# Patient Record
Sex: Female | Born: 1935 | Race: Black or African American | Hispanic: No | Marital: Married | Smoking: Never smoker
Health system: Southern US, Community
[De-identification: ages and names within clinical notes are randomized; demographics above are authoritative.]

## PROBLEM LIST (undated history)

## (undated) DIAGNOSIS — E785 Hyperlipidemia, unspecified: Secondary | ICD-10-CM

## (undated) DIAGNOSIS — C50912 Malignant neoplasm of unspecified site of left female breast: Principal | ICD-10-CM

## (undated) DIAGNOSIS — M1990 Unspecified osteoarthritis, unspecified site: Secondary | ICD-10-CM

## (undated) DIAGNOSIS — C50919 Malignant neoplasm of unspecified site of unspecified female breast: Secondary | ICD-10-CM

## (undated) DIAGNOSIS — Z5189 Encounter for other specified aftercare: Secondary | ICD-10-CM

## (undated) DIAGNOSIS — M545 Low back pain, unspecified: Secondary | ICD-10-CM

## (undated) DIAGNOSIS — M810 Age-related osteoporosis without current pathological fracture: Secondary | ICD-10-CM

## (undated) DIAGNOSIS — C801 Malignant (primary) neoplasm, unspecified: Secondary | ICD-10-CM

## (undated) DIAGNOSIS — I10 Essential (primary) hypertension: Secondary | ICD-10-CM

## (undated) DIAGNOSIS — IMO0001 Reserved for inherently not codable concepts without codable children: Secondary | ICD-10-CM

## (undated) DIAGNOSIS — K219 Gastro-esophageal reflux disease without esophagitis: Secondary | ICD-10-CM

## (undated) DIAGNOSIS — D0510 Intraductal carcinoma in situ of unspecified breast: Secondary | ICD-10-CM

## (undated) DIAGNOSIS — G8929 Other chronic pain: Secondary | ICD-10-CM

## (undated) HISTORY — PX: ROTATOR CUFF REPAIR: SHX139

## (undated) HISTORY — PX: MASTECTOMY: SHX3

## (undated) HISTORY — DX: Encounter for other specified aftercare: Z51.89

## (undated) HISTORY — DX: Age-related osteoporosis without current pathological fracture: M81.0

## (undated) HISTORY — DX: Gastro-esophageal reflux disease without esophagitis: K21.9

## (undated) HISTORY — DX: Hyperlipidemia, unspecified: E78.5

## (undated) HISTORY — DX: Unspecified osteoarthritis, unspecified site: M19.90

## (undated) HISTORY — DX: Malignant (primary) neoplasm, unspecified: C80.1

## (undated) HISTORY — DX: Other chronic pain: G89.29

## (undated) HISTORY — DX: Intraductal carcinoma in situ of unspecified breast: D05.10

## (undated) HISTORY — DX: Essential (primary) hypertension: I10

## (undated) HISTORY — DX: Reserved for inherently not codable concepts without codable children: IMO0001

## (undated) HISTORY — DX: Low back pain, unspecified: M54.50

## (undated) HISTORY — PX: TOTAL ABDOMINAL HYSTERECTOMY: SHX209

## (undated) HISTORY — DX: Low back pain: M54.5

## (undated) HISTORY — DX: Malignant neoplasm of unspecified site of left female breast: C50.912

---

## 1999-11-09 ENCOUNTER — Encounter: Admission: RE | Admit: 1999-11-09 | Discharge: 1999-11-09 | Payer: Self-pay | Admitting: Family Medicine

## 1999-11-09 ENCOUNTER — Encounter: Payer: Self-pay | Admitting: Family Medicine

## 2001-02-20 ENCOUNTER — Encounter: Payer: Self-pay | Admitting: Family Medicine

## 2001-02-20 ENCOUNTER — Encounter: Admission: RE | Admit: 2001-02-20 | Discharge: 2001-02-20 | Payer: Self-pay | Admitting: Family Medicine

## 2001-08-22 ENCOUNTER — Encounter: Payer: Self-pay | Admitting: Orthopedic Surgery

## 2001-08-22 ENCOUNTER — Encounter: Admission: RE | Admit: 2001-08-22 | Discharge: 2001-08-22 | Payer: Self-pay | Admitting: Orthopedic Surgery

## 2001-10-05 ENCOUNTER — Encounter: Admission: RE | Admit: 2001-10-05 | Discharge: 2001-10-05 | Payer: Self-pay | Admitting: Orthopedic Surgery

## 2001-10-05 ENCOUNTER — Encounter: Payer: Self-pay | Admitting: Orthopedic Surgery

## 2001-11-08 ENCOUNTER — Encounter: Payer: Self-pay | Admitting: Orthopedic Surgery

## 2001-11-08 ENCOUNTER — Encounter: Admission: RE | Admit: 2001-11-08 | Discharge: 2001-11-08 | Payer: Self-pay | Admitting: Orthopedic Surgery

## 2002-03-01 ENCOUNTER — Encounter: Admission: RE | Admit: 2002-03-01 | Discharge: 2002-03-01 | Payer: Self-pay | Admitting: Family Medicine

## 2002-03-01 ENCOUNTER — Encounter: Payer: Self-pay | Admitting: Family Medicine

## 2002-05-08 ENCOUNTER — Encounter: Payer: Self-pay | Admitting: Family Medicine

## 2002-05-08 ENCOUNTER — Encounter: Admission: RE | Admit: 2002-05-08 | Discharge: 2002-05-08 | Payer: Self-pay | Admitting: Family Medicine

## 2002-12-23 ENCOUNTER — Encounter: Admission: RE | Admit: 2002-12-23 | Discharge: 2002-12-23 | Payer: Self-pay | Admitting: Family Medicine

## 2003-05-18 ENCOUNTER — Ambulatory Visit (HOSPITAL_COMMUNITY): Admission: RE | Admit: 2003-05-18 | Discharge: 2003-05-18 | Payer: Self-pay | Admitting: Orthopedic Surgery

## 2003-11-03 ENCOUNTER — Encounter: Admission: RE | Admit: 2003-11-03 | Discharge: 2003-11-03 | Payer: Self-pay | Admitting: Family Medicine

## 2004-04-28 ENCOUNTER — Encounter: Admission: RE | Admit: 2004-04-28 | Discharge: 2004-04-28 | Payer: Self-pay | Admitting: Family Medicine

## 2004-05-17 ENCOUNTER — Inpatient Hospital Stay (HOSPITAL_COMMUNITY): Admission: RE | Admit: 2004-05-17 | Discharge: 2004-05-18 | Payer: Self-pay | Admitting: Neurosurgery

## 2004-11-02 ENCOUNTER — Encounter: Admission: RE | Admit: 2004-11-02 | Discharge: 2004-11-22 | Payer: Self-pay | Admitting: Neurosurgery

## 2005-09-07 ENCOUNTER — Encounter: Admission: RE | Admit: 2005-09-07 | Discharge: 2005-09-07 | Payer: Self-pay | Admitting: Family Medicine

## 2005-09-15 ENCOUNTER — Ambulatory Visit (HOSPITAL_COMMUNITY): Admission: RE | Admit: 2005-09-15 | Discharge: 2005-09-15 | Payer: Self-pay | Admitting: Neurosurgery

## 2005-11-24 ENCOUNTER — Emergency Department (HOSPITAL_COMMUNITY): Admission: EM | Admit: 2005-11-24 | Discharge: 2005-11-24 | Payer: Self-pay | Admitting: Family Medicine

## 2006-08-14 ENCOUNTER — Ambulatory Visit: Payer: Self-pay | Admitting: Gastroenterology

## 2006-08-25 ENCOUNTER — Encounter: Payer: Self-pay | Admitting: Gastroenterology

## 2006-08-25 ENCOUNTER — Ambulatory Visit: Payer: Self-pay | Admitting: Gastroenterology

## 2006-10-04 ENCOUNTER — Encounter: Admission: RE | Admit: 2006-10-04 | Discharge: 2006-10-04 | Payer: Self-pay | Admitting: Family Medicine

## 2006-11-23 ENCOUNTER — Encounter (INDEPENDENT_AMBULATORY_CARE_PROVIDER_SITE_OTHER): Payer: Self-pay | Admitting: Radiology

## 2006-11-23 ENCOUNTER — Encounter: Admission: RE | Admit: 2006-11-23 | Discharge: 2006-11-23 | Payer: Self-pay | Admitting: Family Medicine

## 2006-12-21 ENCOUNTER — Encounter: Admission: RE | Admit: 2006-12-21 | Discharge: 2006-12-21 | Payer: Self-pay | Admitting: General Surgery

## 2006-12-25 ENCOUNTER — Encounter: Admission: RE | Admit: 2006-12-25 | Discharge: 2006-12-25 | Payer: Self-pay | Admitting: General Surgery

## 2006-12-25 ENCOUNTER — Ambulatory Visit (HOSPITAL_BASED_OUTPATIENT_CLINIC_OR_DEPARTMENT_OTHER): Admission: RE | Admit: 2006-12-25 | Discharge: 2006-12-25 | Payer: Self-pay | Admitting: General Surgery

## 2006-12-25 ENCOUNTER — Encounter (INDEPENDENT_AMBULATORY_CARE_PROVIDER_SITE_OTHER): Payer: Self-pay | Admitting: General Surgery

## 2007-01-04 ENCOUNTER — Ambulatory Visit: Payer: Self-pay | Admitting: Oncology

## 2007-01-19 ENCOUNTER — Ambulatory Visit: Admission: RE | Admit: 2007-01-19 | Discharge: 2007-02-05 | Payer: Self-pay | Admitting: Radiation Oncology

## 2007-01-23 LAB — CBC WITH DIFFERENTIAL/PLATELET
BASO%: 0.9 % (ref 0.0–2.0)
Basophils Absolute: 0 10*3/uL (ref 0.0–0.1)
EOS%: 2.7 % (ref 0.0–7.0)
Eosinophils Absolute: 0.1 10*3/uL (ref 0.0–0.5)
HCT: 38.3 % (ref 34.8–46.6)
HGB: 13.4 g/dL (ref 11.6–15.9)
LYMPH%: 33.4 % (ref 14.0–48.0)
MCH: 31.1 pg (ref 26.0–34.0)
MCHC: 34.9 g/dL (ref 32.0–36.0)
MCV: 89.1 fL (ref 81.0–101.0)
MONO#: 0.5 10*3/uL (ref 0.1–0.9)
MONO%: 10.4 % (ref 0.0–13.0)
NEUT#: 2.6 10*3/uL (ref 1.5–6.5)
NEUT%: 52.6 % (ref 39.6–76.8)
Platelets: 269 10*3/uL (ref 145–400)
RBC: 4.3 10*6/uL (ref 3.70–5.32)
RDW: 12.8 % (ref 11.3–14.5)
WBC: 4.9 10*3/uL (ref 3.9–10.0)
lymph#: 1.6 10*3/uL (ref 0.9–3.3)

## 2007-01-23 LAB — COMPREHENSIVE METABOLIC PANEL
ALT: 13 U/L (ref 0–35)
AST: 17 U/L (ref 0–37)
Albumin: 4.2 g/dL (ref 3.5–5.2)
Alkaline Phosphatase: 73 U/L (ref 39–117)
BUN: 9 mg/dL (ref 6–23)
CO2: 28 mEq/L (ref 19–32)
Calcium: 9.5 mg/dL (ref 8.4–10.5)
Chloride: 103 mEq/L (ref 96–112)
Creatinine, Ser: 0.97 mg/dL (ref 0.40–1.20)
Glucose, Bld: 103 mg/dL — ABNORMAL HIGH (ref 70–99)
Potassium: 3.7 mEq/L (ref 3.5–5.3)
Sodium: 141 mEq/L (ref 135–145)
Total Bilirubin: 0.4 mg/dL (ref 0.3–1.2)
Total Protein: 7.1 g/dL (ref 6.0–8.3)

## 2007-01-23 LAB — LACTATE DEHYDROGENASE: LDH: 276 U/L — ABNORMAL HIGH (ref 94–250)

## 2007-02-15 HISTORY — PX: BACK SURGERY: SHX140

## 2007-02-15 HISTORY — PX: BREAST SURGERY: SHX581

## 2007-04-18 ENCOUNTER — Encounter: Admission: RE | Admit: 2007-04-18 | Discharge: 2007-04-18 | Payer: Self-pay | Admitting: Neurosurgery

## 2007-07-20 ENCOUNTER — Ambulatory Visit: Payer: Self-pay | Admitting: Oncology

## 2007-07-26 ENCOUNTER — Inpatient Hospital Stay (HOSPITAL_COMMUNITY): Admission: RE | Admit: 2007-07-26 | Discharge: 2007-07-30 | Payer: Self-pay | Admitting: Neurosurgery

## 2007-09-16 ENCOUNTER — Emergency Department (HOSPITAL_COMMUNITY): Admission: EM | Admit: 2007-09-16 | Discharge: 2007-09-17 | Payer: Self-pay | Admitting: Emergency Medicine

## 2007-11-26 ENCOUNTER — Encounter: Admission: RE | Admit: 2007-11-26 | Discharge: 2007-11-26 | Payer: Self-pay | Admitting: General Surgery

## 2007-12-04 ENCOUNTER — Encounter: Admission: RE | Admit: 2007-12-04 | Discharge: 2007-12-04 | Payer: Self-pay | Admitting: General Surgery

## 2007-12-07 ENCOUNTER — Encounter: Admission: RE | Admit: 2007-12-07 | Discharge: 2007-12-07 | Payer: Self-pay | Admitting: General Surgery

## 2007-12-07 ENCOUNTER — Encounter (INDEPENDENT_AMBULATORY_CARE_PROVIDER_SITE_OTHER): Payer: Self-pay | Admitting: Radiology

## 2007-12-27 ENCOUNTER — Ambulatory Visit (HOSPITAL_COMMUNITY): Admission: RE | Admit: 2007-12-27 | Discharge: 2007-12-27 | Payer: Self-pay | Admitting: General Surgery

## 2007-12-27 ENCOUNTER — Encounter (INDEPENDENT_AMBULATORY_CARE_PROVIDER_SITE_OTHER): Payer: Self-pay | Admitting: General Surgery

## 2007-12-27 ENCOUNTER — Encounter: Admission: RE | Admit: 2007-12-27 | Discharge: 2007-12-27 | Payer: Self-pay | Admitting: General Surgery

## 2008-01-07 ENCOUNTER — Ambulatory Visit (HOSPITAL_BASED_OUTPATIENT_CLINIC_OR_DEPARTMENT_OTHER): Admission: RE | Admit: 2008-01-07 | Discharge: 2008-01-07 | Payer: Self-pay | Admitting: General Surgery

## 2008-01-07 ENCOUNTER — Encounter (INDEPENDENT_AMBULATORY_CARE_PROVIDER_SITE_OTHER): Payer: Self-pay | Admitting: General Surgery

## 2008-01-16 ENCOUNTER — Ambulatory Visit (HOSPITAL_COMMUNITY): Admission: RE | Admit: 2008-01-16 | Discharge: 2008-01-16 | Payer: Self-pay | Admitting: General Surgery

## 2008-01-16 ENCOUNTER — Encounter (INDEPENDENT_AMBULATORY_CARE_PROVIDER_SITE_OTHER): Payer: Self-pay | Admitting: General Surgery

## 2008-02-13 ENCOUNTER — Ambulatory Visit: Payer: Self-pay | Admitting: Oncology

## 2008-02-14 ENCOUNTER — Ambulatory Visit: Admission: RE | Admit: 2008-02-14 | Discharge: 2008-04-17 | Payer: Self-pay | Admitting: Radiation Oncology

## 2008-02-18 LAB — COMPREHENSIVE METABOLIC PANEL
ALT: 12 U/L (ref 0–35)
AST: 15 U/L (ref 0–37)
Albumin: 4.6 g/dL (ref 3.5–5.2)
Alkaline Phosphatase: 74 U/L (ref 39–117)
BUN: 13 mg/dL (ref 6–23)
CO2: 28 mEq/L (ref 19–32)
Calcium: 10.1 mg/dL (ref 8.4–10.5)
Chloride: 102 mEq/L (ref 96–112)
Creatinine, Ser: 0.8 mg/dL (ref 0.40–1.20)
Glucose, Bld: 98 mg/dL (ref 70–99)
Potassium: 3.7 mEq/L (ref 3.5–5.3)
Sodium: 140 mEq/L (ref 135–145)
Total Bilirubin: 0.4 mg/dL (ref 0.3–1.2)
Total Protein: 7.3 g/dL (ref 6.0–8.3)

## 2008-02-18 LAB — CBC WITH DIFFERENTIAL/PLATELET
BASO%: 0.8 % (ref 0.0–2.0)
Basophils Absolute: 0 10*3/uL (ref 0.0–0.1)
EOS%: 0.7 % (ref 0.0–7.0)
Eosinophils Absolute: 0 10*3/uL (ref 0.0–0.5)
HCT: 35.8 % (ref 34.8–46.6)
HGB: 12.2 g/dL (ref 11.6–15.9)
LYMPH%: 37.6 % (ref 14.0–48.0)
MCH: 30 pg (ref 26.0–34.0)
MCHC: 34 g/dL (ref 32.0–36.0)
MCV: 88 fL (ref 81.0–101.0)
MONO#: 0.5 10*3/uL (ref 0.1–0.9)
MONO%: 11.8 % (ref 0.0–13.0)
NEUT#: 2.3 10*3/uL (ref 1.5–6.5)
NEUT%: 49.1 % (ref 39.6–76.8)
Platelets: 315 10*3/uL (ref 145–400)
RBC: 4.07 10*6/uL (ref 3.70–5.32)
RDW: 14.1 % (ref 11.3–14.5)
WBC: 4.6 10*3/uL (ref 3.9–10.0)
lymph#: 1.7 10*3/uL (ref 0.9–3.3)

## 2008-02-18 LAB — LACTATE DEHYDROGENASE: LDH: 155 U/L (ref 94–250)

## 2008-04-16 ENCOUNTER — Ambulatory Visit: Payer: Self-pay | Admitting: Oncology

## 2008-04-18 LAB — COMPREHENSIVE METABOLIC PANEL
ALT: 13 U/L (ref 0–35)
AST: 17 U/L (ref 0–37)
Albumin: 4.2 g/dL (ref 3.5–5.2)
Alkaline Phosphatase: 72 U/L (ref 39–117)
BUN: 11 mg/dL (ref 6–23)
CO2: 31 mEq/L (ref 19–32)
Calcium: 9.8 mg/dL (ref 8.4–10.5)
Chloride: 103 mEq/L (ref 96–112)
Creatinine, Ser: 0.7 mg/dL (ref 0.40–1.20)
Glucose, Bld: 89 mg/dL (ref 70–99)
Potassium: 3.6 mEq/L (ref 3.5–5.3)
Sodium: 143 mEq/L (ref 135–145)
Total Bilirubin: 0.5 mg/dL (ref 0.3–1.2)
Total Protein: 6.9 g/dL (ref 6.0–8.3)

## 2008-04-18 LAB — CBC WITH DIFFERENTIAL/PLATELET
BASO%: 0.9 % (ref 0.0–2.0)
Basophils Absolute: 0 10*3/uL (ref 0.0–0.1)
EOS%: 4 % (ref 0.0–7.0)
Eosinophils Absolute: 0.1 10*3/uL (ref 0.0–0.5)
HCT: 37.3 % (ref 34.8–46.6)
HGB: 12.7 g/dL (ref 11.6–15.9)
LYMPH%: 27.2 % (ref 14.0–49.7)
MCH: 28.9 pg (ref 25.1–34.0)
MCHC: 34 g/dL (ref 31.5–36.0)
MCV: 85 fL (ref 79.5–101.0)
MONO#: 0.6 10*3/uL (ref 0.1–0.9)
MONO%: 17 % — ABNORMAL HIGH (ref 0.0–14.0)
NEUT#: 1.6 10*3/uL (ref 1.5–6.5)
NEUT%: 50.9 % (ref 38.4–76.8)
Platelets: 206 10*3/uL (ref 145–400)
RBC: 4.39 10*6/uL (ref 3.70–5.45)
RDW: 14 % (ref 11.2–14.5)
WBC: 3.2 10*3/uL — ABNORMAL LOW (ref 3.9–10.3)
lymph#: 0.9 10*3/uL (ref 0.9–3.3)

## 2008-04-18 LAB — LACTATE DEHYDROGENASE: LDH: 151 U/L (ref 94–250)

## 2008-05-30 LAB — CBC WITH DIFFERENTIAL/PLATELET
BASO%: 0.8 % (ref 0.0–2.0)
Basophils Absolute: 0 10*3/uL (ref 0.0–0.1)
EOS%: 2.8 % (ref 0.0–7.0)
Eosinophils Absolute: 0.1 10*3/uL (ref 0.0–0.5)
HCT: 35.5 % (ref 34.8–46.6)
HGB: 12.2 g/dL (ref 11.6–15.9)
LYMPH%: 35 % (ref 14.0–49.7)
MCH: 30.2 pg (ref 25.1–34.0)
MCHC: 34.4 g/dL (ref 31.5–36.0)
MCV: 87.8 fL (ref 79.5–101.0)
MONO#: 0.5 10*3/uL (ref 0.1–0.9)
MONO%: 14.9 % — ABNORMAL HIGH (ref 0.0–14.0)
NEUT#: 1.6 10*3/uL (ref 1.5–6.5)
NEUT%: 46.5 % (ref 38.4–76.8)
Platelets: 222 10*3/uL (ref 145–400)
RBC: 4.05 10*6/uL (ref 3.70–5.45)
RDW: 15 % — ABNORMAL HIGH (ref 11.2–14.5)
WBC: 3.5 10*3/uL — ABNORMAL LOW (ref 3.9–10.3)
lymph#: 1.2 10*3/uL (ref 0.9–3.3)

## 2008-08-19 ENCOUNTER — Ambulatory Visit: Payer: Self-pay | Admitting: Oncology

## 2008-08-21 LAB — CBC WITH DIFFERENTIAL/PLATELET
BASO%: 0.9 % (ref 0.0–2.0)
Basophils Absolute: 0 10*3/uL (ref 0.0–0.1)
EOS%: 1.8 % (ref 0.0–7.0)
Eosinophils Absolute: 0.1 10*3/uL (ref 0.0–0.5)
HCT: 36.7 % (ref 34.8–46.6)
HGB: 12.8 g/dL (ref 11.6–15.9)
LYMPH%: 28.9 % (ref 14.0–49.7)
MCH: 31.5 pg (ref 25.1–34.0)
MCHC: 34.7 g/dL (ref 31.5–36.0)
MCV: 90.8 fL (ref 79.5–101.0)
MONO#: 0.5 10*3/uL (ref 0.1–0.9)
MONO%: 15.8 % — ABNORMAL HIGH (ref 0.0–14.0)
NEUT#: 1.6 10*3/uL (ref 1.5–6.5)
NEUT%: 52.6 % (ref 38.4–76.8)
Platelets: 198 10*3/uL (ref 145–400)
RBC: 4.04 10*6/uL (ref 3.70–5.45)
RDW: 14.4 % (ref 11.2–14.5)
WBC: 3 10*3/uL — ABNORMAL LOW (ref 3.9–10.3)
lymph#: 0.9 10*3/uL (ref 0.9–3.3)

## 2008-08-21 LAB — COMPREHENSIVE METABOLIC PANEL
ALT: 10 U/L (ref 0–35)
AST: 15 U/L (ref 0–37)
Albumin: 4.4 g/dL (ref 3.5–5.2)
Alkaline Phosphatase: 68 U/L (ref 39–117)
BUN: 10 mg/dL (ref 6–23)
CO2: 28 mEq/L (ref 19–32)
Calcium: 9.5 mg/dL (ref 8.4–10.5)
Chloride: 103 mEq/L (ref 96–112)
Creatinine, Ser: 0.74 mg/dL (ref 0.40–1.20)
Glucose, Bld: 98 mg/dL (ref 70–99)
Potassium: 3.6 mEq/L (ref 3.5–5.3)
Sodium: 141 mEq/L (ref 135–145)
Total Bilirubin: 0.6 mg/dL (ref 0.3–1.2)
Total Protein: 6.9 g/dL (ref 6.0–8.3)

## 2008-08-21 LAB — LACTATE DEHYDROGENASE: LDH: 155 U/L (ref 94–250)

## 2008-11-26 ENCOUNTER — Encounter: Admission: RE | Admit: 2008-11-26 | Discharge: 2008-11-26 | Payer: Self-pay | Admitting: General Surgery

## 2009-02-19 ENCOUNTER — Ambulatory Visit: Payer: Self-pay | Admitting: Oncology

## 2009-02-23 LAB — COMPREHENSIVE METABOLIC PANEL
ALT: 14 U/L (ref 0–35)
AST: 16 U/L (ref 0–37)
Albumin: 4.5 g/dL (ref 3.5–5.2)
Alkaline Phosphatase: 71 U/L (ref 39–117)
BUN: 8 mg/dL (ref 6–23)
CO2: 29 mEq/L (ref 19–32)
Calcium: 9.6 mg/dL (ref 8.4–10.5)
Chloride: 102 mEq/L (ref 96–112)
Creatinine, Ser: 0.74 mg/dL (ref 0.40–1.20)
Glucose, Bld: 97 mg/dL (ref 70–99)
Potassium: 3.4 mEq/L — ABNORMAL LOW (ref 3.5–5.3)
Sodium: 141 mEq/L (ref 135–145)
Total Bilirubin: 0.6 mg/dL (ref 0.3–1.2)
Total Protein: 6.9 g/dL (ref 6.0–8.3)

## 2009-02-23 LAB — CBC WITH DIFFERENTIAL/PLATELET
BASO%: 1 % (ref 0.0–2.0)
Basophils Absolute: 0 10*3/uL (ref 0.0–0.1)
EOS%: 2.7 % (ref 0.0–7.0)
Eosinophils Absolute: 0.1 10*3/uL (ref 0.0–0.5)
HCT: 39.5 % (ref 34.8–46.6)
HGB: 13.7 g/dL (ref 11.6–15.9)
LYMPH%: 35.7 % (ref 14.0–49.7)
MCH: 32.2 pg (ref 25.1–34.0)
MCHC: 34.8 g/dL (ref 31.5–36.0)
MCV: 92.7 fL (ref 79.5–101.0)
MONO#: 0.5 10*3/uL (ref 0.1–0.9)
MONO%: 13.4 % (ref 0.0–14.0)
NEUT#: 1.7 10*3/uL (ref 1.5–6.5)
NEUT%: 47.2 % (ref 38.4–76.8)
Platelets: 232 10*3/uL (ref 145–400)
RBC: 4.26 10*6/uL (ref 3.70–5.45)
RDW: 13.6 % (ref 11.2–14.5)
WBC: 3.6 10*3/uL — ABNORMAL LOW (ref 3.9–10.3)
lymph#: 1.3 10*3/uL (ref 0.9–3.3)

## 2009-02-23 LAB — LACTATE DEHYDROGENASE: LDH: 179 U/L (ref 94–250)

## 2009-08-20 ENCOUNTER — Ambulatory Visit: Payer: Self-pay | Admitting: Oncology

## 2009-11-27 ENCOUNTER — Encounter: Admission: RE | Admit: 2009-11-27 | Discharge: 2009-11-27 | Payer: Self-pay | Admitting: Family Medicine

## 2010-02-23 ENCOUNTER — Ambulatory Visit: Payer: Self-pay | Admitting: Oncology

## 2010-02-25 LAB — CBC WITH DIFFERENTIAL/PLATELET
BASO%: 0.9 % (ref 0.0–2.0)
Basophils Absolute: 0 10*3/uL (ref 0.0–0.1)
EOS%: 2.4 % (ref 0.0–7.0)
Eosinophils Absolute: 0.1 10*3/uL (ref 0.0–0.5)
HCT: 36.7 % (ref 34.8–46.6)
HGB: 12.7 g/dL (ref 11.6–15.9)
LYMPH%: 32.7 % (ref 14.0–49.7)
MCH: 31.6 pg (ref 25.1–34.0)
MCHC: 34.6 g/dL (ref 31.5–36.0)
MCV: 91.4 fL (ref 79.5–101.0)
MONO#: 0.5 10*3/uL (ref 0.1–0.9)
MONO%: 12.5 % (ref 0.0–14.0)
NEUT#: 2.1 10*3/uL (ref 1.5–6.5)
NEUT%: 51.5 % (ref 38.4–76.8)
Platelets: 238 10*3/uL (ref 145–400)
RBC: 4.01 10*6/uL (ref 3.70–5.45)
RDW: 14 % (ref 11.2–14.5)
WBC: 4.1 10*3/uL (ref 3.9–10.3)
lymph#: 1.3 10*3/uL (ref 0.9–3.3)

## 2010-02-25 LAB — COMPREHENSIVE METABOLIC PANEL
ALT: 12 U/L (ref 0–35)
AST: 18 U/L (ref 0–37)
Albumin: 4.6 g/dL (ref 3.5–5.2)
Alkaline Phosphatase: 63 U/L (ref 39–117)
BUN: 13 mg/dL (ref 6–23)
CO2: 31 mEq/L (ref 19–32)
Calcium: 9.6 mg/dL (ref 8.4–10.5)
Chloride: 100 mEq/L (ref 96–112)
Creatinine, Ser: 0.84 mg/dL (ref 0.40–1.20)
Glucose, Bld: 101 mg/dL — ABNORMAL HIGH (ref 70–99)
Potassium: 3.8 mEq/L (ref 3.5–5.3)
Sodium: 139 mEq/L (ref 135–145)
Total Bilirubin: 0.5 mg/dL (ref 0.3–1.2)
Total Protein: 7.2 g/dL (ref 6.0–8.3)

## 2010-02-25 LAB — LACTATE DEHYDROGENASE: LDH: 168 U/L (ref 94–250)

## 2010-03-07 ENCOUNTER — Encounter: Payer: Self-pay | Admitting: General Surgery

## 2010-06-14 ENCOUNTER — Inpatient Hospital Stay (INDEPENDENT_AMBULATORY_CARE_PROVIDER_SITE_OTHER)
Admission: RE | Admit: 2010-06-14 | Discharge: 2010-06-14 | Disposition: A | Discharge status: Home / Self Care | Payer: Medicare Other | Source: Ambulatory Visit | Attending: Family Medicine | Admitting: Family Medicine

## 2010-06-14 DIAGNOSIS — J069 Acute upper respiratory infection, unspecified: Secondary | ICD-10-CM

## 2010-06-14 LAB — POCT RAPID STREP A (OFFICE): Streptococcus, Group A Screen (Direct): NEGATIVE

## 2010-06-29 NOTE — Op Note (Signed)
Judy [name], Judy [name]                  ACCOUNT NO.:  [id number]   MEDICAL RECORD NO.:  [id number]          PATIENT TYPE:  AMB   LOCATION:  DSC                          FACILITY:  [facility]   PHYSICIAN:  Lennie Muckle, MD      DATE OF BIRTH:  1930/11/18   DATE OF PROCEDURE:  01/07/2008  DATE OF DISCHARGE:                               OPERATIVE REPORT   PREOPERATIVE DIAGNOSIS:  Ductal carcinoma in situ, left breast, status  post excisional biopsy with positive margin.   POSTOPERATIVE DIAGNOSIS:  Ductal carcinoma in situ, left breast, status  post excisional biopsy with positive margin.   PROCEDURE:  Re-excision, left breast tissue.   SURGEON:  Lennie Muckle, MD   ASSISTANT:  General endotracheal anesthesia.   FINDINGS:  Seroma in the previous excisional biopsy cavity.  Specimen  was excised, oriented with long lateral and short superior.   INDICATIONS FOR PROCEDURE:  Ms. Schussler is a 77 year old female who had a  lumpectomy by Dr. Lorelee New approximately 1 year ago.  She had a  followup mammogram, which showed new calcifications of the left breast.  Core biopsy was inconclusive, therefore I took her to the operating room  for an open biopsy using a needle as localization.  The pathology did  reveal DCIS, high-grade with necrosis.  It did extend into the medial  margin, close margin posterior and lateral, less than 0.1 cm.  I talked  to the patient about for performing re-excision of the left breast  tissue and she would need breast radiation.  I did offer mastectomy.  However, we felt we could treat this with a lumpectomy.  Informed  consent was obtained prior to the procedure.   DETAILS OF PROCEDURE:  Ms. Baker was identified in the preop holding  area.  Her left breast was marked by me.  She received 2 g of Kefzol and  was taken to the operating.  Once in the operating room, she was placed  in supine position.  After administration of general endotracheal  anesthesia, her left  breast was prepped and draped in the usual sterile  fashion.  A time-out procedure was obtained indicating patient and  procedure were performed.  I placed an incision directly through the  previous incision site.  Subcutaneous tissue was divided with  electrocautery.  I was able to encounter the previous biopsy cavity.  Serous fluid was evacuated.  I then used Allis clamp and re-excised the  breast tissue focusing medially, laterally, and posteriorly.  I was able  to incorporate the clips from the previous biopsy.  I marked this  specimen oriented long lateral and short with superior on the suture.  I  did mark this specimen deep with a short double suture.  I went onto the  chest wall just above the fascia of the pectoralis muscle.  I then  irrigated the breast tissue.  There was a small vessel and counted it  medially and superiorly.  This was tied off with a 3-0 Vicryl suture.  I  then reexcised the smaller portion around 5  o'clock using  electrocautery.  This was also oriented with the suture mark on the most  lateral border.  The breast cavity was irrigated.  A small amount oozing  was controlled with electrocautery.  Upon final inspection, there  appeared to be no bleeding. I did mark the cavity with clips to identify  the outer borders.  I then closed this in layered fashion using a 3-0  Vicryl suture.  Skin was closed with 4-0 Monocryl.  I then injected 30  mL of Marcaine into the breast excision site.  Dermabond was placed for  final dressing.  The patient was extubated and transferred to  Postanesthesia Care Unit in stable condition.   She will be discharged home with Darvocet-N 100 to take one-half to one  every 4-6 hours p.r.n. for pain.  She will follow up with me in  approximately 2-3 weeks and I will call her with the final pathology  when it is available.      Lennie Muckle, MD  Electronically Signed     ALA/MEDQ  D:  01/07/2008  T:  01/07/2008  Job:  [id number]    cc:   Donia Guiles, M.D.  Samul Dada, M.D.  Artist Pais Kathrynn Running, M.D.

## 2010-06-29 NOTE — H&P (Signed)
NAMESALOTE, Judy [name]                  ACCOUNT NO.:  [id number]   MEDICAL RECORD NO.:  [id number]          PATIENT TYPE:  INP   LOCATION:  3006                         FACILITY:  [facility]   PHYSICIAN:  Hewitt Shorts, M.D.DATE OF BIRTH:  09-Nov-1930   DATE OF ADMISSION:  07/26/2007  DATE OF DISCHARGE:                              HISTORY & PHYSICAL   HISTORY OF PRESENT ILLNESS:  The patient is a 77 year old right-handed  black female who has been patient of mine for over 3 years.  She had  presented with a lumbar radiculopathy in March 2006 and was found to  have a right L5-S1 extraforaminal disk herniation.  She underwent a  right L5-S1 extraforaminal microdiskectomy in April 2006 but  subsequently redeveloped radicular pain and was found by workup with x-  rays and MRI scan to have right L5-S1 neuroforaminal stenosis with  compression of the extending right L5 nerve roots.  She underwent number  of series of epidural steroid injection.  The injections at the  beginning gave her good relief but subsequent series of injections gave  her less and less relief, and at this point, the patient has disabling  pain extending through the right lower extremity from the right buttock  into the lateral right thigh and anterior right leg.  It limits her day-  to-day activities at home.  She rests poorly at night.  She was  restudied with x-rays and MRI scan earlier this year with severe right  L5-S1 lateral recess and neuroforaminal stenosis again identified and  because of the persistent pain discomfort, the patient was admitted now  for bilateral L5-S1 lumbar decompression, posterior lumbar interbody  fusion, and posterolateral arthrodesis.   PAST MEDICAL HISTORY:  Notable for history of hypertension,  hypercholesteremia, a benign abdominal tumor that was resected, but no  history of myocardial infarction, cancer, stroke, diabetes, peptic ulcer  disease, and lung disease.   Previous surgery  includes laparotomy for [facility] tumor, hysterectomy,  bilateral shoulder surgery, and her right L5-S1 extraforaminal micro  discectomy.   ALLERGIES:  She does not have allergies to medications.   MEDICATIONS AT THIS TIME:  Include amlodipine and Zocor as well as pain  medication.   FAMILY HISTORY:  The parents passed on.   SOCIAL HISTORY:  The patient is retired.  She is married.  She does not  smoke, drink alcoholic beverages, or have a history of substance abuse.   REVIEW OF SYSTEMS:  Notable for those described in the history of  present illness and past medical history, but a 14-point review of  system sheet is otherwise unremarkable.   PHYSICAL EXAMINATION:  GENERAL:  The patient is a well-developed and  well-nourished black female in no acute distress.  VITAL SIGNS:  Temperature 98, pulse 68, blood pressure 159/83,  respiratory rate 16, height 5 feet 2 inches, and weight 80 kg.  LUNGS:  Clear to auscultation.  She has symmetrical respiratory  excursion.  HEART:  Regular rate and rhythm.  Normal S1 and S2.  There is no murmur.  ABDOMEN:  Soft and nondistended.  Bowel sounds are present.  EXTREMITIES:  Shows no clubbing, cyanosis, or edema.  MUSCULOSKELETAL:  Shows no tensor fascia of the lumbar spinous process  or paralumbar musculature.  Straight leg raising is negative  bilaterally.  She does have flexion to 90 degrees.  She does extend  well.  NEUROLOGIC:  Shows 5/5 strength in the dorsiflexion and plantar flexion  bilaterally as well as in the left extensor hallucis longus.  The right  extensor hallucis longus has 3 to 4-/5.  Sensation is intact to pinprick  in the distal lower extremities.  Reflexes are diminished in the distal  lower extremities.   IMPRESSION:  The patient with significant degenerative disk disease and  spondylosis at the L5-S1 level with bilateral neuroforaminal stenosis,  right significantly more severe than left, corresponding to her right   lumbar radiculopathy.   PLAN:  The patient will be admitted for a bilateral L5-S1 lumbar  decompression including laminotomy, facetectomy, and foraminotomy with  decompression of the exiting L5 and S1 nerve roots bilaterally.  Bilateral L5-S1 posterior lumbar interbody fusion and posterolateral  arthrodesis with interbody implants and post instrumentation and bone  graft.  We discussed the nature of her condition and nature and extent  of her surgery using her x-rays, MRI scan, and models in the office.  We  discussed steps of the surgery; hospital stay; overall recuperation;  limitations postoperatively; need for postoperative immobilization in a  lumbar brace; risks including the risks of infection, bleeding, and  possibility of transfusion; the risk of nerve root dysfunction, pain,  weakness, numbness, paresthesias; the risk of failure of the arthrodesis  and possible need for further surgery; the risks of dural tear or CSF  leakage; possibility of further surgery; and anesthetic risks of  myocardial infarction, stroke, pneumonia, and death.  Understanding all  of these, she wishes to go ahead with surgery, and she is admitted for  such.      Hewitt Shorts, M.D.  Electronically Signed     RWN/MEDQ  D:  07/27/2007  T:  07/27/2007  Job:  [id number]

## 2010-06-29 NOTE — Op Note (Signed)
[name], Judy [name]                  ACCOUNT NO.:  [id number]   MEDICAL RECORD NO.:  [id number]          PATIENT TYPE:  AMB   LOCATION:  SDS                          FACILITY:  [facility]   PHYSICIAN:  Lennie Muckle, MD      DATE OF BIRTH:  Jun 17, 1930   DATE OF PROCEDURE:  12/27/2007  DATE OF DISCHARGE:  12/27/2007                               OPERATIVE REPORT   PREOPERATIVE DIAGNOSIS:  New left breast calcifications with history of  ductal carcinoma in situ.   POSTOPERATIVE DIAGNOSIS:  New left breast calcifications with history of  ductal carcinoma in situ.   PROCEDURE:  Needle-localized left breast biopsy.   SURGEON:  Amber L. Freida Busman, MD   ASSISTANT:  Kelle Darting. Rennis Harding, NP   FINDINGS:  Biopsy cavity with old blood.  A wire within the breast  tissue.   SPECIMEN:  Left breast tissue with wire to [facility] Center and then the  Pathology.   ESTIMATED BLOOD LOSS:  Minimal amount of blood loss.   COMPLICATIONS:  No immediate complications.   DRAINS:  No drains were placed.   INDICATIONS FOR PROCEDURE:  [name] is a pleasant 77 year old female  who had had a previous left breast lumpectomy performed by Dr. Lorelee New in November 2008.  She had a high-grade component.  She did not  receive breast radiation treatment.  On mammogram on November 26, 2007,  there was a new area of linear calcifications within the previous biopsy  sites.  She had had inflammation, but no malignant cells seen.  Due to  her history of DCIS, I felt that the biopsy might not be congruent with  her calcifications.  I discussed with the patient having a needle-  localized breast biopsy.  I was concerned she had had a recurrence of  her DCIS.  I discussed with the patient the surgery, then she was  willing to proceed.  Informed consent was obtained prior to procedure.   DETAILS OF PROCEDURE:  [name] was identified in the preoperative  holding area.  I marked her left breast and she had the wire within  the  left breast, which was placed at the Breast Center.  She received 1 g of  Kefzol and was taken to the operating room.  Once in the operating room,  placed in the supine position.  After administration of general  endotracheal anesthesia, the left breast was prepped and draped in usual  sterile fashion.  The wire was clipped.  I placed an incision just  beneath the wire with a #15 blade.  I created a skin flap superiorly and  incorporated the wire into the wound bed.  I then continued to follow  the wire and grasped the breast tissue with an Allis clamp.  I then  continued dissecting out the breast tissue with electrocautery.  I  encountered an old biopsy site medially.  Old blood was seen.  I  completed the dissection with electrocautery incorporating distal to the  wire tip.  The specimen was removed that was oriented with the wire  being superiorly.  I marked a short single as the anterior long lateral  and double short with deep markings.  Specimen was then sent for  Radiology for review.  This specimen did incorporate the previous clip  and the wire, and the calcifications were present.  I irrigated the  wound bed.  Electrocautery was used to control small amount of oozing.  Final irrigation revealed no bleeding.  I reapproximated the breast  tissue with 3-0 Vicryl suture.  The dermis was closed with 3-0 Vicryl.  Epidermis was closed with 4-0 Monocryl.  Steri-Strips was placed for  final dressing.  The patient was extubated and transferred to Post  Anesthesia Care Unit in stable condition.  I will have her follow up  with me in approximately 2-3 weeks.  I will call her as soon as I have  the biopsy results from Pathology.      Lennie Muckle, MD  Electronically Signed     ALA/MEDQ  D:  12/27/2007  T:  12/27/2007  Job:  [id number]   cc:   Samul Dada, M.D.  Artist Pais Kathrynn Running, M.D.  Donia Guiles, M.D.

## 2010-06-29 NOTE — Discharge Summary (Signed)
[name], [name]                  ACCOUNT NO.:  [id number]   MEDICAL RECORD NO.:  [id number]          PATIENT TYPE:  INP   LOCATION:  3006                         FACILITY:  [facility]   PHYSICIAN:  Hewitt Shorts, M.D.DATE OF BIRTH:  1930/03/02   DATE OF ADMISSION:  07/26/2007  DATE OF DISCHARGE:  07/30/2007                               DISCHARGE SUMMARY   HISTORY:  The patient is a 77 year old woman who presented with right  lumbar radiculopathy.  She had severe right L5-S1 neural foraminal  stenosis with advanced degeneration at the L5-S1 level.  A decision was  made to proceed with decompression arthrodesis.  General examination was  unremarkable.  Neurologic examination with good strength.  Further  details of the admission history and physical examination included in  her admission note.   HOSPITAL COURSE:  The patient was admitted, underwent a bilateral L5-S1  lumbar laminotomy, facetectomy, foraminotomy and bilateral L5-S1  posterior lumbar interbody infusion with interbody implants and bone  graft and a bilateral L5-S1 posterolateral arthrodesis with radius  posterior  instrumentation and bone graft.  Postoperatively, she did  quite well.  We were able to discontinue the PCA on the evening of  surgery.  She was maintained on Percocet.  She was able to gradually  advance her activities and ambulation.  She had excellent relief of the  radicular pain.  Her wound is healed nicely.  She did have some  postoperative fever to 102.5.  She was treated with incentive spirometry  and hand-held nebulizer and improved and at this time is afebrile.  She  has been given instruction regarding wound care for followup.  She is to  return in 2 days for staple removal at the office.  She was given a  prescription for Percocet  1 to 2  tablets q.4-6 p.r.n. pain, 50 tablets  and no refills, and she is  to resume her usual home medications.   DISCHARGE DIAGNOSES:  1. Lumbar stenosis.  2.  Lumbar spondylosis.  3. Lumbar degenerative disk disease.  4. Lumbar radiculopathy.      Hewitt Shorts, M.D.  Electronically Signed     RWN/MEDQ  D:  07/30/2007  T:  07/30/2007  Job:  [id number]

## 2010-06-29 NOTE — Op Note (Signed)
[name], [name]                  ACCOUNT NO.:  [id number]   MEDICAL RECORD NO.:  [id number]          PATIENT TYPE:  INP   LOCATION:  3006                         FACILITY:  [facility]   PHYSICIAN:  Hewitt Shorts, M.D.DATE OF BIRTH:  03-Jan-1931   DATE OF PROCEDURE:  07/26/2007  DATE OF DISCHARGE:                               OPERATIVE REPORT   PREOPERATIVE DIAGNOSIS:  1. Lumbar stenosis.  2. Lumbar spondylosis.  3. Lumbar degenerative disk disease.  4. Lumbar radiculopathy.   POSTOPERATIVE DIAGNOSIS:  1. Lumbar stenosis.  2. Lumbar spondylosis.  3. Lumbar degenerative disk disease.  4. Lumbar radiculopathy.   PROCEDURE:  Bilateral L5-S1 lumbar decompression with laminotomy,  facetectomy, and foraminotomy with decompression of the L5 and S1 nerve  roots bilaterally with microdissection, bilateral L5-S1 posterior lumbar  interbody infusion with AVS PEEK interbody implants and mosaic with bone  marrow aspirate and bilateral L5-S1 posterolateral arthrodesis with  radius posterior instrumentation and mosaic with bone marrow aspirate.   SURGEON:  Hewitt Shorts, M.D.   ASSISTANT:  1. Judy Shi. Webb Silversmith, NP.  2. Judy [name], M.D.   ANESTHESIA:  General endotracheal.   INDICATIONS:  The patient is a 77 year old woman.  She is status post  previous right L5-S1 extraforaminal microdiscectomy over 3 years ago.  She has had persistent difficulties with right lumbar radicular pain.  She has extensive degenerative changes throughout the lumbar spine with  degenerative scoliosis and particular right L5-S1 neuroforaminal  stenosis, compression of the exiting right L5 nerve root.  A decision  was made to proceed with decompression and stabilization.   PROCEDURE:  The patient was brought to the operating room and placed  under general endotracheal anesthesia.  The patient was turned to a  prone position.  Lumbar region was prepped with Betadine soap and  solution and draped in a  sterile fashion.  The midline was infiltrated  with local anesthetic with epinephrine.  A midline incision was made  after x-rays was taken to localize the L5-S1 level.  Dissection was  carried down through the subcutaneous tissue.  Bipolar electrocautery  was used to maintain hemostasis.  Dissection was carried down to the  lumbar fascia, which was incised bilaterally in the paraspinal muscles,  with dissection of the spinous process and lamina in a subperiosteal  fashion.  A self-retaining retractor was placed and an x-ray was taken  and we identified the L5-S1 intralaminar space.  Dissection was then  carried out bilaterally exposing the ala of S1 and transverse processes  of L5 and then the decompression was performed.   Using combination of loop, magnification, and the operating microscope,  we performed bilateral L5-S1 lumbar laminotomy and facetectomy.  We  carefully removed the ligamentum flavum and decompressive thecal sac.  We identified the exiting L5 and S1 nerve roots bilaterally and  foraminotomies performed for the S1 nerve roots bilaterally and then  dissection was carried out laterally into the L5-S1 neural foramen.  With particular attention paid to the right side, we identified the  exiting right L5 nerve root and spondylytic overgrowth including  both  bony and ligamentous overgrowth was carefully removed decompressing the  exiting L5 nerve root.  We similarly decompressed the left L5 nerve  roots and then proceeded with the interbody arthrodesis.   The annulus was identified bilaterally.  The overlying epidural veins  were coagulated, divided and we incised the annulus, entered into disk  space bilaterally.  Proceeding with thorough discectomy bilaterally  using a variety of microcurettes and pituitary rongeurs.  We began to  prepare the endplates of vertebral body using paddle curettes removing  the cauterized endplates surface down to a good bony surface.  We   measured the heights of intervertebral disc space and because of  significant fish mouthing of the L5-S1 disk space selected interbody  implants 11 mm in height, but with 8 degrees of lordosis.   The C-arm fluoroscope was then draped and brought in the field and we  identified the right S1 pedicle.  It was probed and then we were able to  aspirate bone aspirate from the vertebral body, injected over a 15 mL  strip of mosaic.  We then packed the interbody implants with mosaic with  bone marrow aspirate and then carefully retracted the thecal sac and  nerve root, placed the first implant on the right side.  We packed  additional mosaic with bone marrow aspirate in the midline of the  intravertebral disc space as well as lateral to right-sided implants and  then we placed left implant again carefully retracting the thecal sac  and nerve roots.  Both implants were countersunk.  We then proceeded  with the posterolateral arthrodesis.   With C-arm fluoroscopic guidance, we identified pedicle entry sites for  L5 bilaterally as well as the left S1 site.  Each of the 4 pedicles was  probed.  We examined with ball probe; good bony surfaces were noted,  each was tapped with 5.25 mm tap.  We placed 5.75 mm screws, placing a  35-mm screw on the right at S1 and 30-mm screw on the left at S1, and 45-  mm screws bilaterally at L5.  Once all 4 screws were in place, we packed  additional mosaic with bone marrow aspirate in the lateral gutter over  the transverse process and ala, intertransverse space.  We then selected  a 30-mm prelordosed rod for the right side and a 35-mm prelordosed rod  for the left side.  Each rod was secured to the screw head with locking  caps and subsequently all 4 locking caps were tightened against a  counter torque.  Once all 4 locking caps were secured and was irrigated  extensively with Bacitracin solution, good deal of pressure was  reconfirmed of the thecal sac and exiting  nerve roots.  Good hemostasis  was established with the use of Gelfoam soaked in thrombin and then we  proceed with closure.  The deep fascia was closed with interrupted  undyed #1 Vicryl sutures.  The Scarpa fascia was closed with interrupted  undyed #1 Vicryl sutures.  The subcutaneous and subcuticular were closed  with interrupted inverted 2-0 undyed Vicryl sutures.  The skin edges  were approximated with surgical staples.  The wound was dressed with  Adaptic and sterile gauze.  The procedure was tolerated well.  The  estimated blood loss was 300 mL.  We did use a cell saver and we were  able to 50 mL of cell saver blood to the patient.  Sponge and needle  count were correct.  Following surgery,  the patient was returned back to  the supine position, reversed from the anesthetic, extubated, and  transferred to the recovery room for further care.      Hewitt Shorts, M.D.  Electronically Signed     RWN/MEDQ  D:  07/26/2007  T:  07/27/2007  Job:  [id number]

## 2010-06-29 NOTE — Op Note (Signed)
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   PROCEDURE:  Re-excision of lateral margin.   SURGEON:  Lennie Muckle, MD.   ASSISTANT:  None.   ANESTHESIA:  General endotracheal anesthesia.   FINDINGS:  I re-excised the lateral margin just around the area of  pectoralis muscle.  I also excised the inferior medial margin.  The  specimen was oriented with a long lateral suture, short single superior  double short is deep and a clip was placed at the medial margin.  Minimal amount of blood loss.  No immediate complications.   INDICATIONS FOR PROCEDURE:  Judy [name] is a 77 year old female I had  excised an excisional biopsy of a specimen which was positive for DCIS.  The margins were positive therefore I had taken her back for re-  excision.  The second specimen had a close margin at less than 1 mm  therefore I discussed with the patient performing re-excision of the  lateral margin.  Informed consent was obtained prior to the procedure.   PROCEDURE IN DETAIL:  Judy [name] was identified in the preoperative  holding area.  I marked the left side of her chest.  She received 2 g of  Kefzol and was taken to the operating room.  Her left anterior chest was  prepped and draped in the usual sterile fashion.  A time-out procedure  indicating the patient and procedure were performed.  I incised the skin  in the previous incision site since this had been twice incised.  Using  a 15 blade I  incised around the previous incision site.  I found the  cavity easily and there was a large amount of seroma which was  evacuated.  I then found my lateral border of the margin.  It was  clamped, marked with clips.  I grasped this with Allis clamps and using  electrocautery was able to remove the specimen.  The specimen was  oriented with a long lateral suture short with superior and I placed a  clip on the medial margin.  I did just go laterally to the pectoralis  muscle.  I excised approximately 3 cm of tissue.  The specimen was  likely 5 cm long and 3 cm in width.  The tissues were irrigated after  removal of the specimen.  Bleeding controlled with electrocautery.  Final irrigation revealed no evidence of bleeding.  The cavity I  attempted to close with 3-0 Vicryl suture.  Dermis was closed with 3-0  Vicryl and skin was closed with  4-0 Monocryl.  I then injected  approximately 30 mL of a mixture of Marcaine and lidocaine into the  skin.  Steri-Strips were placed for final dressing.  The patient was  then extubated and taken to post anesthesia care unit in stable  condition.  I will call her with her final results and then recommend  radiation following this re-excision.      Lennie Muckle, MD  Electronically Signed     ALA/MEDQ  D:  01/16/2008  T:  01/16/2008  Job:  [id number]

## 2010-06-29 NOTE — Op Note (Signed)
NAME:  Judy [name], Judy [name]                  ACCOUNT NO.:  [id number]   MEDICAL RECORD NO.:  [id number]          PATIENT TYPE:  AMB   LOCATION:  DSC                          FACILITY:  [facility]   PHYSICIAN:  Timothy E. Earlene Plater, M.D. DATE OF BIRTH:  [birth year]   DATE OF PROCEDURE:  12/25/2006  DATE OF DISCHARGE:                               OPERATIVE REPORT   PREOPERATIVE DIAGNOSIS:  Abnormal calcifications, left breast.   POSTOPERATIVE DIAGNOSIS:  Abnormal calcifications, left breast.   OPERATION PERFORMED:  Needle-localized excision.   SURGEON:  Timothy E. Earlene Plater, M.D.   ANESTHESIA:  Local with standby.   INDICATIONS FOR THE SURGERY:  The patient was having routine mammograms  when calcifications were noted.  A core biopsy was made that was not  concordant with the mammogram, so an open complete excision was advised.  The patient agreed and understood.  [facility] where a needle localization was accomplished.  The patient  was seen, identified and the left breast marked.   DESCRIPTION OF THE OPERATION:  The patient was brought to the operating  room, placed supine and IV sedation was given.  The left breast was  examined.  The tape excess wire cut were away.  The wire entered the  breast at the 2 o'clock position and proceeded slightly posteriorly and  medially from there.  The breast was prepped and draped in usual  fashion.  One-quarter percent Marcaine with epinephrine was used for  local anesthesia.  A curvilinear incision was made around the entrance  of the needle shaft.  It was followed down through the soft fatty  subcutaneous tissue to the actual substance of the breast.  A clamp was  placed on the breast tissue around the wire and a generous core of  tissue was removed with the wire.  Because the tissue on the anterior  surface above the wire was tougher I went back and removed a separate  specimen there.  This was all submitted for routine  specimen  mammography.   Bleeding was controlled.  The wound was closed in layers with Vicryl.   COUNTS:  All counts were correct.   CONDITION:  The patient tolerated it well.   The radiologist called back stating that the calcifications, wire and  marker were located within the specimen. It will now be sent to  pathology.   DISPOSITION:  The patient was removed to the recovery room.  Written and  verbal instructions were given.  She will be seen and followed up in the  office.      Timothy E. Earlene Plater, M.D.  Electronically Signed     TED/MEDQ  D:  12/25/2006  T:  12/26/2006  Job:  [id number]   cc:   Donia Guiles, M.D.  [facility]

## 2010-07-01 IMAGING — MG MM SCREEN MAMMOGRAM BILATERAL
4 series · 4 of 4 positions shown · non-contrast
Comparison: none

DG SCREEN MAMMOGRAM BILATERAL
Bilateral CC and MLO view(s) were taken.
Technologist: Heng Wille

DIGITAL SCREENING MAMMOGRAM WITH CAD:
There are scattered fibroglandular densities.  Microcalcifications are present in the left breast. 
Characterization with magnification views is recommended.  No mass or malignant type 
calcifications are identified in the right breast.  Compared with prior studies.

[R CC]
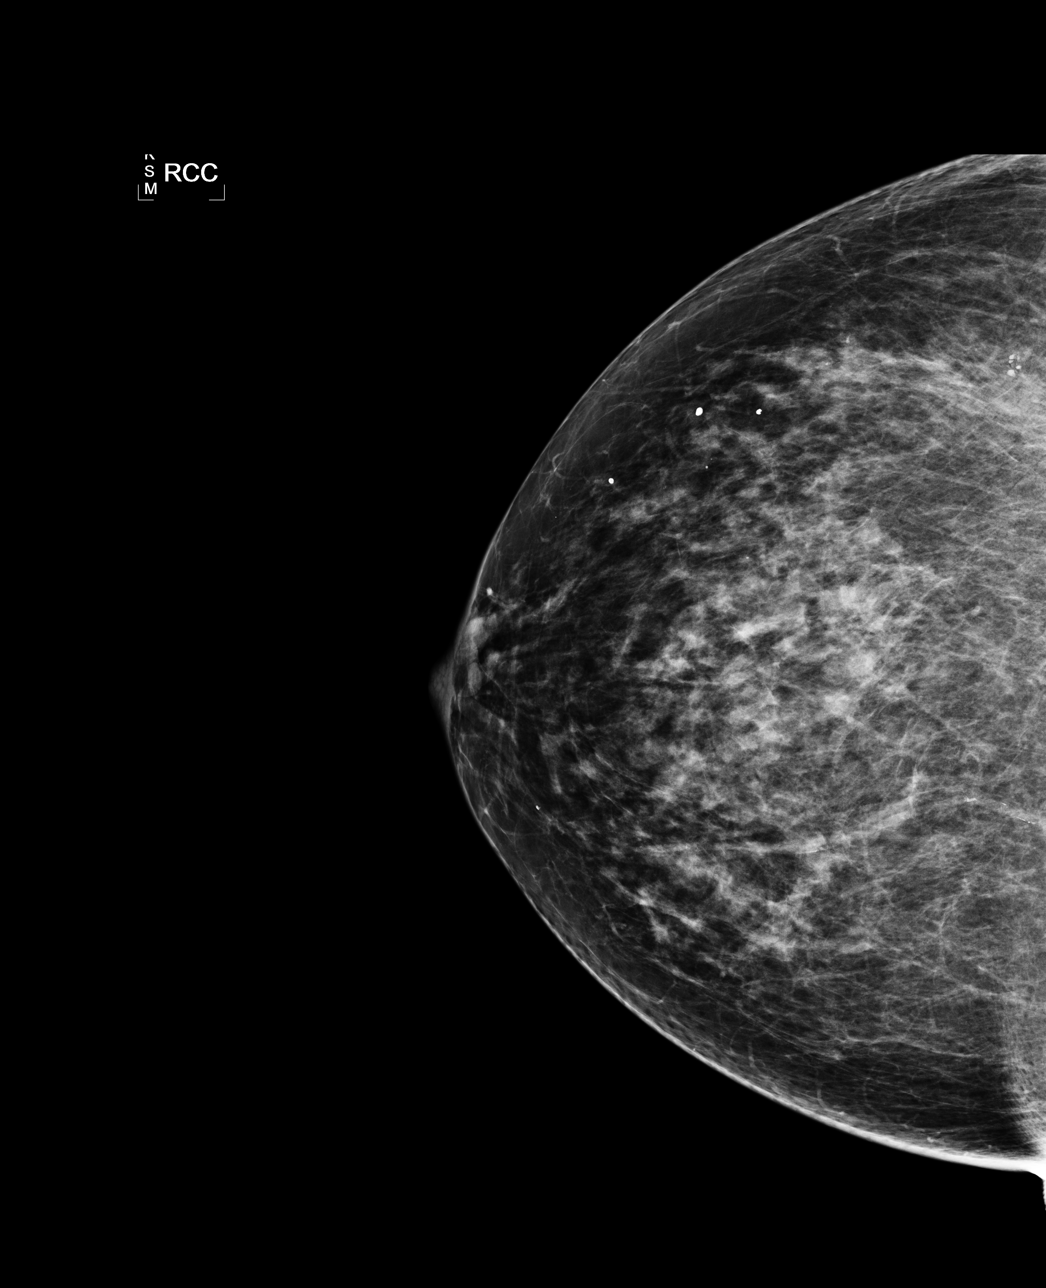

[L CC]
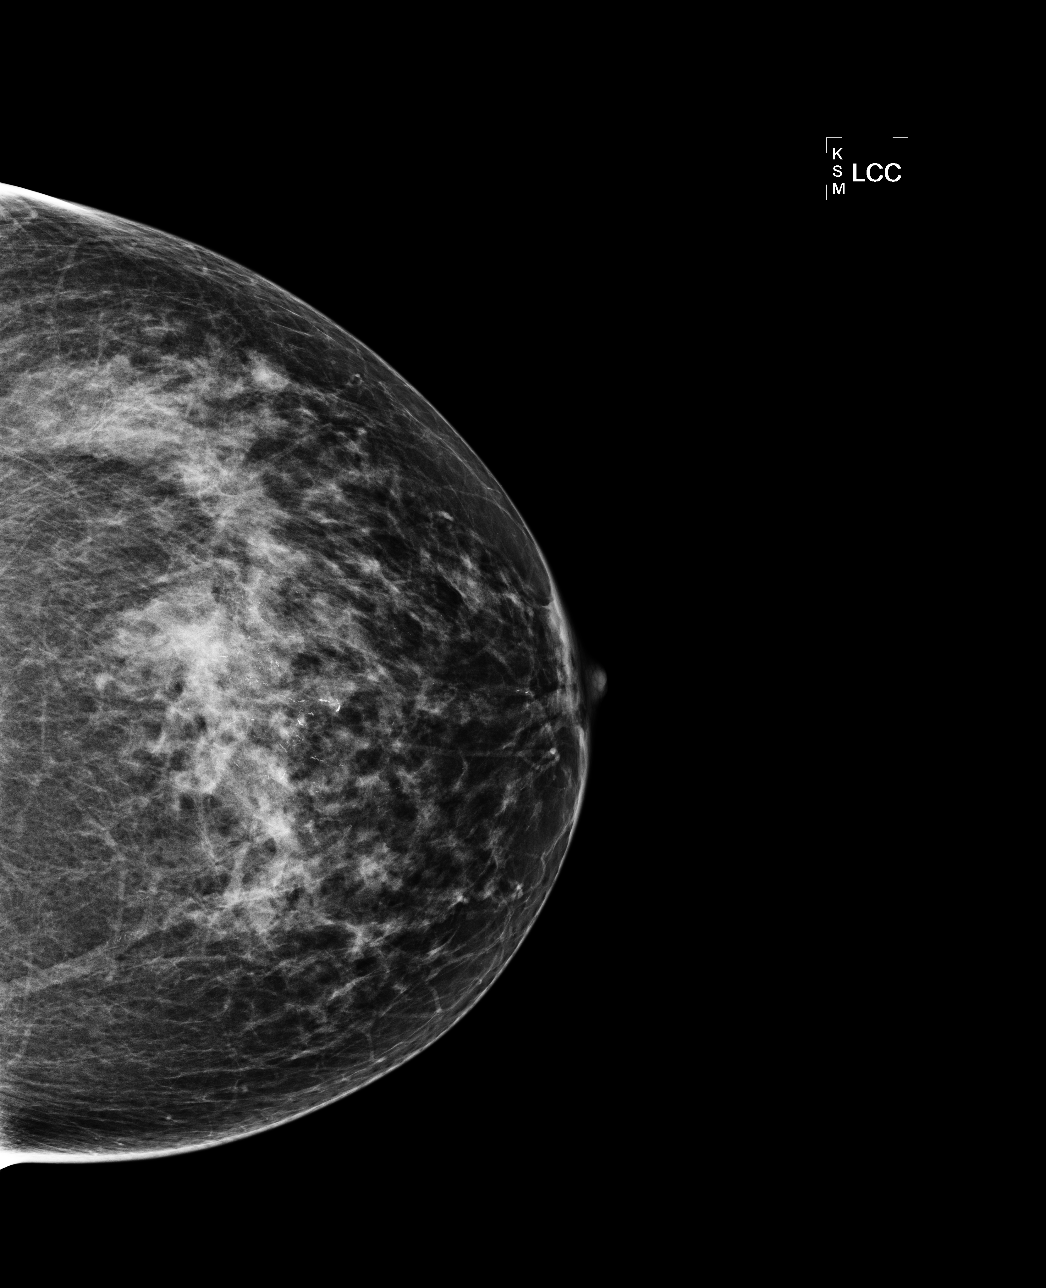

[L MLO]
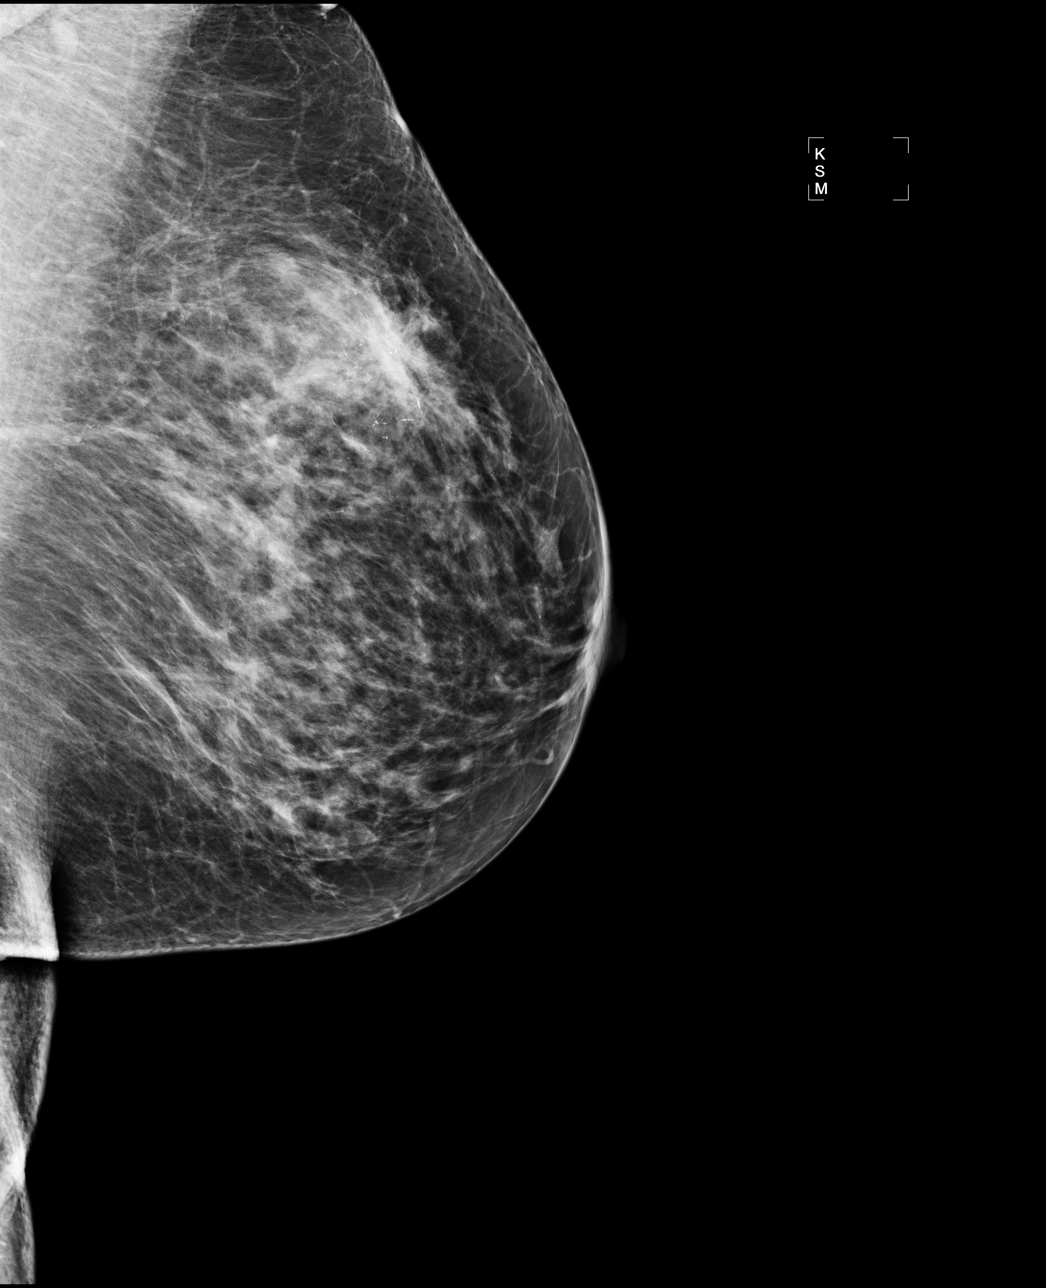

[R MLO]
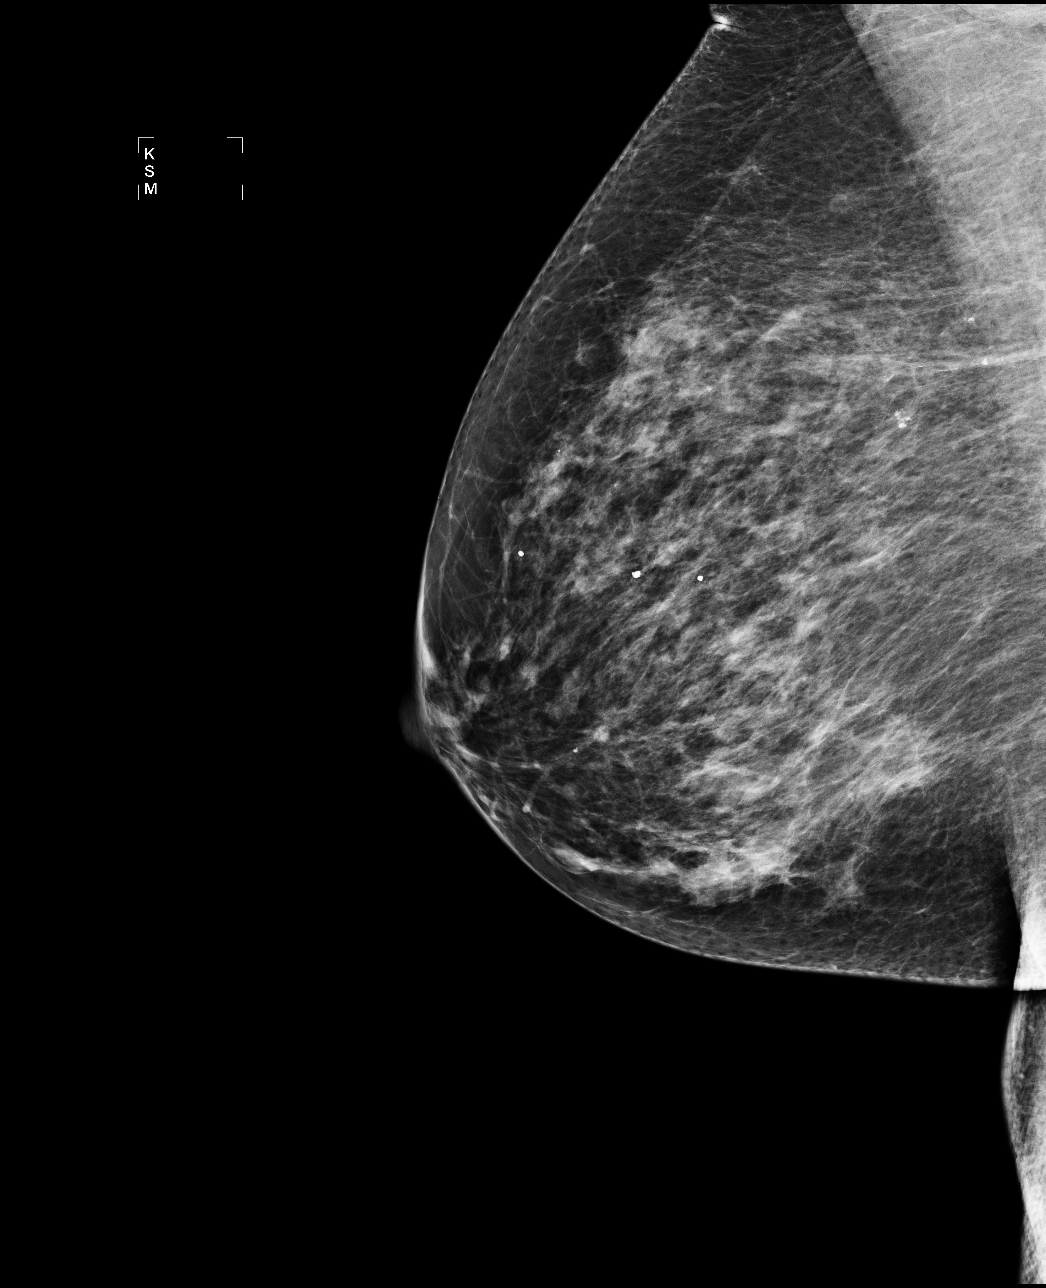

[4 of 4 positions shown; findings below may reference images not displayed]

IMPRESSION: Calcifications, left breast.  Additional evaluation is indicated. The patient will be contacted for
additional studies and a supplementary report will follow.  No specific mammographic evidence of 
malignancy, right breast.

ASSESSMENT: Need additional imaging evaluation and/or prior mammograms for comparison - BI-RADS 0

Further imaging of the left breast.
ANALYZED BY COMPUTER AIDED DETECTION. , THIS PROCEDURE WAS A DIGITAL MAMMOGRAM.

## 2010-07-02 NOTE — Op Note (Signed)
NAMESUMIKO, CEASAR                  ACCOUNT NO.:  [id number]   MEDICAL RECORD NO.:  [id number]          PATIENT TYPE:  INP   LOCATION:  2899                         FACILITY:  [facility]   PHYSICIAN:  Hewitt Shorts, M.D.DATE OF BIRTH:  06-16-1930   DATE OF PROCEDURE:  05/17/2004  DATE OF DISCHARGE:                                 OPERATIVE REPORT   PREOPERATIVE DIAGNOSIS:  Right L5-S1 extraforaminal lumbar disk herniation,  lumbar degenerative disk disease, lumbar spondylosis, and lumbar  radiculopathy.   POSTOPERATIVE DIAGNOSIS:  Right L5-S1 extraforaminal lumbar disk herniation,  lumbar degenerative disk disease, lumbar spondylosis, and lumbar  radiculopathy.   PROCEDURE:  Right L5-S1 extraforaminal microdiskectomy with microdissection.   SURGEON:  Hewitt Shorts, M.D.   ASSISTANT:  Danae Orleans. Venetia Maxon, M.D.   ANESTHESIA:  General endotracheal.   INDICATIONS FOR PROCEDURE:  The patient is a 77 year old woman who presented  with a right lumbar radiculopathy.  He was found by MRI scan to have a right  L5-S1 extraforaminal disk herniation.  The decision was made to proceed with  microdiskectomy.   DESCRIPTION OF PROCEDURE:  The patient was brought to the operating room and  placed under general endotracheal anesthesia.  The patient was turned to a  prone position.  Lumbar region was prepped with Betadine soap and solution  and draped in a sterile fashion.  The midline was infiltrated with local  anesthetic with epinephrine.  An x-ray was taken and the L5-S1 level was  identified.  Midline incision was made over the L5-S1 level and carried down  to the subcutaneous tissue.  Bipolar cautery and electrocautery was used to  maintain hemostasis.  Dissection was carried down to the lumbar fascia which  was incised on the right side of the midline and the paraspinal muscles were  dissected from the spinous process of the lamina in a subperiosteal fashion.  The L5-S1 intralaminar  space was identified.  X-rays were taken to confirm  the localization and dissection was carried out laterally over the L5-S1  facet complexes.  We identified the transverse process of L5 as well as the  ala of S1 and then the microscope was draped and brought into the field to  provide additional magnification, illumination, and visualization.  The  remainder of the decompression was performed using microdissection and  microsurgical technique.  The lateral facetectomy was performed.  We also  removed the inferior aspect of the L5 transverse process and the superior  aspect of the ala of the sacrum and were able to explore the right L5-S1  extraforaminal space.  The two structures that appeared to be nerves, one of  them appeared swollen and we were able to dissect further inferomedially  until we encountered fat within the neuroforamen.  The inferior of the two  nerves superolaterally exposing the free fragment disk herniation.  Disk was  removed in piecemeal fashion with micropituitary rongeur and numerous small  fragments of disk herniation that were free in the epidural space  compressing the nerve root were removed and good decompression of the nerve  was  achieved.  We then further examined the ventral aspect of the  extraforaminal space.  Further fragments were removed and in the end all  loose fragments of disk material from the extraforaminal space were removed  and good decompression of the neural structures was achieved.  Hemostasis  was established with the use of bipolar cautery as well as Gelfoam soaked in  thrombin, however, all of the Gelfoam was removed prior to closure.  Once  the decompression was completed and hemostasis established, we instilled 2  mL of fentanyl and 80 mg of Depo-Medrol into the extraforaminal space and  proceeded with closure.  The defects were closed with interrupted undyed 1  Vicryl suture.  The subcutaneous and subcuticular were closed with   interrupted and inverted 2-0 undyed Vicryl sutures and the skin edges were  approximated with Dermabond.  The procedure was tolerated well.  The  estimated blood loss was 50 mL.  Needle, sponge, and instrument counts  correct.  Following surgery, the patient was turned back to the supine  position to be reversed from anesthesia, extubated, and transferred to the  recovery room for further care.      RWN/MEDQ  D:  05/17/2004  T:  05/17/2004  Job:  [id number]

## 2010-08-27 ENCOUNTER — Other Ambulatory Visit (HOSPITAL_COMMUNITY): Payer: Self-pay | Admitting: Oncology

## 2010-08-27 ENCOUNTER — Encounter (HOSPITAL_BASED_OUTPATIENT_CLINIC_OR_DEPARTMENT_OTHER): Payer: Medicare Other | Admitting: Oncology

## 2010-08-27 DIAGNOSIS — D0590 Unspecified type of carcinoma in situ of unspecified breast: Secondary | ICD-10-CM

## 2010-08-27 DIAGNOSIS — C50419 Malignant neoplasm of upper-outer quadrant of unspecified female breast: Secondary | ICD-10-CM

## 2010-08-27 LAB — CBC WITH DIFFERENTIAL/PLATELET
BASO%: 0.7 % (ref 0.0–2.0)
Basophils Absolute: 0 10*3/uL (ref 0.0–0.1)
EOS%: 2.4 % (ref 0.0–7.0)
Eosinophils Absolute: 0.1 10*3/uL (ref 0.0–0.5)
HCT: 38 % (ref 34.8–46.6)
HGB: 13 g/dL (ref 11.6–15.9)
LYMPH%: 35.9 % (ref 14.0–49.7)
MCH: 31.1 pg (ref 25.1–34.0)
MCHC: 34.2 g/dL (ref 31.5–36.0)
MCV: 90.9 fL (ref 79.5–101.0)
MONO#: 0.6 10*3/uL (ref 0.1–0.9)
MONO%: 15.8 % — ABNORMAL HIGH (ref 0.0–14.0)
NEUT#: 1.6 10*3/uL (ref 1.5–6.5)
NEUT%: 45.2 % (ref 38.4–76.8)
Platelets: 217 10*3/uL (ref 145–400)
RBC: 4.18 10*6/uL (ref 3.70–5.45)
RDW: 14 % (ref 11.2–14.5)
WBC: 3.6 10*3/uL — ABNORMAL LOW (ref 3.9–10.3)
lymph#: 1.3 10*3/uL (ref 0.9–3.3)

## 2010-08-27 LAB — COMPREHENSIVE METABOLIC PANEL
ALT: 14 U/L (ref 0–35)
AST: 19 U/L (ref 0–37)
Albumin: 4.4 g/dL (ref 3.5–5.2)
Alkaline Phosphatase: 60 U/L (ref 39–117)
BUN: 13 mg/dL (ref 6–23)
CO2: 33 mEq/L — ABNORMAL HIGH (ref 19–32)
Calcium: 9.8 mg/dL (ref 8.4–10.5)
Chloride: 100 mEq/L (ref 96–112)
Creatinine, Ser: 0.83 mg/dL (ref 0.50–1.10)
Glucose, Bld: 92 mg/dL (ref 70–99)
Potassium: 3.5 mEq/L (ref 3.5–5.3)
Sodium: 141 mEq/L (ref 135–145)
Total Bilirubin: 0.6 mg/dL (ref 0.3–1.2)
Total Protein: 7.1 g/dL (ref 6.0–8.3)

## 2010-08-27 LAB — LACTATE DEHYDROGENASE: LDH: 189 U/L (ref 94–250)

## 2010-10-27 ENCOUNTER — Other Ambulatory Visit: Payer: Self-pay | Admitting: Family Medicine

## 2010-10-27 DIAGNOSIS — C50919 Malignant neoplasm of unspecified site of unspecified female breast: Secondary | ICD-10-CM

## 2010-11-11 LAB — CBC
HCT: 39
Hemoglobin: 13.8
MCHC: 35.2
MCV: 92.5
Platelets: 246
RBC: 4.22
RDW: 14.2
WBC: 5.3

## 2010-11-11 LAB — BASIC METABOLIC PANEL
BUN: 8
CO2: 30
Calcium: 10
Chloride: 103
Creatinine, Ser: 0.76
GFR calc Af Amer: >60
GFR calc non Af Amer: >60
Glucose, Bld: 104 — ABNORMAL HIGH
Potassium: 3.7
Sodium: 140

## 2010-11-11 LAB — TYPE AND SCREEN
ABO/RH(D): O POS
Antibody Screen: NEGATIVE

## 2010-11-11 LAB — ABO/RH: ABO/RH(D): O POS

## 2010-11-16 LAB — BASIC METABOLIC PANEL
BUN: 11
CO2: 31
Calcium: 9.9
Chloride: 101
Creatinine, Ser: 0.79
GFR calc Af Amer: >60
GFR calc non Af Amer: >60
Glucose, Bld: 99
Potassium: 3.8
Sodium: 139

## 2010-11-16 LAB — COMPREHENSIVE METABOLIC PANEL
ALT: 14
AST: 20
Albumin: 4.3
Alkaline Phosphatase: 64
BUN: 10
CO2: 30
Calcium: 9.9
Chloride: 103
Creatinine, Ser: 0.68
GFR calc Af Amer: >60
GFR calc non Af Amer: >60
Glucose, Bld: 97
Potassium: 3.4 — ABNORMAL LOW
Sodium: 141
Total Bilirubin: 0.6
Total Protein: 6.8

## 2010-11-16 LAB — CBC
HCT: 36.4
Hemoglobin: 12.5
MCHC: 34.2
MCV: 88.9
Platelets: 276
RBC: 4.1
RDW: 14.5
WBC: 5.2

## 2010-11-16 LAB — DIFFERENTIAL
Basophils Absolute: 0
Basophils Relative: 1
Eosinophils Absolute: 0.1
Eosinophils Relative: 2
Lymphocytes Relative: 42
Lymphs Abs: 2.2
Monocytes Absolute: 0.5
Monocytes Relative: 9
Neutro Abs: 2.4
Neutrophils Relative %: 46

## 2010-11-16 LAB — POCT HEMOGLOBIN-HEMACUE: Hemoglobin: 11.9 — ABNORMAL LOW

## 2010-11-23 LAB — DIFFERENTIAL
Basophils Absolute: 0.1
Basophils Relative: 1
Eosinophils Absolute: 0.1
Eosinophils Relative: 2
Lymphocytes Relative: 37
Lymphs Abs: 2.2
Monocytes Absolute: 0.7
Monocytes Relative: 11
Neutro Abs: 2.9
Neutrophils Relative %: 49

## 2010-11-23 LAB — CBC
HCT: 39.9
Hemoglobin: 13.4
MCHC: 33.8
MCV: 90.7
Platelets: 286
RBC: 4.4
RDW: 12.9
WBC: 5.9

## 2010-11-23 LAB — COMPREHENSIVE METABOLIC PANEL
ALT: 19
AST: 23
Albumin: 4.2
Alkaline Phosphatase: 72
BUN: 13
CO2: 32
Calcium: 9.8
Chloride: 102
Creatinine, Ser: 0.91
GFR calc Af Amer: >60
GFR calc non Af Amer: >60
Glucose, Bld: 101 — ABNORMAL HIGH
Potassium: 3.6
Sodium: 139
Total Bilirubin: 0.8
Total Protein: 7

## 2010-11-29 ENCOUNTER — Ambulatory Visit
Admission: RE | Admit: 2010-11-29 | Discharge: 2010-11-29 | Disposition: A | Discharge status: Home / Self Care | Payer: Medicare Other | Source: Ambulatory Visit | Attending: Family Medicine | Admitting: Family Medicine

## 2010-11-29 DIAGNOSIS — C50919 Malignant neoplasm of unspecified site of unspecified female breast: Secondary | ICD-10-CM

## 2011-01-08 ENCOUNTER — Telehealth: Payer: Self-pay | Admitting: Oncology

## 2011-01-08 NOTE — Telephone Encounter (Signed)
S/w the pt regarding her jan 2013 appts °

## 2011-02-15 HISTORY — PX: OTHER SURGICAL HISTORY: SHX169

## 2011-02-18 ENCOUNTER — Telehealth: Payer: Self-pay | Admitting: Oncology

## 2011-02-18 NOTE — Telephone Encounter (Signed)
pt is aware of appt being moved from 1/15 to 1/31 with sara at 9:00   aom

## 2011-03-01 ENCOUNTER — Other Ambulatory Visit: Payer: Medicare Other | Admitting: Lab

## 2011-03-01 ENCOUNTER — Ambulatory Visit: Payer: Medicare Other | Admitting: Hematology and Oncology

## 2011-03-01 ENCOUNTER — Ambulatory Visit: Payer: Medicare Other | Admitting: Oncology

## 2011-03-17 ENCOUNTER — Other Ambulatory Visit (HOSPITAL_BASED_OUTPATIENT_CLINIC_OR_DEPARTMENT_OTHER): Payer: Medicare Other | Admitting: Lab

## 2011-03-17 ENCOUNTER — Telehealth: Payer: Self-pay | Admitting: Oncology

## 2011-03-17 ENCOUNTER — Encounter: Payer: Self-pay | Admitting: Physician Assistant

## 2011-03-17 ENCOUNTER — Ambulatory Visit (HOSPITAL_BASED_OUTPATIENT_CLINIC_OR_DEPARTMENT_OTHER): Payer: Medicare Other | Admitting: Physician Assistant

## 2011-03-17 VITALS — BP 175/95 | HR 72 | Temp 97.1°F | Wt 170.5 lb

## 2011-03-17 DIAGNOSIS — D0590 Unspecified type of carcinoma in situ of unspecified breast: Secondary | ICD-10-CM

## 2011-03-17 DIAGNOSIS — C50919 Malignant neoplasm of unspecified site of unspecified female breast: Secondary | ICD-10-CM

## 2011-03-17 DIAGNOSIS — D0510 Intraductal carcinoma in situ of unspecified breast: Secondary | ICD-10-CM

## 2011-03-17 LAB — CBC WITH DIFFERENTIAL/PLATELET
BASO%: 0.9 % (ref 0.0–2.0)
Basophils Absolute: 0 10*3/uL (ref 0.0–0.1)
EOS%: 3.1 % (ref 0.0–7.0)
Eosinophils Absolute: 0.1 10*3/uL (ref 0.0–0.5)
HCT: 39.1 % (ref 34.8–46.6)
HGB: 13.5 g/dL (ref 11.6–15.9)
LYMPH%: 32.6 % (ref 14.0–49.7)
MCH: 30.8 pg (ref 25.1–34.0)
MCHC: 34.4 g/dL (ref 31.5–36.0)
MCV: 89.6 fL (ref 79.5–101.0)
MONO#: 0.5 10*3/uL (ref 0.1–0.9)
MONO%: 13.4 % (ref 0.0–14.0)
NEUT#: 1.7 10*3/uL (ref 1.5–6.5)
NEUT%: 50 % (ref 38.4–76.8)
Platelets: 238 10*3/uL (ref 145–400)
RBC: 4.37 10*6/uL (ref 3.70–5.45)
RDW: 14.3 % (ref 11.2–14.5)
WBC: 3.5 10*3/uL — ABNORMAL LOW (ref 3.9–10.3)
lymph#: 1.1 10*3/uL (ref 0.9–3.3)

## 2011-03-17 LAB — COMPREHENSIVE METABOLIC PANEL
ALT: 15 U/L (ref 0–35)
AST: 21 U/L (ref 0–37)
Albumin: 4.4 g/dL (ref 3.5–5.2)
Alkaline Phosphatase: 67 U/L (ref 39–117)
BUN: 9 mg/dL (ref 6–23)
CO2: 33 mEq/L — ABNORMAL HIGH (ref 19–32)
Calcium: 9.6 mg/dL (ref 8.4–10.5)
Chloride: 103 mEq/L (ref 96–112)
Creatinine, Ser: 0.78 mg/dL (ref 0.50–1.10)
Glucose, Bld: 83 mg/dL (ref 70–99)
Potassium: 3.6 mEq/L (ref 3.5–5.3)
Sodium: 142 mEq/L (ref 135–145)
Total Bilirubin: 0.5 mg/dL (ref 0.3–1.2)
Total Protein: 7.1 g/dL (ref 6.0–8.3)

## 2011-03-17 LAB — LACTATE DEHYDROGENASE: LDH: 167 U/L (ref 94–250)

## 2011-03-17 NOTE — Telephone Encounter (Signed)
appt made and printed for 7/28  aom

## 2011-03-17 NOTE — Progress Notes (Signed)
[facility] OFFICE PROGRESS NOTE  No primary provider on file.  CC: Lupita Raider, M.D. [name], M.D. Lennie Muckle, MD   HISTORY:  Judy [name] was seen today for followup of her recurrent high-grade DCIS involving the left breast.  It will be recalled that the patient originally had DCIS involving the upper outer quadrant of the left breast with focally high-grade histology back in December 2008.  The patient was lost to followup.  She did not have any radiation treatments at that time, although that was certainly our plan.  The patient was found to have recurrent disease in October 2009.  She underwent several excisions and completed her radiation treatments to the breast from March 03, 2008, through March 27, 2008.  There was no evidence for invasive tumor.  Hormone receptors were negative.  Margins were clear after several excisions.  Estimated tumor size was felt to be about 1.6 cm.  Judy [name] was last seen by [name] on 08/27/2010.  The patient underwent bilateral mammograms on 11/29/2010, essentially benign.   The last report that we have on the chart is from 11/27/2009 and that was negative.  The patient feels entirely well.  She is without complaints, is quite active, drives, independent, lives with her husband.  The patient states that she did scratch her left nipple and had a little scab on that that has come off.  She has had no bleeding or discharge.  She complains of some itching of the left breast resolved with moisturizing lotion.. Her latest LDH was 189 in August 27, 2010. New values pending.   MEDICATIONS: No current outpatient prescriptions on file.    ALLERGIES:   has no known allergies.  REVIEW OF SYSTEMS:  The rest of the 14-point review of system was negative.     PHYSICAL EXAMINATION:   General:  The patient looks well.  Vital Signs:  Her weight is fairly stable at 175pounds, although there are some fluctuations.  Height 5 feet 4 inches, blood  pressure 95/72.  Other vital signs are normal.  HEENT:  There is no scleral icterus.  Mouth and pharynx are benign.  Lymph:  No peripheral adenopathy palpable.  Heart/Lungs:  Normal.  Breasts:  Cosmetic result regarding her left breast is excellent.  The right breast may be just a little bit fuller than the left breast.  No suspicious findings.  There is a slightly denuded area over the left nipple, but this looks benign to me.  No dimpling, nipple inversion, or suspicious findings.  No mass.  No axillary adenopathy.  Abdomen:  Benign.  No organomegaly or mass is palpable.  Extremities:  No obvious lymphedema of the left arm.  Extremities without edema.    LABORATORY/RADIOLOGY DATA:   Lab 03/17/11 0845  WBC 3.5*  HGB 13.5  HCT 39.1  PLT 238  MCV 89.6  MCH 30.8  MCHC 34.4  RDW 14.3  LYMPHSABS 1.1  MONOABS 0.5  EOSABS 0.1  BASOSABS 0.0  BANDABS --    CMP   No results found for this basename: NA:5,K:5,CL:5,CO2:5,GLUCOSE:5,BUN:5,CREATININE:5,GFRCGP,:5,CALCIUM:5,MG:5,AST:5,ALT:5,ALKPHOS:5,BILITOT:5 in the last 168 hours      Component Value Date/Time   BILITOT 0.6 08/27/2010 0850     Radiology Studies:  *RADIOLOGY REPORT*  Clinical Data: Left breast cancer status post lumpectomy in 2009  DIGITAL DIAGNOSTIC BILATERAL MAMMOGRAM WITH CAD  Comparison: With priors  Findings: There are scattered fibroglandular densities. There are stable lumpectomy changes in the left breast. There is no suspicious  mass or malignant-type microcalcifications.Mammographic images were processed with CAD.  IMPRESSION: No evidence of malignancy in either breast. Diagnostic mammogram in 1 year is recommended.  BI-RADS CATEGORY 2: Benign finding(s).  Original Report Authenticated By: Littie Deeds. ARCEO, M.D.        ASSESSMENT AND PLAN:     The patient is now 4 years from the time of original diagnosis and is  3 years from the time of recurrence.  She is not on any adjuvant hormonal therapy at this  time, given her negative hormone receptors. She will be returning for a follow up visit in July 2013. She will check CBC and chemistries.  She will be due for repeat mammograms in October.  We will try to obtain the latest mammogram report on Judy [name] from October 2011.  The patient states her overall health is good.    She was asking how much longer she needs to come here.  I think that it still would be wise for [name] to see her every 6 months.

## 2011-04-15 DEATH — deceased

## 2011-07-05 ENCOUNTER — Encounter: Payer: Self-pay | Admitting: Gastroenterology

## 2011-08-01 ENCOUNTER — Encounter: Payer: Self-pay | Admitting: Gastroenterology

## 2011-09-06 ENCOUNTER — Other Ambulatory Visit: Payer: Self-pay | Admitting: Family Medicine

## 2011-09-06 ENCOUNTER — Ambulatory Visit
Admission: RE | Admit: 2011-09-06 | Discharge: 2011-09-06 | Disposition: A | Discharge status: Home / Self Care | Payer: Medicare Other | Source: Ambulatory Visit | Attending: Family Medicine | Admitting: Family Medicine

## 2011-09-06 DIAGNOSIS — M549 Dorsalgia, unspecified: Secondary | ICD-10-CM

## 2011-09-06 DIAGNOSIS — R600 Localized edema: Secondary | ICD-10-CM

## 2011-09-07 ENCOUNTER — Ambulatory Visit (AMBULATORY_SURGERY_CENTER): Payer: Medicare Other | Admitting: *Deleted

## 2011-09-07 ENCOUNTER — Encounter: Payer: Self-pay | Admitting: Gastroenterology

## 2011-09-07 VITALS — Ht 63.0 in | Wt 174.5 lb

## 2011-09-07 DIAGNOSIS — Z1211 Encounter for screening for malignant neoplasm of colon: Secondary | ICD-10-CM

## 2011-09-07 MED ORDER — MOVIPREP 100 G PO SOLR: ORAL | Status: DC

## 2011-09-12 ENCOUNTER — Other Ambulatory Visit: Payer: Self-pay | Admitting: Medical Oncology

## 2011-09-12 ENCOUNTER — Telehealth: Payer: Self-pay | Admitting: Oncology

## 2011-09-12 ENCOUNTER — Ambulatory Visit
Admission: RE | Admit: 2011-09-12 | Discharge: 2011-09-12 | Disposition: A | Discharge status: Home / Self Care | Payer: Medicare Other | Source: Ambulatory Visit | Attending: Family | Admitting: Family

## 2011-09-12 ENCOUNTER — Encounter: Payer: Self-pay | Admitting: Family

## 2011-09-12 ENCOUNTER — Other Ambulatory Visit (HOSPITAL_BASED_OUTPATIENT_CLINIC_OR_DEPARTMENT_OTHER): Payer: Medicare Other | Admitting: Lab

## 2011-09-12 ENCOUNTER — Ambulatory Visit (HOSPITAL_BASED_OUTPATIENT_CLINIC_OR_DEPARTMENT_OTHER): Payer: Medicare Other | Admitting: Family

## 2011-09-12 ENCOUNTER — Telehealth (INDEPENDENT_AMBULATORY_CARE_PROVIDER_SITE_OTHER): Payer: Self-pay

## 2011-09-12 VITALS — BP 147/84 | HR 51 | Temp 96.8°F | Ht 63.0 in | Wt 173.3 lb

## 2011-09-12 DIAGNOSIS — D0590 Unspecified type of carcinoma in situ of unspecified breast: Secondary | ICD-10-CM

## 2011-09-12 DIAGNOSIS — D0510 Intraductal carcinoma in situ of unspecified breast: Secondary | ICD-10-CM

## 2011-09-12 LAB — LACTATE DEHYDROGENASE: LDH: 190 U/L (ref 94–250)

## 2011-09-12 LAB — COMPREHENSIVE METABOLIC PANEL
ALT: 13 U/L (ref 0–35)
AST: 20 U/L (ref 0–37)
Albumin: 4.2 g/dL (ref 3.5–5.2)
Alkaline Phosphatase: 61 U/L (ref 39–117)
BUN: 12 mg/dL (ref 6–23)
CO2: 30 mEq/L (ref 19–32)
Calcium: 9.5 mg/dL (ref 8.4–10.5)
Chloride: 104 mEq/L (ref 96–112)
Creatinine, Ser: 0.77 mg/dL (ref 0.50–1.10)
Glucose, Bld: 97 mg/dL (ref 70–99)
Potassium: 3.3 mEq/L — ABNORMAL LOW (ref 3.5–5.3)
Sodium: 142 mEq/L (ref 135–145)
Total Bilirubin: 0.5 mg/dL (ref 0.3–1.2)
Total Protein: 6.7 g/dL (ref 6.0–8.3)

## 2011-09-12 LAB — CBC WITH DIFFERENTIAL/PLATELET
BASO%: 1.3 % (ref 0.0–2.0)
Basophils Absolute: 0 10*3/uL (ref 0.0–0.1)
EOS%: 2.3 % (ref 0.0–7.0)
Eosinophils Absolute: 0.1 10*3/uL (ref 0.0–0.5)
HCT: 38.6 % (ref 34.8–46.6)
HGB: 13 g/dL (ref 11.6–15.9)
LYMPH%: 30.9 % (ref 14.0–49.7)
MCH: 30.5 pg (ref 25.1–34.0)
MCHC: 33.8 g/dL (ref 31.5–36.0)
MCV: 90.3 fL (ref 79.5–101.0)
MONO#: 0.4 10*3/uL (ref 0.1–0.9)
MONO%: 12.3 % (ref 0.0–14.0)
NEUT#: 1.9 10*3/uL (ref 1.5–6.5)
NEUT%: 53.2 % (ref 38.4–76.8)
Platelets: 217 10*3/uL (ref 145–400)
RBC: 4.27 10*6/uL (ref 3.70–5.45)
RDW: 14.1 % (ref 11.2–14.5)
WBC: 3.6 10*3/uL — ABNORMAL LOW (ref 3.9–10.3)
lymph#: 1.1 10*3/uL (ref 0.9–3.3)

## 2011-09-12 NOTE — Patient Instructions (Signed)
Patient ID: MOE BRIER,   DOB: 04-29-1930,  MRN: [id number]   [facility] Discharge Instructions  RECOMMENDATIONS MAD BY THE CONSULTANT AND ANY TEST RESULT(S) WILL BE FORWARDED TO YOU REFERRING DOCTOR   EXAM FINDINGS BY NURSE PRACTITIONER TODAY TO REPORT TO THE CLINIC OR PRIMARY PROVIDER: Recurrent left breast lesion - Mrs. Mofield to receive mammogram and be referred to the [facility] Please follow up with your PCP regarding your elevated blood pressure (147/84 today).  Your Current Medications Are: Current Outpatient Prescriptions  Medication Sig Dispense Refill  . alendronate (FOSAMAX) 70 MG tablet Take 70 mg by mouth every 7 (seven) days. Take with a full glass of water on an empty stomach.      Marland Kitchen amLODipine-benazepril (LOTREL) 5-40 MG per capsule Take 1 capsule by mouth daily.      Marland Kitchen MOVIPREP 100 G SOLR movi prep as directed no substitue  1 each  0  . Multiple Vitamin (MULTIVITAMIN) capsule Take 1 capsule by mouth daily.      . nabumetone (RELAFEN) 500 MG tablet Take 500 mg by mouth 2 (two) times daily as needed.      . simvastatin (ZOCOR) 20 MG tablet Take 20 mg by mouth every evening.      Marland Kitchen amLODipine (NORVASC) 10 MG tablet Take 10 mg by mouth daily.      Marland Kitchen lisinopril (PRINIVIL,ZESTRIL) 40 MG tablet Take 40 mg by mouth daily.      . simvastatin (ZOCOR) 40 MG tablet Take 40 mg by mouth every evening.         INSTRUCTIONS GIVEN, DISCUSSED AND FOLLOW-UP: (see above)  I acknowledge that I have been informed and understand all the instructions given to me and have received a copy.  I do not have any further questions at this time, but I understand that I may call the [facility] at [phone] during business hours should I have any further questions or need assistance in obtaining follow-up care.   09/12/2011, 10:10 AM

## 2011-09-12 NOTE — Telephone Encounter (Signed)
gv pt appt schedule for September and appt for Mammo today @ 2:30 pm @ [facility]. Pt aware she is on schedule w/Dr. Jamey Ripa for 9/6 but office is working on getting her in sooner. S/w Bernie in nursing and per Cyndra Numbers Dr. Jamey Ripa is in surgery and his nurse Lesly Rubenstein is out. Per Cyndra Numbers she is sending a message to [facility] who will call me back re a sooner appt 1st thing in the morning. Pt aware.

## 2011-09-12 NOTE — Progress Notes (Signed)
Patient ID: Judy [name], female   DOB: 1930-02-19, 78 y.o.   MRN: [id number] CSN: [id number]  History: Judy [name] is a 78 y.o. African-American female.  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 The patient originally had DCIS involving the upper outer quadrant of the left breast with focally high-grade histology in December 2008.  the patient was lost to followup. She did not have any radiation treatments at that time,  although that was certainly the plan.  The patient was found to have recurrent disease in October 2009. She underwent several excisions and completed radiation treatments to the breast from January 2010 February 2010. There was no evidence for invasive tumor. Hormone receptors were negative. Margins were clear after several excisions. Estimated tumor size was felt to be about 1.6 cm. The patient underwent bilateral mammograms on 11/29/2010 that was essentially benign. Mrs. Judy [name] was last seen by Judy [name] on January 31,2013. The patient is scheduled for her annual mammogram in October 2013 however during examination today the patient had a suspicious left breast lesion that has recurrent scabbing and was scheduled for the left unilateral mammogram today.   The patient will also be referred to Dr. Cicero Duck for follow-up regarding the scabbing on the nipple of her left breast.  In general, the patient states that she feels well.  The patient resides with her husband and still operates an automobile. The patient stated that her primary care physician discontinued her medication of Lisinopril one week ago as it caused her bilateral lower extremity swelling.  Her leg swelling has since resolved after discontinuing this medication.  The patient's blood pressure today was slightly elevated at 147/84 and the patient was asked to follow up with her primary care physician regarding her blood pressure.   Problem List: Past Medical History  Diagnosis  Date  . Cancer of breast, intraductal   . GERD (gastroesophageal reflux disease)   . HTN (hypertension)   . Dyslipidemia    Past Surgical History: Past Surgical History  Procedure Date  . Rotator cuff repair     Bilateral, 2010  . Back surgery   . Total abdominal hysterectomy    Medications: Current Outpatient Prescriptions  Medication Sig Dispense Refill  . alendronate (FOSAMAX) 70 MG tablet Take 70 mg by mouth every 7 (seven) days. Take with a full glass of water on an empty stomach.      Marland Kitchen amLODipine-benazepril (LOTREL) 5-40 MG per capsule Take 1 capsule by mouth daily.      Marland Kitchen MOVIPREP 100 G SOLR movi prep as directed no substitue  1 each  0  . Multiple Vitamin (MULTIVITAMIN) capsule Take 1 capsule by mouth daily.      . nabumetone (RELAFEN) 500 MG tablet Take 500 mg by mouth 2 (two) times daily as needed.      . simvastatin (ZOCOR) 20 MG tablet Take 20 mg by mouth every evening.      Marland Kitchen DISCONTD: simvastatin (ZOCOR) 40 MG tablet Take 40 mg by mouth every evening.       Allergies: Allergies  Allergen Reactions  . Amoxicillin Nausea Only  . Codeine Nausea Only     Family Medical History: Family History  Problem Relation Age of Onset  . Cancer Sister     breast  . Breast cancer Sister   . Cancer Sister     lung  . Diabetes Son   . Colon cancer Brother  Social History: History  Substance Use Topics  . Smoking status: Never Smoker   . Smokeless tobacco: Not on file  . Alcohol Use: No    Review of Systems: Review of Systems - Pertinent findings stated in the history    Physical Exam: Blood pressure 147/84, pulse 51, temperature 96.8 F (36 C), temperature source Oral, height 5\' 3"  (1.6 m), weight 173 lb 4.8 oz (78.608 kg). General appearance: Alert, cooperative,  well nourished, no apparent distress Eyes: Conjunctivae clear, arcus senilis,  PERRLA, EOMI Throat: Lips, mucosa, tongue, teeth and gums are normal, no adenopathy Lungs: CTA, no  wheezes/rales/rhonchi Breasts: No  tenderness,  Left nipple (crusty) lesion, no axillary adenopathy, no UE lymphedema Heart: Regular rate and rhythm, S1, S2 normal, no murmur/click/rub/gallop Abdomen: Soft, slightly distended, no organomegaly, no tenderness Extremities: Extremities normal, atraumatic, no cyanosis or edema Neurologic: Grossly normal    Laboratory Data: Results for orders placed in visit on 09/12/11 (from the past 48 hour(s))  CBC WITH DIFFERENTIAL     Status: Abnormal   Collection Time   09/12/11  8:33 AM      Component Value Range Comment   WBC 3.6 (*) 3.9 - 10.3 10e3/uL    NEUT# 1.9  1.5 - 6.5 10e3/uL    HGB 13.0  11.6 - 15.9 g/dL    HCT 04.5  40.9 - 81.1 %    Platelets 217  145 - 400 10e3/uL    MCV 90.3  79.5 - 101.0 fL    MCH 30.5  25.1 - 34.0 pg    MCHC 33.8  31.5 - 36.0 g/dL    RBC 9.14  7.82 - 9.56 10e6/uL    RDW 14.1  11.2 - 14.5 %    lymph# 1.1  0.9 - 3.3 10e3/uL    MONO# 0.4  0.1 - 0.9 10e3/uL    Eosinophils Absolute 0.1  0.0 - 0.5 10e3/uL    Basophils Absolute 0.0  0.0 - 0.1 10e3/uL    NEUT% 53.2  38.4 - 76.8 %    LYMPH% 30.9  14.0 - 49.7 %    MONO% 12.3  0.0 - 14.0 %    EOS% 2.3  0.0 - 7.0 %    BASO% 1.3  0.0 - 2.0 %    Imaging Studies: Dg Chest 2 View 09/06/2011  *RADIOLOGY REPORT*  Clinical Data: Leg edema, hypertension  CHEST - 2 VIEW  Comparison: 06/19/2009  Findings: Stable cardiomegaly with central vascular prominence and ectatic changes of the aorta.  Postop changes of the left chest wall.  Negative for CHF or pneumonia.  No effusion or pneumothorax. Trachea is midline.  Degenerative changes of the spine with an associated scoliosis.  IMPRESSION: Stable chronic and postoperative changes.  No superimposed acute process.  Original Report Authenticated By: Judie Petit. Ruel Favors, M.D.   Dg Lumbar Spine Complete 09/06/2011  *RADIOLOGY REPORT*  Clinical Data: Back pain, history of lumbar fusion  LUMBAR SPINE - COMPLETE 4+ VIEW  Comparison: 05/26/2010   Findings: Advanced degenerative changes and spondylosis of the lumbar spine and associated dextroscoliosis.  L5 S1 posterior fusion hardware noted with an L5 S1 disc spacer.  Stable hardware and alignment.  No new compression fracture, wedge shaped deformity or focal kyphosis.  Symmetric normal SI joints.  IMPRESSION: Stable L5 S1 posterior fusion hardware.  Advanced lumbar degenerative changes and spondylosis and associated dextroscoliosis.  No acute compression fracture evident.  Original Report Authenticated By: Judie Petit. Ruel Favors, M.D.    Impression/Plan The patient is  almost 5 years from her original diagnosis and almost 4 years from the time of recurrence. She is not on any adjuvant hormonal therapy at this time given her negative Homan receptors the patient will have a left breast unilateral mammogram today 6, CBC and chemistries and is asked to to be consulted by Dr. Cicero Duck.  The patient is scheduled for follow-up in our office in September, 2013.    Chapin Arduini NP-C 09/12/2011, 11:46 AM

## 2011-09-12 NOTE — Telephone Encounter (Signed)
Previous breast pt of Dr. Eliot Ford.  New lump in L breast, questionable Paget's disease/lump.  Dr. Arline Asp would like seen this week by Dr. Jamey Ripa.  Pls call Melissa at [phone] asap to schedule.  Pt is scheduled for mgm/us at [facility] today.

## 2011-09-13 ENCOUNTER — Telehealth: Payer: Self-pay | Admitting: Oncology

## 2011-09-13 ENCOUNTER — Telehealth: Payer: Self-pay | Admitting: Medical Oncology

## 2011-09-13 NOTE — Telephone Encounter (Signed)
I called and left pt a message regarding her potassium of 3.3 on 09/12/11. I informed her she can eat bananas and or drink orange juice. I asked her to call me back if any questions or concerns.

## 2011-09-13 NOTE — Telephone Encounter (Signed)
Spoke with Melissa and moved appt to 09/16/11 @ 12:20 pm per Dr Jamey Ripa. Melissa will call patient and let her know date/time.

## 2011-09-13 NOTE — Telephone Encounter (Signed)
Received a call from Judy [name], Dr Judy [name] nurse. Per Judy [name] Dr. Jamey [name] will see Judy [name] 8/2 @ 12 pm. S/w pt this morning she is aware.

## 2011-09-16 ENCOUNTER — Ambulatory Visit (INDEPENDENT_AMBULATORY_CARE_PROVIDER_SITE_OTHER): Payer: Medicare Other | Admitting: Surgery

## 2011-09-16 ENCOUNTER — Encounter (INDEPENDENT_AMBULATORY_CARE_PROVIDER_SITE_OTHER): Payer: Self-pay | Admitting: Surgery

## 2011-09-16 VITALS — BP 130/82 | HR 70 | Temp 97.8°F | Ht 62.5 in | Wt 175.6 lb

## 2011-09-16 DIAGNOSIS — C50019 Malignant neoplasm of nipple and areola, unspecified female breast: Secondary | ICD-10-CM

## 2011-09-16 DIAGNOSIS — N6489 Other specified disorders of breast: Secondary | ICD-10-CM | POA: Insufficient documentation

## 2011-09-16 NOTE — Addendum Note (Signed)
Addended by: [name] on: 09/16/2011 12:32 PM   Modules accepted: Level of Service

## 2011-09-16 NOTE — Patient Instructions (Addendum)
We will call you next week with the result of your biopsy

## 2011-09-16 NOTE — Progress Notes (Addendum)
Chief complaint: Abnormal left nipple and abnormal mammogram  History of present illness: This patient was treated several years ago for DCIS of the left breast with a lumpectomy. She apparently had a local recurrence with a second lumpectomy. And actually took 3 separate attempts to get negative margins. She was treated with radiation therapy. This is a 2009. She now has noticed some change in the left nipple. Mammogram showed suspicious calcifications extending up into the nipple worrisome for DCIS and Paget's disease. Surgical evaluation was requested.  Past history family history and review of systems are noted in the left eye record not redictated in this note.  Exam: Vital signs:BP 130/82  Pulse 70  Temp 97.8 F (36.6 C) (Temporal)  Ht 5' 2.5" (1.588 m)  Wt 175 lb 9.6 oz (79.652 kg)  BMI 31.61 kg/m2  SpO2 97% Gen.: Patient alert oriented healthy-appearing Breasts: The right breast is normal. Left breast shows surgical scars in the upper outer quadrant. There is some slight retraction the nipple and the scan is abnormal overlying it. This is worrisome for some Paget's. However there is no dominant mass. Left breast is little smaller than the right.  Data review: I looked over the mammogram reports as well as our old paper medical records.  Impression: History of DCIS left breast with new abnormal mammogram suspicious for recurrent DCIS  Plan: I reviewed the situation with the patient. One option would be to do a punch biopsy of the nipple area and another would be to arrange a core biopsy of some of the calcifications. After discussion we elected to do a punch biopsy.  The area of the nipple was prepped with alcohol, anesthetized 1% Xylocaine, reprepped with Betadine, and 23 mm punch biopsies taken. She tolerated this nicely.  I will call her with the results. If this is negative she will need a needle core biopsy. I told her that if she has recurrent cancer she is going to need a  mastectomy.

## 2011-09-16 NOTE — Addendum Note (Signed)
Addended byLiliana Cline on: 09/16/2011 12:36 PM   Modules accepted: Orders

## 2011-09-21 ENCOUNTER — Encounter (INDEPENDENT_AMBULATORY_CARE_PROVIDER_SITE_OTHER): Payer: Self-pay | Admitting: Surgery

## 2011-09-21 ENCOUNTER — Telehealth (INDEPENDENT_AMBULATORY_CARE_PROVIDER_SITE_OTHER): Payer: Self-pay | Admitting: Surgery

## 2011-09-21 ENCOUNTER — Encounter: Payer: Self-pay | Admitting: Gastroenterology

## 2011-09-21 ENCOUNTER — Ambulatory Visit (AMBULATORY_SURGERY_CENTER): Payer: Medicare Other | Admitting: Gastroenterology

## 2011-09-21 VITALS — BP 181/92 | HR 73 | Temp 97.4°F | Resp 18 | Ht 63.0 in | Wt 174.0 lb

## 2011-09-21 DIAGNOSIS — Z800 Family history of malignant neoplasm of digestive organs: Secondary | ICD-10-CM

## 2011-09-21 DIAGNOSIS — Z1211 Encounter for screening for malignant neoplasm of colon: Secondary | ICD-10-CM

## 2011-09-21 MED ORDER — SODIUM CHLORIDE 0.9 % IV SOLN: 500.0000 mL | INTRAVENOUS | Status: DC

## 2011-09-21 NOTE — Op Note (Signed)
[facility] [address] [place], [place]  [address]  COLONOSCOPY PROCEDURE REPORT  PATIENT:  Judy [name], Judy [name]  MR#:  [id number] BIRTHDATE:  Oct 16, 1930, 78 yrs. old  GENDER:  female ENDOSCOPIST:  Judie Petit T. Russella Dar, MD, [facility]  PROCEDURE DATE:  09/21/2011 PROCEDURE:  Colonoscopy 62130 ASA CLASS:  Class II INDICATIONS:  1) Elevated Risk Screening  2) family history of colon cancer: brother age 35. MEDICATIONS:   MAC sedation, administered by CRNA, propofol (Diprivan) 120 mg IV DESCRIPTION OF PROCEDURE:   After the risks benefits and alternatives of the procedure were thoroughly explained, informed consent was obtained.  Digital rectal exam was performed and revealed no abnormalities.   The LB CF-[id number] endoscope was introduced through the anus and advanced to the cecum, which was identified by both the appendix and ileocecal valve, without limitations.  The quality of the prep was excellent, using MoviPrep.  The instrument was then slowly withdrawn as the colon was fully examined. <<PROCEDUREIMAGES>> FINDINGS:  Moderate diverticulosis was found in the sigmoid colon with associated spasm  Melanosis coli was found in the right colon, mild.  Otherwise normal colonoscopy without other polyps, masses, vascular ectasias, or inflammatory changes.   Retroflexed views in the rectum revealed internal hemorrhoids, small.  The time to cecum =  4.75  minutes. The scope was then withdrawn (time =  8  min) from the patient and the procedure completed.  COMPLICATIONS:  None  ENDOSCOPIC IMPRESSION: 1) Moderate diverticulosis in the sigmoid colon 2) Melanosis in the right colon 3) Internal hemorrhoids  RECOMMENDATIONS: 1) High fiber diet with liberal fluid intake. 2) Given your age, you will not need another colonoscopy for colon cancer screening or polyp surveillance. These types of tests usually stop around the age 69.  Venita Lick. Russella Dar, MD, [facility] [place]  CC:  Lupita Raider,  MD  n. Rosalie DoctorVenita Lick. Mariela Rex at 09/21/2011 10:22 AM  Wilhemina Bonito, [phone]

## 2011-09-21 NOTE — Progress Notes (Signed)
Patient did not have preoperative order for IV antibiotic SSI prophylaxis. (G8918)  Patient did not experience any of the following events: a burn prior to discharge; a fall within the facility; wrong site/side/patient/procedure/implant event; or a hospital transfer or hospital admission upon discharge from the facility. (G8907)  

## 2011-09-21 NOTE — Patient Instructions (Signed)
YOU HAD AN ENDOSCOPIC PROCEDURE TODAY AT THE  ENDOSCOPY CENTER: Refer to the procedure report that was given to you for any specific questions about what was found during the examination.  If the procedure report does not answer your questions, please call your gastroenterologist to clarify.  If you requested that your care partner not be given the details of your procedure findings, then the procedure report has been included in a sealed envelope for you to review at your convenience later.  YOU SHOULD EXPECT: Some feelings of bloating in the abdomen. Passage of more gas than usual.  Walking can help get rid of the air that was put into your GI tract during the procedure and reduce the bloating. If you had a lower endoscopy (such as a colonoscopy or flexible sigmoidoscopy) you may notice spotting of blood in your stool or on the toilet paper. If you underwent a bowel prep for your procedure, then you may not have a normal bowel movement for a few days.  DIET: Your first meal following the procedure should be a light meal and then it is ok to progress to your normal diet.  A half-sandwich or bowl of soup is an example of a good first meal.  Heavy or fried foods are harder to digest and may make you feel nauseous or bloated.  Likewise meals heavy in dairy and vegetables can cause extra gas to form and this can also increase the bloating.  Drink plenty of fluids but you should avoid alcoholic beverages for 24 hours.  ACTIVITY: Your care partner should take you home directly after the procedure.  You should plan to take it easy, moving slowly for the rest of the day.  You can resume normal activity the day after the procedure however you should NOT DRIVE or use heavy machinery for 24 hours (because of the sedation medicines used during the test).    SYMPTOMS TO REPORT IMMEDIATELY: A gastroenterologist can be reached at any hour.  During normal business hours, 8:30 AM to 5:00 PM Monday through Friday,  call [phone].  After hours and on weekends, please call the GI answering service at [phone] who will take a message and have the physician on call contact you.   Following lower endoscopy (colonoscopy or flexible sigmoidoscopy):  Excessive amounts of blood in the stool  Significant tenderness or worsening of abdominal pains  Swelling of the abdomen that is new, acute  Fever of 100F or higher    FOLLOW UP: If any biopsies were taken you will be contacted by phone or by letter within the next 1-3 weeks.  Call your gastroenterologist if you have not heard about the biopsies in 3 weeks.  Our staff will call the home number listed on your records the next business day following your procedure to check on you and address any questions or concerns that you may have at that time regarding the information given to you following your procedure. This is a courtesy call and so if there is no answer at the home number and we have not heard from you through the emergency physician on call, we will assume that you have returned to your regular daily activities without incident.  SIGNATURES/CONFIDENTIALITY: You and/or your care partner have signed paperwork which will be entered into your electronic medical record.  These signatures attest to the fact that that the information above on your After Visit Summary has been reviewed and is understood.  Full responsibility of the confidentiality   of this discharge information lies with you and/or your care-partner.   INFORMATION ON DIVERTICULOSIS,HEMORRHOIDS,&HIGH FIBER DIET GIVEN TO YOU TODAY 

## 2011-09-22 ENCOUNTER — Telehealth: Payer: Self-pay

## 2011-09-22 ENCOUNTER — Telehealth (INDEPENDENT_AMBULATORY_CARE_PROVIDER_SITE_OTHER): Payer: Self-pay | Admitting: General Surgery

## 2011-09-22 NOTE — Telephone Encounter (Signed)
  Follow up Call-  Call back number 09/21/2011  Post procedure Call Back phone  # [phone]  Permission to leave phone message Yes     Patient questions:  Do you have a fever, pain , or abdominal swelling? no Pain Score  0 *  Have you tolerated food without any problems? yes  Have you been able to return to your normal activities? yes  Do you have any questions about your discharge instructions: Diet   no Medications  no Follow up visit  no  Do you have questions or concerns about your Care? no  Actions: * If pain score is 4 or above: No action needed, pain <4.

## 2011-09-22 NOTE — Telephone Encounter (Signed)
Called and left message for Dr Murinson's nurse making her aware that patient does have recurrent breast cancer diagnosed from punch biopsy of the nipple. Told nurse on message I did not make a new referral since patient still sees Dr Arline Asp but she probably needs appt with him. Notes in EPIC. She is to call me back if she has any questions. Dr Jamey Ripa to discuss patient with Dr Arline Asp tomorrow.

## 2011-09-22 NOTE — Telephone Encounter (Signed)
Jade from Dr Tenna Child office called to let us know that punch biopsy was positive for breast cancer again. Pathology report forwarded to [facility].

## 2011-09-23 ENCOUNTER — Encounter (INDEPENDENT_AMBULATORY_CARE_PROVIDER_SITE_OTHER): Payer: Self-pay | Admitting: Surgery

## 2011-09-23 NOTE — Telephone Encounter (Signed)
Spoke with patient and discussed path report and told her again that I thought a mastectomy is needed. She is reluctant to consider that. She is going to see her oncologist, Dr Arline Asp to discuss

## 2011-09-26 ENCOUNTER — Other Ambulatory Visit: Payer: Self-pay | Admitting: Medical Oncology

## 2011-09-26 ENCOUNTER — Telehealth: Payer: Self-pay | Admitting: Medical Oncology

## 2011-09-26 ENCOUNTER — Telehealth (INDEPENDENT_AMBULATORY_CARE_PROVIDER_SITE_OTHER): Payer: Self-pay | Admitting: General Surgery

## 2011-09-26 ENCOUNTER — Encounter: Payer: Self-pay | Admitting: Oncology

## 2011-09-26 DIAGNOSIS — C50912 Malignant neoplasm of unspecified site of left female breast: Secondary | ICD-10-CM

## 2011-09-26 HISTORY — DX: Malignant neoplasm of unspecified site of left female breast: C50.912

## 2011-09-26 NOTE — Telephone Encounter (Signed)
Message copied by Liliana Cline on Mon Sep 26, 2011  2:58 PM ------      Message from: Cathi Roan      Created: Mon Sep 26, 2011  2:03 PM      Regarding: Waiting to hear       [phone] Patient wants to know  when she is going to hear from Dr. Jamey Ripa she wants to go on vacation

## 2011-09-26 NOTE — Telephone Encounter (Signed)
Left message for patient to call me back. 

## 2011-09-26 NOTE — Telephone Encounter (Signed)
I spoke with pt per Dr. Arline Asp to inform her he would like to get a PET scan to make the disease has not spread. Once we get this appointment scheduled we will get her in to see Dr. Arline Asp to discuss the results and further treatment options. She voiced understanding.

## 2011-09-26 NOTE — Progress Notes (Signed)
Case discussed with Dr. Donnie Coffin.  Punch biopsies of the left nipple area showed infiltrating carcinoma.  Hormone receptors were again negative.  HER2 status is pending.  Stage at this point is T4b with ulceration of skin around nipple/areola complex, i.e. at least IIIB.  Will try to obtain a PET scan.  Patient will most likely need a mastectomy.  She has previously received radiation.

## 2011-09-26 NOTE — Telephone Encounter (Signed)
Spoke with patient. She states she has not heard anything from [name] since last week. I told her I was advised to make her a follow up with Dr Arline Asp and I had left a message with his nurse last week to make patient a follow up appt. Patient will call to make an appt and I advised I would sent another message.

## 2011-09-27 ENCOUNTER — Telehealth: Payer: Self-pay | Admitting: Oncology

## 2011-09-27 NOTE — Telephone Encounter (Signed)
S/w pt re pet scan 8/21. No other orders.

## 2011-09-29 ENCOUNTER — Telehealth: Payer: Self-pay | Admitting: Oncology

## 2011-09-29 NOTE — Telephone Encounter (Signed)
called pt with 8/22 appt

## 2011-10-05 ENCOUNTER — Inpatient Hospital Stay (HOSPITAL_COMMUNITY): Admission: RE | Admit: 2011-10-05 | Payer: Medicare Other | Source: Ambulatory Visit

## 2011-10-06 ENCOUNTER — Encounter (HOSPITAL_COMMUNITY): Payer: Self-pay

## 2011-10-06 ENCOUNTER — Encounter (HOSPITAL_COMMUNITY)
Admission: RE | Admit: 2011-10-06 | Discharge: 2011-10-06 | Disposition: A | Discharge status: Home / Self Care | Payer: Medicare Other | Source: Ambulatory Visit | Attending: Oncology | Admitting: Oncology

## 2011-10-06 ENCOUNTER — Encounter: Payer: Self-pay | Admitting: Oncology

## 2011-10-06 ENCOUNTER — Telehealth: Payer: Self-pay | Admitting: Oncology

## 2011-10-06 ENCOUNTER — Ambulatory Visit (HOSPITAL_BASED_OUTPATIENT_CLINIC_OR_DEPARTMENT_OTHER): Payer: Medicare Other | Admitting: Oncology

## 2011-10-06 VITALS — BP 205/90 | HR 75 | Temp 97.0°F | Resp 20 | Ht 63.0 in | Wt 169.8 lb

## 2011-10-06 DIAGNOSIS — C50912 Malignant neoplasm of unspecified site of left female breast: Secondary | ICD-10-CM

## 2011-10-06 DIAGNOSIS — Z171 Estrogen receptor negative status [ER-]: Secondary | ICD-10-CM

## 2011-10-06 DIAGNOSIS — C50019 Malignant neoplasm of nipple and areola, unspecified female breast: Secondary | ICD-10-CM

## 2011-10-06 DIAGNOSIS — C50919 Malignant neoplasm of unspecified site of unspecified female breast: Secondary | ICD-10-CM | POA: Insufficient documentation

## 2011-10-06 DIAGNOSIS — Z87898 Personal history of other specified conditions: Secondary | ICD-10-CM

## 2011-10-06 LAB — GLUCOSE, CAPILLARY: Glucose-Capillary: 101 mg/dL — ABNORMAL HIGH (ref 70–99)

## 2011-10-06 MED ORDER — FLUDEOXYGLUCOSE F - 18 (FDG) INJECTION
19.1000 | Freq: Once | INTRAVENOUS | Status: AC | PRN
  Administered 2011-10-06: 19.1 via INTRAVENOUS

## 2011-10-06 NOTE — Progress Notes (Signed)
This office note has been dictated.  #[id number]

## 2011-10-06 NOTE — Telephone Encounter (Signed)
Pt aware of appt in September 2013 lab and MD

## 2011-10-07 ENCOUNTER — Telehealth: Payer: Self-pay | Admitting: Oncology

## 2011-10-07 ENCOUNTER — Other Ambulatory Visit (INDEPENDENT_AMBULATORY_CARE_PROVIDER_SITE_OTHER): Payer: Self-pay | Admitting: Surgery

## 2011-10-07 ENCOUNTER — Telehealth (INDEPENDENT_AMBULATORY_CARE_PROVIDER_SITE_OTHER): Payer: Self-pay | Admitting: General Surgery

## 2011-10-07 DIAGNOSIS — C50912 Malignant neoplasm of unspecified site of left female breast: Secondary | ICD-10-CM

## 2011-10-07 NOTE — Progress Notes (Signed)
CC:   Judy [name], M.D. Judy [name], M.D. Judy [name], M.D.  PROBLEM LIST: 1. Ductal carcinoma in situ, focal high-grade involving the left     breast, left upper quadrant, less than 0.5 cm.  Estrogen and     progesterone receptor negative, diagnosed by needle biopsy on     11/23/2006 with needle localization biopsy on 12/25/2006.  The     patient was lost to followup, although the plan was for her to have     radiation treatments at that time. When the patient reappeared about a year     later, she was found to have recurrent DCIS in October 2009.  She               underwent several excisions with negative margins by 01/16/2008.       She underwent radiation treatments to the breast from 03/03/2008     through 03/27/2008. 2. Infiltrating carcinoma involving the left breast, associated with     Paget's disease. Punch biopsies of the nipple area were obtained by     Dr. Cicero Duck on 09/16/2011.  There were infiltrating strands in     small clusters of atypical cells within the dermis.  The overlying     epidermis was ulcerated or detached and replaced by an inflamed     crust.  Estrogen and progesterone receptors are negative.  HER-     2/neu by [facility] shows amplification with a ratio of 4.68.  PET scan     carried out on 10/06/2011 shows no evidence for hypermetabolic     metastatic disease.  The patient will be undergoing definitive     surgery consisting of left-sided total mastectomy and left sentinel     lymph node sampling by Dr. Cicero Duck. 3. Hypertension. 4. Dyslipidemia. 5. Diverticulosis of the sigmoid colon. 6. Gastroesophageal reflux disease. 7. Mild leukopenia.  MEDICATIONS: 1. Cimetidine as needed. 2. Lisinopril 40 mg daily. 3. Multivitamins 1 daily. 4. Relafen 500 mg twice daily as needed. 5. Zocor 20 mg every evening.   HISTORY: Judy [name] is here today with her husband, Judy [name].  She was last seen by [name] on 09/12/2011 and prior to that on  03/17/2011 and then 08/27/2010. When the patient was seen approximately a month ago she was noted to have some crusting over the left nipple with some flattening and distortion of that nipple.  Subsequently, she underwent digital mammogram on 09/12/2011 that suggested recurrent tumor and recommended a skin punch biopsy.  That biopsy was carried out by Dr. Jamey Ripa on 09/16/2011 and showed infiltrating carcinoma associated with Paget's disease involving the left nipple.  Estrogen and progesterone receptors were negative.  However HER-2/neu was positive with a ratio of 4.68.  A PET scan carried out on 10/06/2011 does not show any distant metastatic disease.  The patient and her husband are here today for further discussions.  There has been no symptomatic change and nothing to suggest distant disease.  Of note, is the fact that the patient has lost some weight, which she attributes to being nervous about recent developments regarding her left breast.  PHYSICAL EXAM:  General: The patient looks well.  Vital signs: Weight today was 169.8 pounds, height 5 feet 3 inches, body surface area 1.85 meters squared.  Blood pressure today 205/90.  The patient states that her lisinopril was discontinued back about a month ago because of some swelling of her legs.  The patient was made aware of her  elevated blood pressure today.  Other vital signs are normal.  HEENT:  There is no scleral icterus.  Mouth and pharynx are benign.  No peripheral adenopathy palpable.  Especially in the axillary areas.  Heart and lungs:  Normal.  Right breast:  Benign.  Left breast:  Shows flattening and distortion of the left nipple which has been recently biopsied. This has healed well.  There is some scaling.  No palpable masses are present.  Abdomen:  Benign.  Extremities:  No peripheral edema.  No obvious lymphedema of the left arm.  Neurologic exam: Normal.  The patient is rather youthful appearing, 78 year old  woman.  LABORATORY DATA:  None was carried out today.  Lab data from 09/12/2011: White count 3.6, ANC 1.9, hemoglobin 13.0, hematocrit 38.6, platelets 217,000.  Chemistries were normal except for a potassium of 3.3. Albumin was 4.2.  Liver function tests including LDH were normal.  IMAGING STUDIES: 1. Digital diagnostic bilateral mammogram from 11/29/2010 showed no     evidence of malignancy in either breast.  Chest x-ray, 2 view, from     07/07/2011:  Showed stable chronic and postoperative changes. 2. Digital diagnostic left mammogram on 09/12/2011: Showed findings     highly suspicious for DCIS in the sub areola left breast and left     nipple.  There were new suspicious linearly oriented calcifications     in the sub areola, central and outer left breast that spanned     approximately 3.2 cm in maximum diameter.  Calcifications extended     to the nipple which is now retracted.  This is a new finding.  The     nipple also looked thickened in appearance.  There was no mass     associated with the left breast.  Skin punch biopsy of the left     nipple was suggested. 3. PET scan from 10/06/2011 showed some mild hypermetabolism about the     left breast skin, but there was no evidence for hypermetabolic     metastatic disease.  There was noted to be some hypermetabolism in     the right pelvic region felt to be most likely physiologic.  There     was an incidental left-sided renal calculus and prominent     collateral veins in the right lower extremity.  IMPRESSION AND PLAN:  Unfortunately, [name] now has infiltrating carcinoma involving the left breast diagnosed from a punch biopsy of the skin on 09/16/2011 after having undergone treatment for DCIS of the left breast, left upper quadrant back in October 2008.  The patient has never undergone any systemic therapy.  I have spoken to Dr. Jamey Ripa today who will arrange for the patient to have surgery sometime in early September.  I  had an extended meeting with the patient and her husband to explain to them sequence of events. I drew diagram's, I spent approximately 45 minutes with them going over recent studies specifically mammogram of the left breast, biopsy report and the PET scan.  Copies of these studies were given to the patient and her husband. The patient rather reluctantly understands the need for mastectomy and understands why the breast cannot be preserved at this point, having received definitive radiation treatments previously.  Dr. Jamey Ripa is planning to do a total mastectomy and a sentinel node sampling.  It is possible that this patient with skin involvement may have a T4 lesion.  HER-2 status is positive and hormone receptors are negative.  The patient may  be a candidate for adjuvant Herceptin therapy, possibly chemotherapy depending on the surgical findings. I have asked the patient to return in about a month.  I believe she has an appointment to see me again on September 26.  We will check CBC and chemistries at that time.    ______________________________ Samul Dada, M.D. DSM/MEDQ  D:  10/06/2011  T:  10/07/2011  Job:  [id number]

## 2011-10-07 NOTE — Telephone Encounter (Signed)
Talked to pt and gave her new appt time 11 am on 11/10/11

## 2011-10-07 NOTE — Telephone Encounter (Signed)
Spoke with pt and informed her that her pre-op appt w/ Dr. Jamey Ripa on 9/6 at 8:15.  She was fine with this.

## 2011-10-20 ENCOUNTER — Encounter (HOSPITAL_BASED_OUTPATIENT_CLINIC_OR_DEPARTMENT_OTHER): Payer: Self-pay | Admitting: *Deleted

## 2011-10-20 NOTE — Progress Notes (Signed)
To come in for bmet-ekg-had cxr 7/13 And PET scan Has had 3 lt lumpectomies-never snbx Very sharp lady Bring all meds, overnight bag-

## 2011-10-21 ENCOUNTER — Other Ambulatory Visit (INDEPENDENT_AMBULATORY_CARE_PROVIDER_SITE_OTHER): Payer: Self-pay | Admitting: Surgery

## 2011-10-21 ENCOUNTER — Encounter (HOSPITAL_BASED_OUTPATIENT_CLINIC_OR_DEPARTMENT_OTHER)
Admission: RE | Admit: 2011-10-21 | Discharge: 2011-10-21 | Disposition: A | Discharge status: Home / Self Care | Payer: Medicare Other | Source: Ambulatory Visit | Attending: Surgery | Admitting: Surgery

## 2011-10-21 ENCOUNTER — Telehealth (INDEPENDENT_AMBULATORY_CARE_PROVIDER_SITE_OTHER): Payer: Self-pay | Admitting: General Surgery

## 2011-10-21 ENCOUNTER — Ambulatory Visit (INDEPENDENT_AMBULATORY_CARE_PROVIDER_SITE_OTHER): Payer: Medicare Other | Admitting: Surgery

## 2011-10-21 ENCOUNTER — Encounter (INDEPENDENT_AMBULATORY_CARE_PROVIDER_SITE_OTHER): Payer: Self-pay | Admitting: Surgery

## 2011-10-21 VITALS — BP 140/84 | HR 64 | Resp 25 | Ht 62.5 in | Wt 171.0 lb

## 2011-10-21 DIAGNOSIS — C50919 Malignant neoplasm of unspecified site of unspecified female breast: Secondary | ICD-10-CM

## 2011-10-21 DIAGNOSIS — C50912 Malignant neoplasm of unspecified site of left female breast: Secondary | ICD-10-CM

## 2011-10-21 LAB — BASIC METABOLIC PANEL
BUN: 9 mg/dL (ref 6–23)
CO2: 33 mEq/L — ABNORMAL HIGH (ref 19–32)
Calcium: 9.9 mg/dL (ref 8.4–10.5)
Chloride: 101 mEq/L (ref 96–112)
Creatinine, Ser: 0.8 mg/dL (ref 0.50–1.10)
GFR calc Af Amer: 79 mL/min — ABNORMAL LOW (ref >90)
GFR calc non Af Amer: 68 mL/min — ABNORMAL LOW (ref >90)
Glucose, Bld: 91 mg/dL (ref 70–99)
Potassium: 3.3 mEq/L — ABNORMAL LOW (ref 3.5–5.1)
Sodium: 142 mEq/L (ref 135–145)

## 2011-10-21 MED ORDER — POTASSIUM CHLORIDE ER 10 MEQ PO TBCR: 20.0000 meq | EXTENDED_RELEASE_TABLET | Freq: Two times a day (BID) | ORAL | Status: DC

## 2011-10-21 NOTE — Telephone Encounter (Signed)
Patient aware and will start medication

## 2011-10-21 NOTE — Progress Notes (Signed)
NAME: Judy [name] DOB: 12/15/1930 MRN: [id number]                                                                                      DATE: 10/21/2011  PCP: SHAW,KIMBERLEE, MD Referring Provider: Shaw, Kimberlee, MD  IMPRESSION:  IDC, left breast, S/P prior lumpectomies and radiation for DCIS  PLAN:   Left total mastectomy and SLN. I have explained the pathophysiology and staging of breast cancer with particular attention to her exact situation. We discussed the multidisciplinary approach to breast cancer which often includes both medical and radiation oncology consultations.  We also discussed surgical options for the treatment of breast cancer including lumpectomy and mastectomy with possible reconstructive surgery. In addition we talked about the evaluation and management of lymph nodes including a description of sentinel lymph node biopsy and axillary dissections. We reviewed potential complications and risks including bleeding, infection, numbness,  lymphedema, and the potential need for additional surgery.  She understands that for patients who are candidate for lumpectomy or mastectomy there is an equal survival rate with either technique, but a slightly higher local recurrence rate with lumpectomy. In addition she knows that a lumpectomy usually requires postoperative radiation as part of the management of the breast cancer.  We have discussed the likely postoperative course and plans for followup.  I have given the patient some written information that reviewed all of these issues. I believe her questions are answered and that she has a good understanding of the issues.                  CC:  Chief Complaint  Patient presents with  . Pre-op Exam    mastectomy scheduled 10/25/11    HPI:  Judy [name] is a 78 y.o.  female who presents for evaluation of her recently dx IDC left breast. She has had prior lumpectomies for DCIS with radiation, then presented a few weeks ago with  nipple crusting and a punch biopsy showed Paget's but also IDC. She was recommended to have TM/SLN, and saw her oncologist pre-op who concurred. She comes in today for pre-op eval and discussion  PMH:  has a past medical history of Cancer of breast, intraductal; GERD (gastroesophageal reflux disease); HTN (hypertension); Dyslipidemia; Blood transfusion; and Cancer.  PSH:   has past surgical history that includes Rotator cuff repair; Total abdominal hysterectomy; Breast surgery (2009); and Back surgery (2009).  ALLERGIES:   Allergies  Allergen Reactions  . Amoxicillin Nausea Only  . Codeine Nausea Only    MEDICATIONS: Current outpatient prescriptions:cimetidine (TAGAMET) 300 MG tablet, Take 300 mg by mouth 4 (four) times daily., Disp: , Rfl: ;  hydrochlorothiazide (HYDRODIURIL) 25 MG tablet, Take 25 mg by mouth daily., Disp: , Rfl: ;  lisinopril (PRINIVIL,ZESTRIL) 40 MG tablet, Take 40 mg by mouth daily., Disp: , Rfl: ;  metoprolol succinate (TOPROL-XL) 25 MG 24 hr tablet, Take 25 mg by mouth daily., Disp: , Rfl:  Multiple Vitamin (MULTIVITAMIN) capsule, Take 1 capsule by mouth daily., Disp: , Rfl: ;  simvastatin (ZOCOR) 20 MG tablet, Take 20 mg by mouth every evening., Disp: , Rfl:   ROS: She   has filled out our 12 point review of systems and it is negative . EXAM:   VITAL SIGNS: BP 140/84  Pulse 64  Resp 25  Ht 5' 2.5" (1.588 m)  Wt 171 lb (77.565 kg)  BMI 30.78 kg/m2 GENERAL:  The patient is alert, oriented, and generally healthy-appearing, NAD. Mood and affect are normal.  HEENT:  The head is normocephalic, the eyes nonicteric, the pupils were round regular and equal. EOMs are normal. Pharynx normal. Dentition good.  NECK:  The neck is supple and there are no masses or thyromegaly.  LUNGS: Normal respirations and clear to auscultation.  HEART: Regular rhythm, with no murmurs rubs or gallops. Pulses are intact carotid dorsalis pedis and posterior tibial. No significant  varicosities are noted.  BREASTS:  Right normal. Left shows crusting of nipple and old surgical scars plus effects of radiation  ABDOMEN: Soft, flat, and nontender. No masses or organomegaly is noted. No hernias are noted. Bowel sounds are normal.  EXTREMITIES:  Good range of motion, no edema.   DATA REVIEWED:  Notes from Dr Murinson and path reports    Judy [name] 10/21/2011  CC: Shaw, Kimberlee, MD, SHAW,KIMBERLEE, MD        

## 2011-10-21 NOTE — Telephone Encounter (Signed)
LMOM for patient to call back and ask for me. Potassium was low. RX was sent to pharmacy. She needs to start now and continue twice a day until surgery.

## 2011-10-21 NOTE — Patient Instructions (Signed)
I will see you Tuesday just before your surgery. Call if you have any other questions

## 2011-10-21 NOTE — Telephone Encounter (Signed)
Message copied by Liliana Cline on Fri Oct 21, 2011 12:26 PM ------      Message from: [name]      Created: Fri Oct 21, 2011 12:21 PM       Lesly Rubenstein, I have put a rx in for some potassium supplementation

## 2011-10-21 NOTE — Progress Notes (Signed)
Quick Note:  Jade, I have put a rx in for some potassium supplementation ______

## 2011-10-25 ENCOUNTER — Encounter (HOSPITAL_BASED_OUTPATIENT_CLINIC_OR_DEPARTMENT_OTHER): Payer: Self-pay | Admitting: *Deleted

## 2011-10-25 ENCOUNTER — Encounter (HOSPITAL_BASED_OUTPATIENT_CLINIC_OR_DEPARTMENT_OTHER): Payer: Self-pay | Admitting: Anesthesiology

## 2011-10-25 ENCOUNTER — Encounter (HOSPITAL_BASED_OUTPATIENT_CLINIC_OR_DEPARTMENT_OTHER)
Admission: RE | Disposition: A | Discharge status: Home / Self Care | Payer: Self-pay | Source: Ambulatory Visit | Attending: Surgery

## 2011-10-25 ENCOUNTER — Ambulatory Visit (HOSPITAL_COMMUNITY)
Admission: RE | Admit: 2011-10-25 | Discharge: 2011-10-25 | Disposition: A | Discharge status: Home / Self Care | Payer: Medicare Other | Source: Ambulatory Visit | Attending: Surgery | Admitting: Surgery

## 2011-10-25 ENCOUNTER — Ambulatory Visit (HOSPITAL_BASED_OUTPATIENT_CLINIC_OR_DEPARTMENT_OTHER): Payer: Medicare Other | Admitting: Anesthesiology

## 2011-10-25 ENCOUNTER — Ambulatory Visit (HOSPITAL_BASED_OUTPATIENT_CLINIC_OR_DEPARTMENT_OTHER)
Admission: RE | Admit: 2011-10-25 | Discharge: 2011-10-26 | Disposition: A | Discharge status: Home / Self Care | Payer: Medicare Other | Source: Ambulatory Visit | Attending: Surgery | Admitting: Surgery

## 2011-10-25 DIAGNOSIS — Z01810 Encounter for preprocedural cardiovascular examination: Secondary | ICD-10-CM | POA: Insufficient documentation

## 2011-10-25 DIAGNOSIS — K219 Gastro-esophageal reflux disease without esophagitis: Secondary | ICD-10-CM | POA: Insufficient documentation

## 2011-10-25 DIAGNOSIS — D0590 Unspecified type of carcinoma in situ of unspecified breast: Secondary | ICD-10-CM | POA: Insufficient documentation

## 2011-10-25 DIAGNOSIS — C50912 Malignant neoplasm of unspecified site of left female breast: Secondary | ICD-10-CM | POA: Diagnosis present

## 2011-10-25 DIAGNOSIS — I10 Essential (primary) hypertension: Secondary | ICD-10-CM | POA: Insufficient documentation

## 2011-10-25 DIAGNOSIS — C50919 Malignant neoplasm of unspecified site of unspecified female breast: Secondary | ICD-10-CM | POA: Insufficient documentation

## 2011-10-25 DIAGNOSIS — Z01812 Encounter for preprocedural laboratory examination: Secondary | ICD-10-CM | POA: Insufficient documentation

## 2011-10-25 LAB — POCT HEMOGLOBIN-HEMACUE: Hemoglobin: 12.5 g/dL (ref 12.0–15.0)

## 2011-10-25 SURGERY — SIMPLE MASTECTOMY WITH AXILLARY SENTINEL NODE BIOPSY
Anesthesia: General | Site: Breast | Laterality: Left | Wound class: Clean

## 2011-10-25 MED ORDER — METOPROLOL SUCCINATE ER 25 MG PO TB24: 25.0000 mg | ORAL_TABLET | Freq: Every day | ORAL | Status: DC

## 2011-10-25 MED ORDER — FENTANYL CITRATE 0.05 MG/ML IJ SOLN
50.0000 ug | INTRAMUSCULAR | Status: DC | PRN
  Administered 2011-10-25 (×2): 50 ug via INTRAVENOUS

## 2011-10-25 MED ORDER — SIMVASTATIN 20 MG PO TABS: 20.0000 mg | ORAL_TABLET | Freq: Every evening | ORAL | Status: DC

## 2011-10-25 MED ORDER — OXYCODONE HCL 5 MG/5ML PO SOLN: 5.0000 mg | Freq: Once | ORAL | Status: AC | PRN

## 2011-10-25 MED ORDER — LACTATED RINGERS IV SOLN
INTRAVENOUS | Status: DC
  Administered 2011-10-25 (×2): via INTRAVENOUS

## 2011-10-25 MED ORDER — MORPHINE SULFATE 2 MG/ML IJ SOLN: 2.0000 mg | INTRAMUSCULAR | Status: DC | PRN

## 2011-10-25 MED ORDER — HYDROCODONE-ACETAMINOPHEN 5-325 MG PO TABS: 1.0000 | ORAL_TABLET | ORAL | Status: DC | PRN

## 2011-10-25 MED ORDER — CHLORHEXIDINE GLUCONATE 4 % EX LIQD: 1.0000 "application " | Freq: Once | CUTANEOUS | Status: DC

## 2011-10-25 MED ORDER — POTASSIUM CHLORIDE ER 10 MEQ PO TBCR: 20.0000 meq | EXTENDED_RELEASE_TABLET | Freq: Two times a day (BID) | ORAL | Status: DC

## 2011-10-25 MED ORDER — ONDANSETRON HCL 4 MG/2ML IJ SOLN
INTRAMUSCULAR | Status: DC | PRN
  Administered 2011-10-25: 4 mg via INTRAVENOUS

## 2011-10-25 MED ORDER — LIDOCAINE HCL (CARDIAC) 20 MG/ML IV SOLN
INTRAVENOUS | Status: DC | PRN
  Administered 2011-10-25: 100 mg via INTRAVENOUS

## 2011-10-25 MED ORDER — HYDROMORPHONE HCL PF 1 MG/ML IJ SOLN
0.2500 mg | INTRAMUSCULAR | Status: DC | PRN
  Administered 2011-10-25 (×3): 0.5 mg via INTRAVENOUS

## 2011-10-25 MED ORDER — HEPARIN SODIUM (PORCINE) 5000 UNIT/ML IJ SOLN: 5000.0000 [IU] | Freq: Three times a day (TID) | INTRAMUSCULAR | Status: DC

## 2011-10-25 MED ORDER — DEXTROSE IN LACTATED RINGERS 5 % IV SOLN
INTRAVENOUS | Status: DC
  Administered 2011-10-25: 75 mL/h via INTRAVENOUS

## 2011-10-25 MED ORDER — TECHNETIUM TC 99M SULFUR COLLOID FILTERED
1.0000 | Freq: Once | INTRAVENOUS | Status: AC | PRN
  Administered 2011-10-25: 1 via INTRADERMAL

## 2011-10-25 MED ORDER — MIDAZOLAM HCL 2 MG/2ML IJ SOLN
1.0000 mg | INTRAMUSCULAR | Status: DC | PRN
  Administered 2011-10-25 (×2): 1 mg via INTRAVENOUS

## 2011-10-25 MED ORDER — MEPERIDINE HCL 25 MG/ML IJ SOLN: 6.2500 mg | INTRAMUSCULAR | Status: DC | PRN

## 2011-10-25 MED ORDER — CEFAZOLIN SODIUM-DEXTROSE 2-3 GM-% IV SOLR
2.0000 g | INTRAVENOUS | Status: AC
  Administered 2011-10-25: 2 g via INTRAVENOUS

## 2011-10-25 MED ORDER — ONDANSETRON HCL 4 MG/2ML IJ SOLN: 4.0000 mg | Freq: Four times a day (QID) | INTRAMUSCULAR | Status: DC | PRN

## 2011-10-25 MED ORDER — DEXAMETHASONE SODIUM PHOSPHATE 4 MG/ML IJ SOLN
INTRAMUSCULAR | Status: DC | PRN
  Administered 2011-10-25: 10 mg via INTRAVENOUS

## 2011-10-25 MED ORDER — HYDROCHLOROTHIAZIDE 25 MG PO TABS: 25.0000 mg | ORAL_TABLET | Freq: Every day | ORAL | Status: DC

## 2011-10-25 MED ORDER — ONDANSETRON HCL 4 MG PO TABS: 4.0000 mg | ORAL_TABLET | Freq: Four times a day (QID) | ORAL | Status: DC | PRN

## 2011-10-25 MED ORDER — FENTANYL CITRATE 0.05 MG/ML IJ SOLN
INTRAMUSCULAR | Status: DC | PRN
  Administered 2011-10-25 (×4): 25 ug via INTRAVENOUS

## 2011-10-25 MED ORDER — PROPOFOL 10 MG/ML IV BOLUS
INTRAVENOUS | Status: DC | PRN
Start: 2011-10-25 — End: 2011-10-25
  Administered 2011-10-25: 150 mg via INTRAVENOUS

## 2011-10-25 MED ORDER — LISINOPRIL 40 MG PO TABS: 40.0000 mg | ORAL_TABLET | Freq: Every day | ORAL | Status: DC

## 2011-10-25 MED ORDER — OXYCODONE HCL 5 MG PO TABS: 5.0000 mg | ORAL_TABLET | Freq: Once | ORAL | Status: AC | PRN

## 2011-10-25 MED ORDER — ONDANSETRON HCL 4 MG/2ML IJ SOLN
4.0000 mg | Freq: Once | INTRAMUSCULAR | Status: AC | PRN
  Administered 2011-10-25: 4 mg via INTRAVENOUS

## 2011-10-25 SURGICAL SUPPLY — 61 items
APPLIER CLIP 11 MED OPEN (CLIP) ×2
APPLIER CLIP 9.375 MED OPEN (MISCELLANEOUS)
BANDAGE ELASTIC 6 VELCRO ST LF (GAUZE/BANDAGES/DRESSINGS) IMPLANT
BIOPATCH RED 1 DISK 7.0 (GAUZE/BANDAGES/DRESSINGS) ×2 IMPLANT
BLADE HEX COATED 2.75 (ELECTRODE) ×2 IMPLANT
BLADE SURG 10 STRL SS (BLADE) ×2 IMPLANT
BLADE SURG 15 STRL LF DISP TIS (BLADE) ×1 IMPLANT
BLADE SURG 15 STRL SS (BLADE) ×1
BLADE SURG ROTATE 9660 (MISCELLANEOUS) IMPLANT
CANISTER SUCTION 1200CC (MISCELLANEOUS) ×2 IMPLANT
CHLORAPREP W/TINT 26ML (MISCELLANEOUS) ×2 IMPLANT
CLIP APPLIE 11 MED OPEN (CLIP) ×1 IMPLANT
CLIP APPLIE 9.375 MED OPEN (MISCELLANEOUS) IMPLANT
CLOTH BEACON ORANGE TIMEOUT ST (SAFETY) ×2 IMPLANT
COVER MAYO STAND STRL (DRAPES) ×2 IMPLANT
COVER PROBE W GEL 5X96 (DRAPES) ×2 IMPLANT
COVER TABLE BACK 60X90 (DRAPES) ×2 IMPLANT
DECANTER SPIKE VIAL GLASS SM (MISCELLANEOUS) IMPLANT
DERMABOND ADVANCED (GAUZE/BANDAGES/DRESSINGS) ×2
DERMABOND ADVANCED .7 DNX12 (GAUZE/BANDAGES/DRESSINGS) ×2 IMPLANT
DRAIN CHANNEL 19F RND (DRAIN) ×2 IMPLANT
DRAPE LAPAROSCOPIC ABDOMINAL (DRAPES) IMPLANT
DRAPE SURG 17X23 STRL (DRAPES) ×2 IMPLANT
DRAPE U-SHAPE 76X120 STRL (DRAPES) IMPLANT
DRAPE UTILITY XL STRL (DRAPES) ×2 IMPLANT
DRSG TEGADERM 2-3/8X2-3/4 SM (GAUZE/BANDAGES/DRESSINGS) ×2 IMPLANT
ELECT BLADE 4.0 EZ CLEAN MEGAD (MISCELLANEOUS) ×2
ELECT REM PT RETURN 9FT ADLT (ELECTROSURGICAL) ×2
ELECTRODE BLDE 4.0 EZ CLN MEGD (MISCELLANEOUS) ×1 IMPLANT
ELECTRODE REM PT RTRN 9FT ADLT (ELECTROSURGICAL) ×1 IMPLANT
EVACUATOR SILICONE 100CC (DRAIN) ×2 IMPLANT
GLOVE BIO SURGEON STRL SZ 6.5 (GLOVE) ×2 IMPLANT
GLOVE BIOGEL PI IND STRL 6.5 (GLOVE) ×1 IMPLANT
GLOVE BIOGEL PI INDICATOR 6.5 (GLOVE) ×1
GLOVE EUDERMIC 7 POWDERFREE (GLOVE) ×2 IMPLANT
GOWN PREVENTION PLUS XLARGE (GOWN DISPOSABLE) ×4 IMPLANT
NDL SAFETY ECLIPSE 18X1.5 (NEEDLE) ×2 IMPLANT
NEEDLE HYPO 18GX1.5 SHARP (NEEDLE) ×2
NEEDLE HYPO 25X1 1.5 SAFETY (NEEDLE) ×6 IMPLANT
NS IRRIG 1000ML POUR BTL (IV SOLUTION) ×2 IMPLANT
PACK BASIN DAY SURGERY FS (CUSTOM PROCEDURE TRAY) ×2 IMPLANT
PEN SKIN MARKING BROAD TIP (MISCELLANEOUS) IMPLANT
PENCIL BUTTON HOLSTER BLD 10FT (ELECTRODE) ×2 IMPLANT
PIN SAFETY STERILE (MISCELLANEOUS) ×2 IMPLANT
SHEET MEDIUM DRAPE 40X70 STRL (DRAPES) ×2 IMPLANT
SLEEVE SCD COMPRESS KNEE MED (MISCELLANEOUS) ×2 IMPLANT
SPONGE GAUZE 4X4 12PLY (GAUZE/BANDAGES/DRESSINGS) ×2 IMPLANT
SPONGE LAP 18X18 X RAY DECT (DISPOSABLE) ×2 IMPLANT
SPONGE LAP 4X18 X RAY DECT (DISPOSABLE) ×2 IMPLANT
STAPLER VISISTAT 35W (STAPLE) ×2 IMPLANT
SUT ETHILON 2 0 FS 18 (SUTURE) ×2 IMPLANT
SUT MNCRL AB 3-0 PS2 18 (SUTURE) ×8 IMPLANT
SUT MNCRL AB 4-0 PS2 18 (SUTURE) IMPLANT
SUT SILK 2 0 SH (SUTURE) IMPLANT
SUT VICRYL 3-0 CR8 SH (SUTURE) ×2 IMPLANT
SYR CONTROL 10ML LL (SYRINGE) ×4 IMPLANT
TOWEL OR 17X24 6PK STRL BLUE (TOWEL DISPOSABLE) ×2 IMPLANT
TOWEL OR NON WOVEN STRL DISP B (DISPOSABLE) ×2 IMPLANT
TUBE CONNECTING 20X1/4 (TUBING) ×2 IMPLANT
WATER STERILE IRR 1000ML POUR (IV SOLUTION) IMPLANT
YANKAUER SUCT BULB TIP NO VENT (SUCTIONS) ×6 IMPLANT

## 2011-10-25 NOTE — Anesthesia Postprocedure Evaluation (Signed)
Anesthesia Post Note  Patient: Judy [name]  Procedure(s) Performed: Procedure(s) (LRB): SIMPLE MASTECTOMY WITH AXILLARY SENTINEL NODE BIOPSY (Left)  Anesthesia type: General  Patient location: PACU  Post pain: Pain level controlled  Post assessment: Patient's Cardiovascular Status Stable  Last Vitals:  Filed Vitals:   10/25/11 1348  BP: 195/89  Pulse: 58  Temp:   Resp: 16    Post vital signs: Reviewed and stable  Level of consciousness: alert  Complications: No apparent anesthesia complications

## 2011-10-25 NOTE — Transfer of Care (Signed)
Immediate Anesthesia Transfer of Care Note  Patient: Judy [name]  Procedure(s) Performed: Procedure(s) (LRB) with comments: SIMPLE MASTECTOMY WITH AXILLARY SENTINEL NODE BIOPSY (Left) - left total mastectomy and sentinel node  Patient Location: PACU  Anesthesia Type: General  Level of Consciousness: awake, alert  and oriented  Airway & Oxygen Therapy: Patient Spontanous Breathing and Patient connected to face mask oxygen  Post-op Assessment: Report given to PACU RN and Post -op Vital signs reviewed and stable  Post vital signs: Reviewed and stable  Complications: No apparent anesthesia complications

## 2011-10-25 NOTE — Anesthesia Procedure Notes (Signed)
Procedure Name: LMA Insertion Date/Time: 10/25/2011 10:37 AM Performed by: Caren Macadam Pre-anesthesia Checklist: Patient identified, Emergency Drugs available, Suction available and Patient being monitored Patient Re-evaluated:Patient Re-evaluated prior to inductionOxygen Delivery Method: Circle System Utilized Preoxygenation: Pre-oxygenation with 100% oxygen Intubation Type: IV induction Ventilation: Mask ventilation without difficulty LMA: LMA inserted LMA Size: 4.0 Number of attempts: 1 Airway Equipment and Method: bite block Placement Confirmation: positive ETCO2 and breath sounds checked- equal and bilateral Tube secured with: Tape Dental Injury: Teeth and Oropharynx as per pre-operative assessment

## 2011-10-25 NOTE — Op Note (Signed)
Judy [name] 1930/02/27 [id number] 10/07/2011  Preoperative diagnosis: Recurrent left breast cancer, central, clinical stage I  Postoperative diagnosis: Same  Procedure: Left total mastectomy with blue dye injection and sentinel node excision  Surgeon: [name]   Assistant surgeon: None  Anesthesia:General  Clinical History and Indications:The patient is seen in the holding area and we reviewed the plans for the procedure as noted above. We reviewed the risks and complications a final time. She had no further questions. I marked theleft, right side as the operative side.  Description of Procedure: The patient was taken to the operating room. After satisfactory general anesthesia was obtained the timeout was done.   I then injected 5 cc of dilute methylene blue and injected it subareaorly and massaged it in. A full prep and drape was then done.  I outlined an elliptical incision and marked the inframammary fold and midline. The incision was made. The usual skin flaps were raised. I went to the clavicle superiorly and sternum medially and towards the latissimus laterally.    After the superior flap was complete I was able to use the neoprobe to locate a sentinel node.  Two blue hot sentinel nodes were removed.   Once the nodes were removed they were forwarded to pathology.   The inferior flap was then made going to the inframammary fold and out to the latissimus. The breast was then removed from the pectoralis starting medially and working laterally. When I got to the lateral aspect of the pectoralis major muscle I opened the clavipectoral fascia. I finished removing the breast from the serratus muscles.  At this point the pathologist reported on the sentinel node and that it was negative.  I therefore completed the mastectomy by dividing the remaining attachments from the breast to the chest wall and latissimus but not taking any more tissue from the axilla. The specimen was  marked to orient it for the pathologist and passed off the table.  I spent several minutes irrigating and making sure everything was dry. I then placed a 37 [place] Blake drain through a small incision in the inferior flap and laid along the pectoralis muscle. It was secured with a 2-0 nylon suture.  Another irrigation and check for hemostasis was made. Everything appeared to be dry. Incision was then closed with interrupted 3-0 Monocryl and Dermabond. The patient tolerated the procedure well. There were no operative complications. All counts were correct. Estimated blood loss was<100 cc. Sterile dressings were applied and the patient taken to the PACU in satisfactory condition.  [name], MD, FACS 10/25/2011 12:13 PM

## 2011-10-25 NOTE — H&P (View-Only) (Signed)
NAME: Judy [name] DOB: 15-May-1930 MRN: [id number]                                                                                      DATE: 10/21/2011  PCP: Lupita Raider, MD Referring Provider: Lupita Raider, MD  IMPRESSION:  IDC, left breast, S/P prior lumpectomies and radiation for DCIS  PLAN:   Left total mastectomy and SLN. I have explained the pathophysiology and staging of breast cancer with particular attention to her exact situation. We discussed the multidisciplinary approach to breast cancer which often includes both medical and radiation oncology consultations.  We also discussed surgical options for the treatment of breast cancer including lumpectomy and mastectomy with possible reconstructive surgery. In addition we talked about the evaluation and management of lymph nodes including a description of sentinel lymph node biopsy and axillary dissections. We reviewed potential complications and risks including bleeding, infection, numbness,  lymphedema, and the potential need for additional surgery.  She understands that for patients who are candidate for lumpectomy or mastectomy there is an equal survival rate with either technique, but a slightly higher local recurrence rate with lumpectomy. In addition she knows that a lumpectomy usually requires postoperative radiation as part of the management of the breast cancer.  We have discussed the likely postoperative course and plans for followup.  I have given the patient some written information that reviewed all of these issues. I believe her questions are answered and that she has a good understanding of the issues.                  CC:  Chief Complaint  Patient presents with  . Pre-op Exam    mastectomy scheduled 10/25/11    HPI:  Judy [name] is a 78 y.o.  female who presents for evaluation of her recently dx IDC left breast. She has had prior lumpectomies for DCIS with radiation, then presented a few weeks ago with  nipple crusting and a punch biopsy showed Paget's but also IDC. She was recommended to have TM/SLN, and saw her oncologist pre-op who concurred. She comes in today for pre-op eval and discussion  PMH:  has a past medical history of Cancer of breast, intraductal; GERD (gastroesophageal reflux disease); HTN (hypertension); Dyslipidemia; Blood transfusion; and Cancer.  PSH:   has past surgical history that includes Rotator cuff repair; Total abdominal hysterectomy; Breast surgery (2009); and Back surgery (2009).  ALLERGIES:   Allergies  Allergen Reactions  . Amoxicillin Nausea Only  . Codeine Nausea Only    MEDICATIONS: Current outpatient prescriptions:cimetidine (TAGAMET) 300 MG tablet, Take 300 mg by mouth 4 (four) times daily., Disp: , Rfl: ;  hydrochlorothiazide (HYDRODIURIL) 25 MG tablet, Take 25 mg by mouth daily., Disp: , Rfl: ;  lisinopril (PRINIVIL,ZESTRIL) 40 MG tablet, Take 40 mg by mouth daily., Disp: , Rfl: ;  metoprolol succinate (TOPROL-XL) 25 MG 24 hr tablet, Take 25 mg by mouth daily., Disp: , Rfl:  Multiple Vitamin (MULTIVITAMIN) capsule, Take 1 capsule by mouth daily., Disp: , Rfl: ;  simvastatin (ZOCOR) 20 MG tablet, Take 20 mg by mouth every evening., Disp: , Rfl:   ROS: She  has filled out our 12 point review of systems and it is negative . EXAM:   VITAL SIGNS: BP 140/84  Pulse 64  Resp 25  Ht 5' 2.5" (1.588 m)  Wt 171 lb (77.565 kg)  BMI 30.78 kg/m2 GENERAL:  The patient is alert, oriented, and generally healthy-appearing, NAD. Mood and affect are normal.  HEENT:  The head is normocephalic, the eyes nonicteric, the pupils were round regular and equal. EOMs are normal. Pharynx normal. Dentition good.  NECK:  The neck is supple and there are no masses or thyromegaly.  LUNGS: Normal respirations and clear to auscultation.  HEART: Regular rhythm, with no murmurs rubs or gallops. Pulses are intact carotid dorsalis pedis and posterior tibial. No significant  varicosities are noted.  BREASTS:  Right normal. Left shows crusting of nipple and old surgical scars plus effects of radiation  ABDOMEN: Soft, flat, and nontender. No masses or organomegaly is noted. No hernias are noted. Bowel sounds are normal.  EXTREMITIES:  Good range of motion, no edema.   DATA REVIEWED:  Notes from Dr Arline Asp and path reports    Cara Aguino J 10/21/2011  CC: Lupita Raider, MD, Lupita Raider, MD

## 2011-10-25 NOTE — Anesthesia Preprocedure Evaluation (Signed)
Anesthesia Evaluation  Patient identified by MRN, date of birth, ID band Patient awake    Reviewed: Allergy & Precautions, H&P , NPO status , Patient's Chart, lab work & pertinent test results  Airway Mallampati: I TM Distance: >3 FB Neck ROM: Full    Dental   Pulmonary          Cardiovascular hypertension, Pt. on medications     Neuro/Psych    GI/Hepatic GERD-  Medicated and Controlled,  Endo/Other    Renal/GU      Musculoskeletal   Abdominal   Peds  Hematology   Anesthesia Other Findings   Reproductive/Obstetrics                           Anesthesia Physical Anesthesia Plan  ASA: II  Anesthesia Plan: General   Post-op Pain Management:    Induction: Intravenous  Airway Management Planned: LMA  Additional Equipment:   Intra-op Plan:   Post-operative Plan: Extubation in OR  Informed Consent: I have reviewed the patients History and Physical, chart, labs and discussed the procedure including the risks, benefits and alternatives for the proposed anesthesia with the patient or authorized representative who has indicated his/her understanding and acceptance.     Plan Discussed with: CRNA and Surgeon  Anesthesia Plan Comments:         Anesthesia Quick Evaluation  

## 2011-10-25 NOTE — Interval H&P Note (Signed)
History and Physical Interval Note:  10/25/2011 10:16 AM  Judy [name]  has presented today for surgery, with the diagnosis of recurrent left breast cancer  The various methods of treatment have been discussed with the patient and family. After consideration of risks, benefits and other options for treatment, the patient has consented to  Procedure(s) (LRB) with comments: SIMPLE MASTECTOMY WITH AXILLARY SENTINEL NODE BIOPSY (Left) - left total mastectomy and sentinel node as a surgical intervention .  The patient's history has been reviewed, patient examined, no change in status, stable for surgery.  I have reviewed the patient's chart and labs.  Questions were answered to the patient's satisfaction.    I marked the left breast as the operative site.   Cherrell Maybee J

## 2011-10-26 ENCOUNTER — Telehealth (INDEPENDENT_AMBULATORY_CARE_PROVIDER_SITE_OTHER): Payer: Self-pay | Admitting: General Surgery

## 2011-10-26 MED ORDER — HYDROCODONE-ACETAMINOPHEN 5-325 MG PO TABS: 1.0000 | ORAL_TABLET | ORAL | Status: AC | PRN

## 2011-10-26 NOTE — Telephone Encounter (Signed)
Message copied by Liliana Cline on Wed Oct 26, 2011  9:15 AM ------      Message from: [name]      Created: Wed Oct 26, 2011  8:06 AM       Discharged today post mastectomy - will need to be seen next week by someone for wound check. Please call with appointment

## 2011-10-26 NOTE — Progress Notes (Signed)
1 Day Post-Op  Subjective: Getting dressed to go home when I came in to see her. No pain, voices knowledge of JP management. Tolerating diet  Objective: Vital signs in last 24 hours: Temp:  [97.6 F (36.4 C)-98.1 F (36.7 C)] 98.1 F (36.7 C) (09/11 0645) Pulse Rate:  [53-82] 73  (09/11 0645) Resp:  [9-23] 16  (09/11 0645) BP: (154-201)/(69-145) 168/85 mmHg (09/11 0645) SpO2:  [88 %-100 %] 99 % (09/11 0645)   Intake/Output from previous day: 09/10 0701 - 09/11 0700 In: 2702 [P.O.:702; I.V.:2000] Out: 1651 [Urine:1450; Drains:201] Intake/Output this shift:     General appearance: alert, cooperative and no distress Resp: clear to auscultation bilaterally  Incision: healing well, Thin JP fluid  Lab Results:   Basename 10/25/11 0953  WBC --  HGB 12.5  HCT --  PLT --   BMET No results found for this basename: NA:2,K:2,CL:2,CO2:2,GLUCOSE:2,BUN:2,CREATININE:2,CALCIUM:2 in the last 72 hours PT/INR No results found for this basename: LABPROT:2,INR:2 in the last 72 hours ABG No results found for this basename: PHART:2,PCO2:2,PO2:2,HCO3:2 in the last 72 hours  MEDS, Scheduled    .  ceFAZolin (ANCEF) IV  2 g Intravenous 60 min Pre-Op  . heparin  5,000 Units Subcutaneous Q8H  . hydrochlorothiazide  25 mg Oral Daily  . lisinopril  40 mg Oral Daily  . metoprolol succinate  25 mg Oral Daily  . potassium chloride  20 mEq Oral BID  . simvastatin  20 mg Oral QPM  . DISCONTD: chlorhexidine  1 application Topical Once  . DISCONTD: chlorhexidine  1 application Topical Once    Studies/Results: Nm Sentinel Node Inj-no Rpt (breast)  10/25/2011  CLINICAL DATA: breast cancer   Sulfur colloid was injected intradermally by the nuclear medicine  technologist for breast cancer sentinel node localization.      Assessment: s/p Procedure(s): SIMPLE MASTECTOMY WITH AXILLARY SENTINEL NODE BIOPSY Patient Active Problem List  Diagnosis  . DCIS (ductal carcinoma in situ) of breast  .  Breast cancer, left    Doing well and able to go home  Plan: Discharge   LOS: 1 day     [name], MD, [facility], [place] [phone]   10/26/2011 8:03 AM

## 2011-10-26 NOTE — Telephone Encounter (Signed)
Appt made with Dr Derrell Lolling for next week. Patient aware.

## 2011-10-28 ENCOUNTER — Telehealth (INDEPENDENT_AMBULATORY_CARE_PROVIDER_SITE_OTHER): Payer: Self-pay | Admitting: General Surgery

## 2011-10-28 NOTE — Telephone Encounter (Signed)
Pt called to ask if her drain was accumulating too much, as her husband was emptying it three times a day.  Pt reassured that the drain was working properly and the amount would gradually decrease over time.  Continue to empty and record amounts, then bring the tally sheet to her appt next week for the physician to evaluate.  She understands and will comply.

## 2011-10-31 ENCOUNTER — Telehealth (INDEPENDENT_AMBULATORY_CARE_PROVIDER_SITE_OTHER): Payer: Self-pay | Admitting: General Surgery

## 2011-10-31 NOTE — Telephone Encounter (Signed)
Patient aware path results are good. Lymph nodes negative and margins ok. She will follow up in the office at her scheduled appt and call with any questions prior.  

## 2011-10-31 NOTE — Telephone Encounter (Signed)
Message copied by Liliana Cline on Mon Oct 31, 2011  1:28 PM ------      Message from: [name]      Created: Mon Oct 31, 2011  1:23 PM       Tell the patient that her margins are OK and her lymph nodes are negative. I will discuss in detail in the office.

## 2011-11-02 ENCOUNTER — Ambulatory Visit (INDEPENDENT_AMBULATORY_CARE_PROVIDER_SITE_OTHER): Payer: Medicare Other | Admitting: General Surgery

## 2011-11-02 ENCOUNTER — Encounter (INDEPENDENT_AMBULATORY_CARE_PROVIDER_SITE_OTHER): Payer: Self-pay | Admitting: General Surgery

## 2011-11-02 VITALS — BP 134/80 | HR 72 | Temp 97.6°F | Resp 16 | Ht 62.5 in | Wt 169.0 lb

## 2011-11-02 DIAGNOSIS — C50919 Malignant neoplasm of unspecified site of unspecified female breast: Secondary | ICD-10-CM

## 2011-11-02 DIAGNOSIS — C50912 Malignant neoplasm of unspecified site of left female breast: Secondary | ICD-10-CM

## 2011-11-02 NOTE — Progress Notes (Signed)
Subjective:     Patient ID: Judy [name], female   DOB: 1930-04-25, 78 y.o.   MRN: [id number]  HPI The patient is a 78 year old female postop from a left mastectomy on 10/25/2011. Patient presents for wound check as well as concern for JP drain output. Per patient's records JP has been putting out approximately 25-30 cc per day.  The patient describes some areas of pain medially and an area of ecchymosis.  Review of Systems  Constitutional: Negative.   HENT: Negative.   Eyes: Negative.   Respiratory: Negative.   Cardiovascular: Negative.   Gastrointestinal: Negative.   Neurological: Negative.        Objective:   Physical Exam  Constitutional: She is oriented to person, place, and time. She appears well-developed and well-nourished.  HENT:  Head: Normocephalic and atraumatic.  Eyes: Conjunctivae normal and EOM are normal. Pupils are equal, round, and reactive to light.  Neck: Normal range of motion. Neck supple.  Cardiovascular: Normal rate and regular rhythm.   Pulmonary/Chest:       Appropriate tenderness palpation. Medial wound ecchymosis. Serosanguineous JP drain output.  Musculoskeletal: Normal range of motion.  Neurological: She is alert and oriented to person, place, and time.       Assessment:     78 year old female status post left mastectomy with drain placement with appropriate wound healing and JP drainage.    Plan:     1. Patient's continue with left JP drain and continuous recording drainage output. 2. Patient follow up with Dr. Joellen Jersey as scheduled in one week.

## 2011-11-10 ENCOUNTER — Other Ambulatory Visit (HOSPITAL_BASED_OUTPATIENT_CLINIC_OR_DEPARTMENT_OTHER): Payer: Medicare Other | Admitting: Lab

## 2011-11-10 ENCOUNTER — Encounter: Payer: Self-pay | Admitting: Oncology

## 2011-11-10 ENCOUNTER — Ambulatory Visit (HOSPITAL_BASED_OUTPATIENT_CLINIC_OR_DEPARTMENT_OTHER): Payer: Medicare Other | Admitting: Oncology

## 2011-11-10 ENCOUNTER — Telehealth: Payer: Self-pay | Admitting: Oncology

## 2011-11-10 VITALS — BP 198/97 | HR 60 | Temp 97.6°F | Resp 20 | Ht 62.5 in | Wt 166.7 lb

## 2011-11-10 DIAGNOSIS — C50919 Malignant neoplasm of unspecified site of unspecified female breast: Secondary | ICD-10-CM

## 2011-11-10 DIAGNOSIS — Z87898 Personal history of other specified conditions: Secondary | ICD-10-CM

## 2011-11-10 DIAGNOSIS — Z171 Estrogen receptor negative status [ER-]: Secondary | ICD-10-CM

## 2011-11-10 DIAGNOSIS — D0510 Intraductal carcinoma in situ of unspecified breast: Secondary | ICD-10-CM

## 2011-11-10 DIAGNOSIS — C50912 Malignant neoplasm of unspecified site of left female breast: Secondary | ICD-10-CM

## 2011-11-10 LAB — COMPREHENSIVE METABOLIC PANEL (CC13)
ALT: 9 U/L (ref 0–55)
AST: 15 U/L (ref 5–34)
Albumin: 3.7 g/dL (ref 3.5–5.0)
Alkaline Phosphatase: 62 U/L (ref 40–150)
BUN: 10 mg/dL (ref 7.0–26.0)
CO2: 31 mEq/L — ABNORMAL HIGH (ref 22–29)
Calcium: 9.9 mg/dL (ref 8.4–10.4)
Chloride: 102 mEq/L (ref 98–107)
Creatinine: 0.9 mg/dL (ref 0.6–1.1)
Glucose: 90 mg/dl (ref 70–99)
Potassium: 3.5 mEq/L (ref 3.5–5.1)
Sodium: 141 mEq/L (ref 136–145)
Total Bilirubin: 0.6 mg/dL (ref 0.20–1.20)
Total Protein: 6.5 g/dL (ref 6.4–8.3)

## 2011-11-10 LAB — CBC WITH DIFFERENTIAL/PLATELET
BASO%: 1.1 % (ref 0.0–2.0)
Basophils Absolute: 0 10*3/uL (ref 0.0–0.1)
EOS%: 2.3 % (ref 0.0–7.0)
Eosinophils Absolute: 0.1 10*3/uL (ref 0.0–0.5)
HCT: 34.9 % (ref 34.8–46.6)
HGB: 12.1 g/dL (ref 11.6–15.9)
LYMPH%: 30.7 % (ref 14.0–49.7)
MCH: 31 pg (ref 25.1–34.0)
MCHC: 34.6 g/dL (ref 31.5–36.0)
MCV: 89.4 fL (ref 79.5–101.0)
MONO#: 0.5 10*3/uL (ref 0.1–0.9)
MONO%: 13.3 % (ref 0.0–14.0)
NEUT#: 2.1 10*3/uL (ref 1.5–6.5)
NEUT%: 52.6 % (ref 38.4–76.8)
Platelets: 241 10*3/uL (ref 145–400)
RBC: 3.9 10*6/uL (ref 3.70–5.45)
RDW: 13.6 % (ref 11.2–14.5)
WBC: 4 10*3/uL (ref 3.9–10.3)
lymph#: 1.2 10*3/uL (ref 0.9–3.3)

## 2011-11-10 LAB — LACTATE DEHYDROGENASE (CC13): LDH: 194 U/L (ref 125–220)

## 2011-11-10 NOTE — Progress Notes (Signed)
This office note has been dictated.  #[id number]

## 2011-11-10 NOTE — Telephone Encounter (Signed)
gv and printed appt for pt. °

## 2011-11-10 NOTE — Progress Notes (Signed)
CC:   Judy [name], M.D. [name], M.D. [name], M.D.   PROBLEM LIST:  1. Ductal carcinoma in situ, focal high-grade involving the left  breast, left upper quadrant, less than 0.5 cm. Estrogen and  progesterone receptor negative, diagnosed by needle biopsy on  11/23/2006 with needle localization biopsy on 12/25/2006. The  patient was lost to followup, although the plan was for her to have  radiation treatments at that time. When the patient reappeared about a year later, she was found to have recurrent DCIS in October 2009. She underwent several excisions with negative margins by 01/16/2008.  She underwent radiation treatments to the breast from 03/03/2008  through 03/27/2008.  2. Infiltrating carcinoma involving the left breast, associated with  Paget's disease. Punch biopsies of the nipple area were obtained by  Dr. Cicero Duck on 09/16/2011. There were infiltrating strands in  small clusters of atypical cells within the dermis. The overlying  epidermis was ulcerated or detached and replaced by an inflamed  crust. Estrogen and progesterone receptors are negative. HER-  2/neu by [facility] shows amplification with a ratio of 4.68. PET scan  carried out on 10/06/2011 shows no evidence for hypermetabolic  metastatic disease.  Ms. Huwe underwent a simple mastectomy with sentinel lymph node biopsy on 10/25/2011 by Dr. Cicero Duck.  She was found to have microscopic foci, less than 0.1 mm, arising from high-grade DCIS involving the nipple, grade 3/3 (Paget's disease.)  All 3 sentinel lymph nodes were free of tumor.  Resection margins were negative.  Extensive intraductal component was absent.  Tumor stage was felt to be best characterized as pTmi N0 M0.    3. Hypertension.  4. Dyslipidemia.  5. Diverticulosis of the sigmoid colon.  6. Gastroesophageal reflux disease.  7. Mild leukopenia.    MEDICATIONS: 1. Cimetidine 300 mg as needed. 2. Hydrochlorothiazide 25 mg  daily. 3. Lisinopril 40 mg daily. 4. Toprol-XL 100 mg daily. 5. Multivitamins 1 daily. 6. Potassium chloride 20 mEq total by mouth twice daily. 7. Zocor 20 mg every evening.  SMOKING HISTORY:  The patient has never smoked cigarettes.  HISTORY:  I saw Judy [name] today for followup of her recurrent cancer involving the left breast as described above.  The patient originally had DCIS, subsequently has microscopic invasive carcinoma involving the nipple.  Estrogen and progesterone receptors have been negative in the past.  HER-2/neu on the punch biopsies carried out on 09/16/2011 were positive with a ratio of 4.68.  As noted above, the patient underwent a simple mastectomy of the left breast with sentinel lymph node sampling on 10/25/2011.  The patient still has her Jackson-Pratt drain in.  This is draining.  She otherwise is doing fairly well.  She denies any fever. She is without any complaints today.  PHYSICAL EXAMINATION:  The patient looks well.  She is 78 years old. Weight is 166.7 pounds.  The patient says she does not have much of an appetite and has lost a little bit of weight.  Height 5 feet 2-1/2 inches, body surface area 1.83 sq. m.  Blood pressure today 198/97.  The patient was made aware of her elevated blood pressure.  There was no scleral icterus.  Mouth and pharynx benign.  No peripheral adenopathy palpable.  No axillary adenopathy.  Heart and lungs:  Normal.  Right breast is normal.  Left breast is surgically absent.  There is a recent oblique incision that seems to be healing well.  There is some surrounding ecchymosis.  The patient has a Jackson-Pratt drain in place, and it is draining serosanguineous fluid.  Abdomen:  Benign with no organomegaly or masses palpable.  No peripheral edema in the legs and no obvious lymphedema of the left arm.  LABORATORY DATA:  Today, white count 4.0, ANC 2.1, hemoglobin 12.1, hematocrit 34.9, platelets 241,000.  Chemistries were  normal.  IMAGING STUDIES:  1. Digital diagnostic bilateral mammogram from 11/29/2010 showed no  evidence of malignancy in either breast. Chest x-ray, 2 view, from  07/07/2011: Showed stable chronic and postoperative changes.  2. Digital diagnostic left mammogram on 09/12/2011: Showed findings  highly suspicious for DCIS in the sub areola left breast and left  nipple. There were new suspicious linearly oriented calcifications  in the sub areola, central and outer left breast that spanned  approximately 3.2 cm in maximum diameter. Calcifications extended  to the nipple which is now retracted. This is a new finding. The  nipple also looked thickened in appearance. There was no mass  associated with the left breast. Skin punch biopsy of the left  nipple was suggested.  3. PET scan from 10/06/2011 showed some mild hypermetabolism about the  left breast skin, but there was no evidence for hypermetabolic  metastatic disease. There was noted to be some hypermetabolism in  the right pelvic region felt to be most likely physiologic. There  was an incidental left-sided renal calculus and prominent  collateral veins in the right lower extremity.   IMPRESSION AND PLAN:  The patient seems to be doing well.  She has an appointment to see Dr. Jamey Ripa tomorrow.  I discussed the patient's case with Dr. Pierce Crane.  It was his feeling that the patient did not require any adjuvant systemic therapy.  She is felt to be at low risk for the development of local and/or distant metastatic disease.  I would like to see the patient again in about 1 month.  Thereafter, we may see her at 3 to 9-month intervals.  ADDENDUM:  The patient was given a prescription for prosthesis and bra x2 with as-needed refills.    ______________________________ Samul Dada, M.D. DSM/MEDQ  D:  11/10/2011  T:  11/10/2011  Job:  [id number]

## 2011-11-11 ENCOUNTER — Ambulatory Visit (INDEPENDENT_AMBULATORY_CARE_PROVIDER_SITE_OTHER): Payer: Medicare Other | Admitting: Surgery

## 2011-11-11 ENCOUNTER — Encounter (INDEPENDENT_AMBULATORY_CARE_PROVIDER_SITE_OTHER): Payer: Self-pay | Admitting: Surgery

## 2011-11-11 VITALS — BP 147/82 | HR 68 | Temp 98.2°F | Resp 12 | Ht 62.5 in | Wt 165.8 lb

## 2011-11-11 DIAGNOSIS — C50919 Malignant neoplasm of unspecified site of unspecified female breast: Secondary | ICD-10-CM

## 2011-11-11 DIAGNOSIS — C50912 Malignant neoplasm of unspecified site of left female breast: Secondary | ICD-10-CM

## 2011-11-11 NOTE — Patient Instructions (Signed)
See me again in a week. Go to the ABC class;     Judy [name]  is OK to attend the ABC Class Judy Paris, MD, [facility], [place] [phone] 11/11/2011 10:22 AM

## 2011-11-11 NOTE — Progress Notes (Signed)
Judy [name]    [id number] 11/11/2011    06-07-1930   CC: Post op TM &SLN, left  HPI: The patient returns for post op follow-up. She underwent a left mastectomy with SLN  on 10/25/11. Over all she feels that she is doing well. No problems. Wants drain out  PE: VITAL SIGNS: BP 147/82  Pulse 68  Temp 98.2 F (36.8 C) (Temporal)  Resp 12  Ht 5' 2.5" (1.588 m)  Wt 165 lb 12.8 oz (75.206 kg)  BMI 29.84 kg/m2  The incision is healing nicely and there is no evidence of infection or hematoma.  The drain is ready to remove, <20cc/day last few days.  DATA REVIEWED: Pathology report showed Satge I IDC  IMPRESSION: Patient doing well. Drain can come out  PLAN: Her next visit will be in one week. Told her to go to ABC class OK to get a bra OK to shower. She already has path report and reviewed with Dr Arline Asp .

## 2011-11-17 ENCOUNTER — Encounter: Payer: Self-pay | Admitting: Dietician

## 2011-11-17 NOTE — Progress Notes (Signed)
Brief Out-patient Oncology Nutrition Note  Reason: Patient Screened Positive For Nutrition Risk For Unintentional Weight Loss and Decreased Appetite.   Judy [name] is a 78 year old female patient of Dr. Arline Asp, diagnosed with breast cancer. Contacted patient via telephone for positive nutrition risk. She reported her appetite has gotten much better. She stated she has been eating three regular meals daily and 1 to 2 Ensure nutrition supplements.   Wt Readings from Last 10 Encounters:  11/11/11 165 lb 12.8 oz (75.206 kg)  11/10/11 166 lb 11.2 oz (75.615 kg)  11/02/11 169 lb (76.658 kg)  10/20/11 163 lb (73.936 kg)  10/20/11 163 lb (73.936 kg)  10/21/11 171 lb (77.565 kg)  10/06/11 169 lb 12.8 oz (77.021 kg)  09/21/11 174 lb (78.926 kg)  09/16/11 175 lb 9.6 oz (79.652 kg)  09/12/11 173 lb 4.8 oz (78.608 kg)    I have encouraged her to continue to eat regular meals daily and drink the Ensure nutrition supplements. The patient was without any further nutrition related questions. Provided RD contact information and instructed patient to contact RD for future questions.   RD available for nutrition needs.   Iven Finn, MS, RD, LDN [phone]

## 2011-11-18 ENCOUNTER — Ambulatory Visit (INDEPENDENT_AMBULATORY_CARE_PROVIDER_SITE_OTHER): Payer: Medicare Other | Admitting: Surgery

## 2011-11-18 ENCOUNTER — Encounter (INDEPENDENT_AMBULATORY_CARE_PROVIDER_SITE_OTHER): Payer: Self-pay | Admitting: Surgery

## 2011-11-18 VITALS — BP 146/88 | HR 80 | Temp 97.9°F | Resp 20 | Ht 62.5 in | Wt 170.0 lb

## 2011-11-18 DIAGNOSIS — Z9889 Other specified postprocedural states: Secondary | ICD-10-CM

## 2011-11-18 NOTE — Patient Instructions (Addendum)
See [name] again in one week as you will likely have some more fluid that needs to be drained

## 2011-11-18 NOTE — Progress Notes (Signed)
BRITTIANY MESSMAN    [id number] 11/18/2011    April 30, 1930   CC: Post op TM &SLN, left  HPI: The patient returns for post op follow-up. She underwent a left mastectomy with SLN  on 10/25/11. Over all she feels that she is doing well. No problems. Wants drain out  PE: VITAL SIGNS: BP 146/88  Pulse 80  Temp 97.9 F (36.6 C) (Temporal)  Resp 20  Ht 5' 2.5" (1.588 m)  Wt 170 lb (77.111 kg)  BMI 30.60 kg/m2  The incision is healing nicely and there is no evidence of infection or hematoma.There is a seroma present    DATA REVIEWED: Pathology report showed Satge I IDC  IMPRESSION: Patient doing well. Seroma  PLAN: Her next visit will be in one week. Aspirated 70 cc. Marland Kitchen

## 2011-11-24 ENCOUNTER — Telehealth (INDEPENDENT_AMBULATORY_CARE_PROVIDER_SITE_OTHER): Payer: Self-pay | Admitting: General Surgery

## 2011-11-24 ENCOUNTER — Encounter (INDEPENDENT_AMBULATORY_CARE_PROVIDER_SITE_OTHER): Payer: Medicare Other | Admitting: General Surgery

## 2011-11-24 NOTE — Telephone Encounter (Signed)
Patient missed appt with Dr Derrell Lolling today. Appt made with Dr Abbey Chatters for tomorrow. Patient made aware.

## 2011-11-24 NOTE — Telephone Encounter (Signed)
Message copied by Liliana Cline on Thu Nov 24, 2011  3:49 PM ------      Message from: Rise Paganini      Created: Thu Nov 24, 2011  3:24 PM      RegardingTomasita [name]: [phone]       Patient stated that she was unsure when her appointment was because she misplaced her paperwork. She was to see Dr. Derrell Lolling and I wasn't sure how to handle this. Please call to discuss. Thank you.

## 2011-11-25 ENCOUNTER — Encounter (INDEPENDENT_AMBULATORY_CARE_PROVIDER_SITE_OTHER): Payer: Self-pay | Admitting: General Surgery

## 2011-11-25 ENCOUNTER — Ambulatory Visit (INDEPENDENT_AMBULATORY_CARE_PROVIDER_SITE_OTHER): Payer: Medicare Other | Admitting: General Surgery

## 2011-11-25 VITALS — BP 142/88 | HR 64 | Temp 98.4°F | Resp 16 | Ht 62.5 in | Wt 166.0 lb

## 2011-11-25 DIAGNOSIS — Z9889 Other specified postprocedural states: Secondary | ICD-10-CM

## 2011-11-25 NOTE — Patient Instructions (Signed)
Call if you have significant swelling.

## 2011-11-25 NOTE — Progress Notes (Signed)
Subjective:     Patient ID: Judy [name], female   DOB: [birth year], 78 y.o.   MRN: [id number]  HPI  She is status post left mastectomy one month ago. She had a seroma that had to be aspirated and she is here for a wound check. She is not having any discomfort and does not notice any significant swelling.   Review of Systems     Objective:   Physical Exam Left chest-there is a well-healed incision with no significant swelling present. No nodules present.    Assessment:     Left breast cancer status post left mastectomy-no significant recurrent seroma.    Plan:     Return to see Dr. Jamey Ripa in 3 months.

## 2011-12-09 ENCOUNTER — Encounter: Payer: Self-pay | Admitting: Family

## 2011-12-09 ENCOUNTER — Other Ambulatory Visit (HOSPITAL_BASED_OUTPATIENT_CLINIC_OR_DEPARTMENT_OTHER): Payer: Medicare Other | Admitting: Lab

## 2011-12-09 ENCOUNTER — Telehealth: Payer: Self-pay | Admitting: Oncology

## 2011-12-09 ENCOUNTER — Ambulatory Visit (HOSPITAL_BASED_OUTPATIENT_CLINIC_OR_DEPARTMENT_OTHER): Payer: Medicare Other | Admitting: Family

## 2011-12-09 VITALS — BP 183/92 | HR 60 | Temp 97.0°F | Resp 20 | Ht 62.5 in | Wt 166.8 lb

## 2011-12-09 DIAGNOSIS — C50919 Malignant neoplasm of unspecified site of unspecified female breast: Secondary | ICD-10-CM

## 2011-12-09 DIAGNOSIS — E876 Hypokalemia: Secondary | ICD-10-CM

## 2011-12-09 DIAGNOSIS — D0510 Intraductal carcinoma in situ of unspecified breast: Secondary | ICD-10-CM

## 2011-12-09 DIAGNOSIS — Z171 Estrogen receptor negative status [ER-]: Secondary | ICD-10-CM

## 2011-12-09 DIAGNOSIS — D0590 Unspecified type of carcinoma in situ of unspecified breast: Secondary | ICD-10-CM

## 2011-12-09 DIAGNOSIS — C50912 Malignant neoplasm of unspecified site of left female breast: Secondary | ICD-10-CM

## 2011-12-09 LAB — CBC WITH DIFFERENTIAL/PLATELET
BASO%: 1.1 % (ref 0.0–2.0)
Basophils Absolute: 0 10*3/uL (ref 0.0–0.1)
EOS%: 2.6 % (ref 0.0–7.0)
Eosinophils Absolute: 0.1 10*3/uL (ref 0.0–0.5)
HCT: 36.2 % (ref 34.8–46.6)
HGB: 12.5 g/dL (ref 11.6–15.9)
LYMPH%: 36.1 % (ref 14.0–49.7)
MCH: 30.9 pg (ref 25.1–34.0)
MCHC: 34.5 g/dL (ref 31.5–36.0)
MCV: 89.5 fL (ref 79.5–101.0)
MONO#: 0.5 10*3/uL (ref 0.1–0.9)
MONO%: 11.5 % (ref 0.0–14.0)
NEUT#: 2.2 10*3/uL (ref 1.5–6.5)
NEUT%: 48.7 % (ref 38.4–76.8)
Platelets: 223 10*3/uL (ref 145–400)
RBC: 4.04 10*6/uL (ref 3.70–5.45)
RDW: 14 % (ref 11.2–14.5)
WBC: 4.6 10*3/uL (ref 3.9–10.3)
lymph#: 1.7 10*3/uL (ref 0.9–3.3)

## 2011-12-09 LAB — COMPREHENSIVE METABOLIC PANEL (CC13)
ALT: 13 U/L (ref 0–55)
AST: 18 U/L (ref 5–34)
Albumin: 4 g/dL (ref 3.5–5.0)
Alkaline Phosphatase: 71 U/L (ref 40–150)
BUN: 8 mg/dL (ref 7.0–26.0)
CO2: 31 mEq/L — ABNORMAL HIGH (ref 22–29)
Calcium: 10 mg/dL (ref 8.4–10.4)
Chloride: 103 mEq/L (ref 98–107)
Creatinine: 0.9 mg/dL (ref 0.6–1.1)
Glucose: 105 mg/dl — ABNORMAL HIGH (ref 70–99)
Potassium: 3.4 mEq/L — ABNORMAL LOW (ref 3.5–5.1)
Sodium: 142 mEq/L (ref 136–145)
Total Bilirubin: 0.4 mg/dL (ref 0.20–1.20)
Total Protein: 6.8 g/dL (ref 6.4–8.3)

## 2011-12-09 LAB — LACTATE DEHYDROGENASE (CC13): LDH: 224 U/L — ABNORMAL HIGH (ref 125–220)

## 2011-12-09 NOTE — Progress Notes (Signed)
Patient ID: Judy [name], female   DOB: 1930-05-30, 78 y.o.   MRN: [id number] CSN: [id number]   Cc: Judy [name], M.D.  [name], M.D.  [name], M.D.   Problem List: Judy [name] is a 78 y.o. African-American female with a problem list consisting of:  1. Ductal carcinoma in situ, focal high-grade involving the left breast, left upper quadrant, less than 0.5 cm. Estrogen and progesterone receptor negative, diagnosed by needle biopsy on 11/23/2006 with needle localization biopsy on 12/25/2006. The patient was lost to follow up, although the plan was for her to have radiation treatments at that time. When the patient reappeared about a year later, she was found to have recurrent DCIS in October 2009. She underwent several excisions with negative margins by 01/16/2008. She underwent radiation treatments to the breast from 03/03/2008 through 03/27/2008.  2. Infiltrating carcinoma involving the left breast, associated with Paget's disease. Punch biopsies of the nipple area were obtained by Dr. Cicero Duck on 09/16/2011. There were infiltrating strands in small clusters of atypical cells within the dermis. The overlying epidermis was ulcerated or detached and replaced by an inflamed crust. Estrogen and progesterone receptors are negative. HER-2/neu by CISH shows amplification with a ratio of 4.68. PET scan carried out on 10/06/2011 shows no evidence for hypermetabolic metastatic disease. [name] underwent a simple mastectomy with sentinel lymph node biopsy on 10/25/2011 by Dr. Cicero Duck. She was found to have microscopic foci, less than 0.1 mm, arising from high-grade DCIS involving the nipple, grade 3/3 (Paget's disease.) All 3 sentinel lymph nodes were free of tumor. Resection margins were negative. Extensive intraductal component was absent. Tumor stage was felt to be best characterized as pTmi N0 M0.  Left breast seroma aspirated by Dr. Jamey Ripa on 11/18/2011 3. Hypertension  4.  Dyslipidemia 5. Diverticulosis of the sigmoid colon  6. Gastroesophageal reflux disease  7. Mild leukopenia 8. Diverticulosis in sigmoid colon 9.  Internal hemorrhoids 10.Hypokalemia  Dr. Arline Asp and I saw Judy [name] today for follow up of her recurrent cancer involving the left breast as described above. The patient originally had DCIS, subsequently has microscopic invasive carcinoma involving the nipple. Estrogen and progesterone receptors have been negative in the past. HER-2/neu on the punch biopsies carried out on 09/16/2011 were  positive with a ratio of 4.68. As noted above, the patient underwent a simple mastectomy of the left breast with sentinel lymph node sampling on 10/25/2011. The patient's Jackson-Pratt drain has been removed and she also had a seroma aspirated by Dr. Jamey Ripa on 11/18/2011.  She otherwise is doing fairly well.  She states that she had lost her appetite for a while, but her appetite is improving.  The patient states that she has been taking only one tablet of KCL instead of the two tablets of KCL daily as prescribed.  Her current hypokalemia was explained to her.  The patient voiced understanding. She denies any other symptomatology today including fever, chest pain,  pain, night sweats, N/V/D or constipation, no unusual bleeding and no changes in bowel/urinary habits.     Past Medical History: Past Medical History  Diagnosis Date  . Cancer of breast, intraductal   . GERD (gastroesophageal reflux disease)   . HTN (hypertension)   . Dyslipidemia   . Blood transfusion   . Cancer   . Breast cancer, left 09/26/2011    Punch biopsies of the left nipple done on 09/16/2011.  ER and PR were negative.  HER2 is positive  with ratio 4.68.Had mastectomy on 10/25/11. Path T1,N0.     Surgical History: Past Surgical History  Procedure Date  . Rotator cuff repair     Bilateral, 2010  . Total abdominal hysterectomy   . Breast surgery 2009    lt lumpectomy-plus 2 re-excisions  for margins  . Back surgery 2009    lumb lam  . Left breast mastectomy 2013    Current Medications: Current Outpatient Prescriptions  Medication Sig Dispense Refill  . cimetidine (TAGAMET) 300 MG tablet Take 300 mg by mouth as needed.       . hydrochlorothiazide (HYDRODIURIL) 25 MG tablet Take 25 mg by mouth daily.      Marland Kitchen lisinopril (PRINIVIL,ZESTRIL) 40 MG tablet Take 40 mg by mouth daily.      . metoprolol succinate (TOPROL-XL) 100 MG 24 hr tablet Take 100 mg by mouth daily. Take with or immediately following a meal.      . Multiple Vitamin (MULTIVITAMIN) capsule Take 1 capsule by mouth daily.      . potassium chloride (K-DUR) 10 MEQ tablet Take 2 tablets (20 mEq total) by mouth 2 (two) times daily.  10 tablet  0  . simvastatin (ZOCOR) 20 MG tablet Take 20 mg by mouth every evening.        Allergies: Allergies  Allergen Reactions  . Amoxicillin Nausea Only  . Codeine Nausea Only    Family History: Family History  Problem Relation Age of Onset  . Cancer Sister     breast  . Breast cancer Sister   . Cancer Sister     lung  . Diabetes Son   . Colon cancer Brother   . Esophageal cancer Neg Hx   . Rectal cancer Neg Hx   . Stomach cancer Neg Hx     Social History: History  Substance Use Topics  . Smoking status: Never Smoker   . Smokeless tobacco: Never Used  . Alcohol Use: No    Review of Systems: 10 Point review of systems was completed and is negative except as noted above.   Physical Exam:   Blood pressure 183/92, pulse 60, temperature 97 F (36.1 C), temperature source Oral, resp. rate 20, height 5' 2.5" (1.588 m), weight 166 lb 12.8 oz (75.66 kg).  General appearance: Alert, cooperative, well nourished, no apparent distress  Eyes: Conjunctivae clear, arcus senilis, PERRLA, EOMI  Throat: Lips, mucosa, tongue, teeth and gums are normal, no adenopathy  Lungs: CTA, no wheezes/rales/rhonchi  Breasts: No tenderness, s/p left breast mastectomy - healing surgical  scar without signs of infection,  no axillary adenopathy, no UE lymphedema  Heart: Regular rate and rhythm, S1, S2 normal, no murmur/click/rub/gallop  Abdomen: Soft, distended, hypoactive bowel sounds, no tenderness, no organomegaly Extremities: Extremities normal, atraumatic, no cyanosis or edema  Lymph nodes: Cervical, supraclavicular, and axillary nodes normal Neurologic: Grossly normal   Laboratory Data: Results for orders placed in visit on 12/09/11 (from the past 48 hour(s))  CBC WITH DIFFERENTIAL     Status: Normal   Collection Time   12/09/11  2:09 PM      Component Value Range Comment   WBC 4.6  3.9 - 10.3 10e3/uL    NEUT# 2.2  1.5 - 6.5 10e3/uL    HGB 12.5  11.6 - 15.9 g/dL    HCT 16.1  09.6 - 04.5 %    Platelets 223  145 - 400 10e3/uL    MCV 89.5  79.5 - 101.0 fL    MCH 30.9  25.1 - 34.0 pg    MCHC 34.5  31.5 - 36.0 g/dL    RBC 1.61  0.96 - 0.45 10e6/uL    RDW 14.0  11.2 - 14.5 %    lymph# 1.7  0.9 - 3.3 10e3/uL    MONO# 0.5  0.1 - 0.9 10e3/uL    Eosinophils Absolute 0.1  0.0 - 0.5 10e3/uL    Basophils Absolute 0.0  0.0 - 0.1 10e3/uL    NEUT% 48.7  38.4 - 76.8 %    LYMPH% 36.1  14.0 - 49.7 %    MONO% 11.5  0.0 - 14.0 %    EOS% 2.6  0.0 - 7.0 %    BASO% 1.1  0.0 - 2.0 %   COMPREHENSIVE METABOLIC PANEL (CC13)     Status: Abnormal   Collection Time   12/09/11  2:09 PM      Component Value Range Comment   Sodium 142  136 - 145 mEq/L    Potassium 3.4 (*) 3.5 - 5.1 mEq/L    Chloride 103  98 - 107 mEq/L    CO2 31 (*) 22 - 29 mEq/L    Glucose 105 (*) 70 - 99 mg/dl    BUN 8.0  7.0 - 40.9 mg/dL    Creatinine 0.9  0.6 - 1.1 mg/dL    Total Bilirubin 8.11  0.20 - 1.20 mg/dL    Alkaline Phosphatase 71  40 - 150 U/L    AST 18  5 - 34 U/L    ALT 13  0 - 55 U/L    Total Protein 6.8  6.4 - 8.3 g/dL    Albumin 4.0  3.5 - 5.0 g/dL    Calcium 91.4  8.4 - 10.4 mg/dL   LACTATE DEHYDROGENASE (CC13)     Status: Abnormal   Collection Time   12/09/11  2:09 PM      Component  Value Range Comment   LDH 224 (*) 125 - 220 U/L      Imaging Studies: 1. Digital diagnostic bilateral mammogram from 11/29/2010 showed no evidence of malignancy in either breast. Chest x-ray, 2 view, from 07/07/2011: Showed stable chronic and postoperative changes.  2. Digital diagnostic left mammogram on 09/12/2011: Showed findings highly suspicious for DCIS in the sub areola left breast and left nipple. There were new suspicious linearly oriented calcifications in the sub areola, central and outer left breast that spanned  approximately 3.2 cm in maximum diameter. Calcifications extended to the nipple which is now retracted. This is a new finding. The nipple also looked thickened in appearance. There was no mass associated with the left breast. Skin punch biopsy of the left  nipple was suggested.  3. Colonoscopy on 09/21/2011 showed moderate diverticulosis in the sigmoid colon, melanosis in the right colon and internal hemorrhoids.  No further colonoscopies are recommended. 4. PET scan from 10/06/2011 showed some mild hypermetabolism about the left breast skin, but there was no evidence for hypermetabolic metastatic disease. There was noted to be some hypermetabolism in the right pelvic region felt to be most likely physiologic. There was an incidental left-sided renal calculus and prominent collateral veins in the right lower extremity.    Impression/Plan: The patient seems to be doing well.  Dr. Arline Asp discussed the patient's case with Dr. Pierce Crane. It was his feeling that the patient did not require any adjuvant systemic therapy. She is felt to be at low risk for the development of local and/or distant metastatic disease.  The patient  has been given a prescription for prosthesis and bra x2 with as-needed refills.  We will see Mrs. Amero again on or about March 08, 2012 at which time will check CBC, CMP and LDH.  Slight LDH elevation from today's laboratories was noted by Dr. Arline Asp.  The  patient was instructed to take 20 mEq PO of KCL for the next 2 days and to consume 1 banana daily for 1 week for her slight hypokalemia.  We also requested her PCP to recheck her potassium level soon. The patient will contact us in the interim if she has any questions or concerns.   Larina Bras, NP-C 12/09/2011, 3:31 PM

## 2011-12-09 NOTE — Patient Instructions (Addendum)
Please take 2 potassium pills (20 mEq total) today and tomorrow 12/10/11.   Have potassium rechecked by Dr. Clelia Croft next week. Please monitor your blood pressure closely and notify Dr. Clelia Croft of your elevated blood pressure today  183/92

## 2011-12-09 NOTE — Telephone Encounter (Signed)
appts made and printed for pt aom °

## 2011-12-12 ENCOUNTER — Telehealth: Payer: Self-pay | Admitting: Family

## 2011-12-12 NOTE — Telephone Encounter (Signed)
Notified Linda at Dr. Lamar Benes office about Ms. Fomby' hypokalemia (3.4) and asked if they could follow up on this laboratory result.  She agreed to do so.

## 2012-02-24 ENCOUNTER — Ambulatory Visit (INDEPENDENT_AMBULATORY_CARE_PROVIDER_SITE_OTHER): Payer: Medicare Other | Admitting: Surgery

## 2012-02-24 ENCOUNTER — Encounter (INDEPENDENT_AMBULATORY_CARE_PROVIDER_SITE_OTHER): Payer: Self-pay | Admitting: Surgery

## 2012-02-24 VITALS — BP 158/80 | HR 60 | Resp 18 | Ht 62.5 in | Wt 163.0 lb

## 2012-02-24 DIAGNOSIS — Z853 Personal history of malignant neoplasm of breast: Secondary | ICD-10-CM

## 2012-02-24 NOTE — Progress Notes (Signed)
NAME: Judy [name]       DOB: 1930-06-19           DATE: 02/24/2012       MRN: [id number]   DARRYL BLUMENSTEIN is a 79 y.o.Marland Kitchenfemale who presents for routine followup of her Left breast cancer, Stage I (Microscopic invasion),  diagnosed in August 2013 and treated with mastectomy. Previously she had DCIS treated with lumpectomy, then a recurrence and several attempts to get negative margins. She has no problems or concerns on either side, except she notes a minor skin tag at the lateral edge of the incision that is sometimes irritated by her bra  PFSH: She has had no significant changes since the last visit here.  ROS: There have been no significant changes since the last visit here  EXAM:  VS: BP 158/80  Pulse 60  Resp 18  Ht 5' 2.5" (1.588 m)  Wt 163 lb (73.936 kg)  BMI 29.34 kg/m2  General: The patient is alert, oriented, generally healthy appearing, NAD. Mood and affect are normal.  Breasts:  Right is normal, left s/p mastectomy. I think there is a chronic seroma in the lateral half of the lower flap. No infection  Lymphatics: She has no axillary or supraclavicular adenopathy on either side.  Extremities: Full ROM of the surgical side with no lymphedema noted.  Data Reviewed: No new data  Impression: Doing well, with no evidence of recurrent cancer or new cancer  Plan: RTC 6 months.We can consider excision of the skin tag if it is still bothering her

## 2012-02-24 NOTE — Patient Instructions (Signed)
See me again in about 6 months

## 2012-03-08 ENCOUNTER — Telehealth: Payer: Self-pay | Admitting: Oncology

## 2012-03-08 ENCOUNTER — Ambulatory Visit (HOSPITAL_BASED_OUTPATIENT_CLINIC_OR_DEPARTMENT_OTHER): Payer: Medicare Other | Admitting: Family

## 2012-03-08 ENCOUNTER — Ambulatory Visit: Payer: Medicare Other | Admitting: Oncology

## 2012-03-08 ENCOUNTER — Other Ambulatory Visit (HOSPITAL_BASED_OUTPATIENT_CLINIC_OR_DEPARTMENT_OTHER): Payer: Medicare Other | Admitting: Lab

## 2012-03-08 ENCOUNTER — Encounter: Payer: Self-pay | Admitting: Family

## 2012-03-08 VITALS — BP 196/91 | HR 59 | Temp 97.2°F | Resp 20 | Ht 62.5 in | Wt 164.4 lb

## 2012-03-08 DIAGNOSIS — D0510 Intraductal carcinoma in situ of unspecified breast: Secondary | ICD-10-CM

## 2012-03-08 DIAGNOSIS — C50919 Malignant neoplasm of unspecified site of unspecified female breast: Secondary | ICD-10-CM

## 2012-03-08 DIAGNOSIS — D0590 Unspecified type of carcinoma in situ of unspecified breast: Secondary | ICD-10-CM

## 2012-03-08 DIAGNOSIS — C50912 Malignant neoplasm of unspecified site of left female breast: Secondary | ICD-10-CM

## 2012-03-08 DIAGNOSIS — E876 Hypokalemia: Secondary | ICD-10-CM

## 2012-03-08 LAB — CBC WITH DIFFERENTIAL/PLATELET
BASO%: 1.2 % (ref 0.0–2.0)
Basophils Absolute: 0 10*3/uL (ref 0.0–0.1)
EOS%: 4 % (ref 0.0–7.0)
Eosinophils Absolute: 0.2 10*3/uL (ref 0.0–0.5)
HCT: 35.6 % (ref 34.8–46.6)
HGB: 12.1 g/dL (ref 11.6–15.9)
LYMPH%: 29.8 % (ref 14.0–49.7)
MCH: 29.7 pg (ref 25.1–34.0)
MCHC: 33.9 g/dL (ref 31.5–36.0)
MCV: 87.5 fL (ref 79.5–101.0)
MONO#: 0.5 10*3/uL (ref 0.1–0.9)
MONO%: 12.6 % (ref 0.0–14.0)
NEUT#: 2.2 10*3/uL (ref 1.5–6.5)
NEUT%: 52.4 % (ref 38.4–76.8)
Platelets: 210 10*3/uL (ref 145–400)
RBC: 4.07 10*6/uL (ref 3.70–5.45)
RDW: 14.4 % (ref 11.2–14.5)
WBC: 4.2 10*3/uL (ref 3.9–10.3)
lymph#: 1.3 10*3/uL (ref 0.9–3.3)

## 2012-03-08 LAB — LACTATE DEHYDROGENASE (CC13): LDH: 235 U/L (ref 125–245)

## 2012-03-08 LAB — COMPREHENSIVE METABOLIC PANEL (CC13)
ALT: 13 U/L (ref 0–55)
AST: 19 U/L (ref 5–34)
Albumin: 3.6 g/dL (ref 3.5–5.0)
Alkaline Phosphatase: 68 U/L (ref 40–150)
BUN: 10 mg/dL (ref 7.0–26.0)
CO2: 32 mEq/L — ABNORMAL HIGH (ref 22–29)
Calcium: 9.7 mg/dL (ref 8.4–10.4)
Chloride: 102 mEq/L (ref 98–107)
Creatinine: 0.8 mg/dL (ref 0.6–1.1)
Glucose: 82 mg/dl (ref 70–99)
Potassium: 3.3 mEq/L — ABNORMAL LOW (ref 3.5–5.1)
Sodium: 142 mEq/L (ref 136–145)
Total Bilirubin: 0.54 mg/dL (ref 0.20–1.20)
Total Protein: 7.1 g/dL (ref 6.4–8.3)

## 2012-03-08 MED ORDER — POTASSIUM CHLORIDE CRYS ER 20 MEQ PO TBCR
20.0000 meq | EXTENDED_RELEASE_TABLET | Freq: Once | ORAL | Status: AC
  Administered 2012-03-08: 20 meq via ORAL
  Filled 2012-03-08: qty 1

## 2012-03-08 NOTE — Patient Instructions (Addendum)
Please contact us at [phone] if you have any questions or concerns.  Remember to take your potassium pills. Report your high blood pressure to your primary care physician.

## 2012-03-08 NOTE — Telephone Encounter (Signed)
Pt will have mammogram on 03/16/12 [facility].Gave pt appt for lab and MD on April 2014

## 2012-03-08 NOTE — Progress Notes (Signed)
Patient ID: Judy [name], female   DOB: [birth year], 79 y.o.   MRN: [id number] CSN: [id number]  Cc: Judy [name], M.D.  Judy [name], M.D.  Judy [name], M.D.    Problem List: Judy [name] is a 79 y.o. African- American female with a problem list consisting of:  1. Ductal carcinoma in situ, focal high-grade involving the left breast, left upper quadrant, less than 0.5 cm. Estrogen and progesterone receptor negative, diagnosed by needle biopsy on 11/23/2006 with needle localization biopsy on 12/25/2006. The patient was lost to follow up, although the plan was for her to have radiation treatments at that time. When the patient reappeared about a year later, she was found to have recurrent DCIS in October 2009. She underwent several excisions with negative margins by 01/16/2008. She underwent radiation treatments to the breast from 03/03/2008 through 03/27/2008.  2. Infiltrating carcinoma involving the left breast, associated with Paget's disease. Punch biopsies of the nipple area were obtained by Judy [name] on 09/16/2011. There were infiltrating strands in small clusters of atypical cells within the dermis. The overlying epidermis was ulcerated or detached and replaced by an inflamed crust. Estrogen and progesterone receptors are negative. HER-2/neu by CISH shows amplification with a ratio of 4.68. PET scan carried out on 10/06/2011 shows no evidence for hypermetabolic metastatic disease. Judy [name] underwent a simple mastectomy with sentinel lymph node biopsy on 10/25/2011 by Judy [name]. She was found to have microscopic foci, less than 0.1 mm, arising from high-grade DCIS involving the nipple, grade 3/3 (Paget's disease.) All 3 sentinel lymph nodes were free of tumor. Resection margins were negative. Extensive intraductal component was absent. Tumor stage was felt to be best characterized as pTmi N0 M0. Left breast seroma aspirated by Judy [name] on 11/18/2011  3. Hypertension  4.  Dyslipidemia  5. Diverticulosis of the sigmoid colon  6. Gastroesophageal reflux disease  7. Mild leukopenia  8. Diverticulosis in sigmoid colon  9. Internal hemorrhoids  10.Hypokalemia  Judy [name] and I saw Judy [name] today for follow up of her recurrent cancer involving the left breast as described above. The patient originally had DCIS, subsequently has microscopic invasive carcinoma involving the nipple. Estrogen and progesterone receptors have been negative in the past. HER-2/neu on the punch biopsies carried out on 09/16/2011 were positive with a ratio of 4.68. As noted above, the patient underwent a simple mastectomy of the left breast with sentinel lymph node sampling on 10/25/2011.   Since her last office visit on 12/09/2011 she reports that she has generally been doing well.  She stated that she had a sinus infection in December, but this has resolved.   Judy [name]' is hypokalemic again today with a potassium level of 3.3.  She was given 20 mEq of potassium chloride during today's office visit.  I spoke to Judy [name] at Judy [name] office and notified her of the patient's hypokalemia and elevated blood pressure at 196/91.  Judy [name] is not sure if she has been taking her potassium pills or not.  She states that HCTZ was discontinued last year by Judy [name].   Judy [name] has had a decrease in appetite, but states she has been drinking Ensure supplemental nutrition drinks.  She has had a 3 pound weight loss since her last office visit.  She denies any other symptomatology today including fever, chest pain, pain, night sweats, N/V/D or constipation, no unusual bleeding and no changes in bowel/urinary habits. Judy [name]  was seen by  Judy [name] on 11/18/2011.     Past Medical History: Past Medical History  Diagnosis Date  . Cancer of breast, intraductal   . GERD (gastroesophageal reflux disease)   . HTN (hypertension)   . Dyslipidemia   . Blood transfusion   .  Cancer   . Breast cancer, left 09/26/2011    Punch biopsies of the left nipple done on 09/16/2011.  ER and PR were negative.  HER2 is positive with ratio 4.68.Had mastectomy on 10/25/11. Path T1,N0.     Surgical History: Past Surgical History  Procedure Date  . Rotator cuff repair     Bilateral, 2010  . Total abdominal hysterectomy   . Breast surgery 2009    lt lumpectomy-plus 2 re-excisions for margins  . Back surgery 2009    lumb lam  . Left breast mastectomy 2013    Current Medications: Current Outpatient Prescriptions  Medication Sig Dispense Refill  . cimetidine (TAGAMET) 300 MG tablet Take 300 mg by mouth as needed.       Marland Kitchen lisinopril (PRINIVIL,ZESTRIL) 40 MG tablet Take 40 mg by mouth daily.      . metoprolol succinate (TOPROL-XL) 100 MG 24 hr tablet Take 100 mg by mouth daily. Take with or immediately following a meal.      . Multiple Vitamin (MULTIVITAMIN) capsule Take 1 capsule by mouth daily.      . potassium chloride (K-DUR) 10 MEQ tablet Take 2 tablets (20 mEq total) by mouth 2 (two) times daily.  10 tablet  0  . simvastatin (ZOCOR) 20 MG tablet Take 20 mg by mouth every evening.        Allergies: Allergies  Allergen Reactions  . Amoxicillin Nausea Only  . Codeine Nausea Only    Family History: Family History  Problem Relation Age of Onset  . Cancer Sister     breast  . Breast cancer Sister   . Cancer Sister     lung  . Diabetes Son   . Colon cancer Brother   . Esophageal cancer Neg Hx   . Rectal cancer Neg Hx   . Stomach cancer Neg Hx     Social History: History  Substance Use Topics  . Smoking status: Never Smoker   . Smokeless tobacco: Never Used  . Alcohol Use: No    Review of Systems: 10 Point review of systems was completed and is negative except as noted above.   Physical Exam:   Blood pressure 196/91, pulse 59, temperature 97.2 F (36.2 C), temperature source Oral, resp. rate 20, height 5' 2.5" (1.588 m), weight 164 lb 6.4 oz (74.571  kg).  General appearance: Alert, cooperative, well nourished, no apparent distress  Eyes: Conjunctivae clear, arcus senilis, PERRLA, EOMI  Throat: Lips, mucosa, tongue, teeth and gums are normal, no adenopathy  Lungs: CTA, no wheezes/rales/rhonchi  Breasts: No tenderness, s/p left breast mastectomy - surgical scar without signs of infection, no axillary adenopathy, no UE lymphedema, skin tag,  right breast is benign  Heart: Regular rate and rhythm, S1, S2 normal, no murmur/click/rub/gallop  Abdomen: Soft, distended, hypoactive bowel sounds, no tenderness, no organomegaly  Extremities: Extremities normal, atraumatic, no cyanosis or edema  Lymph nodes: Cervical, supraclavicular, and axillary nodes normal  Neurologic: Grossly normal    Laboratory Data: Results for orders placed in visit on 03/08/12 (from the past 48 hour(s))  COMPREHENSIVE METABOLIC PANEL (CC13)     Status: Abnormal   Collection Time   03/08/12  9:06 AM  Component Value Range Comment   Sodium 142  136 - 145 mEq/L    Potassium 3.3 (*) 3.5 - 5.1 mEq/L    Chloride 102  98 - 107 mEq/L    CO2 32 (*) 22 - 29 mEq/L    Glucose 82  70 - 99 mg/dl    BUN 09.8  7.0 - 11.9 mg/dL    Creatinine 0.8  0.6 - 1.1 mg/dL    Total Bilirubin 1.47  0.20 - 1.20 mg/dL    Alkaline Phosphatase 68  40 - 150 U/L    AST 19  5 - 34 U/L    ALT 13  0 - 55 U/L    Total Protein 7.1  6.4 - 8.3 g/dL    Albumin 3.6  3.5 - 5.0 g/dL    Calcium 9.7  8.4 - 82.9 mg/dL   LACTATE DEHYDROGENASE (CC13)     Status: Normal   Collection Time   03/08/12  9:06 AM      Component Value Range Comment   LDH 235  125 - 245 U/L   CBC WITH DIFFERENTIAL     Status: Normal   Collection Time   03/08/12  9:06 AM      Component Value Range Comment   WBC 4.2  3.9 - 10.3 10e3/uL    NEUT# 2.2  1.5 - 6.5 10e3/uL    HGB 12.1  11.6 - 15.9 g/dL    HCT 56.2  13.0 - 86.5 %    Platelets 210  145 - 400 10e3/uL    MCV 87.5  79.5 - 101.0 fL    MCH 29.7  25.1 - 34.0 pg    MCHC  33.9  31.5 - 36.0 g/dL    RBC 7.84  6.96 - 2.95 10e6/uL    RDW 14.4  11.2 - 14.5 %    lymph# 1.3  0.9 - 3.3 10e3/uL    MONO# 0.5  0.1 - 0.9 10e3/uL    Eosinophils Absolute 0.2  0.0 - 0.5 10e3/uL    Basophils Absolute 0.0  0.0 - 0.1 10e3/uL    NEUT% 52.4  38.4 - 76.8 %    LYMPH% 29.8  14.0 - 49.7 %    MONO% 12.6  0.0 - 14.0 %    EOS% 4.0  0.0 - 7.0 %    BASO% 1.2  0.0 - 2.0 %      Imaging Studies: 1. Digital diagnostic bilateral mammogram from 11/29/2010 showed no evidence of malignancy in either breast. Chest x-ray, 2 view, from 07/07/2011: Showed stable chronic and postoperative changes.  2. Digital diagnostic left mammogram on 09/12/2011: Showed findings highly suspicious for DCIS in the sub areola left breast and left nipple. There were new suspicious linearly oriented calcifications in the sub areola, central and outer left breast that spanned  approximately 3.2 cm in maximum diameter. Calcifications extended to the nipple which is now retracted. This is a new finding. The nipple also looked thickened in appearance. There was no mass associated with the left breast. Skin punch biopsy of the left  nipple was suggested.  3. Colonoscopy on 09/21/2011 showed moderate diverticulosis in the sigmoid colon, melanosis in the right colon and internal hemorrhoids. No further colonoscopies are recommended.  4. PET scan from 10/06/2011 showed some mild hypermetabolism about the left breast skin, but there was no evidence for hypermetabolic metastatic disease. There was noted to be some hypermetabolism in the right pelvic region felt to be most likely physiologic. There was an incidental  left-sided renal calculus and prominent collateral veins in the right lower extremity.    Impression/Plan: Judy [name] seems to be doing well.  Judy [name] had previously  discussed the patient's case with Dr. Pierce Crane. It was his feeling that the patient did not require any adjuvant systemic therapy. She is felt  to be at low risk for the development of local and/or distant metastatic disease. We plan to see Judy [name] again in 3 months on 06/07/2012 at which time will check CBC, CMP and LDH. A screening right unilateral mammogram has been scheduled on 03/16/2012 for the patient.   Judy [name] was encouraged to contact us in the interim if she has any questions or concerns.   Larina Bras, NP-C 03/08/2012, 10:49 AM

## 2012-03-16 ENCOUNTER — Ambulatory Visit
Admission: RE | Admit: 2012-03-16 | Discharge: 2012-03-16 | Disposition: A | Discharge status: Home / Self Care | Payer: Medicare Other | Source: Ambulatory Visit | Attending: Family | Admitting: Family

## 2012-03-16 DIAGNOSIS — D0510 Intraductal carcinoma in situ of unspecified breast: Secondary | ICD-10-CM

## 2012-03-16 DIAGNOSIS — E876 Hypokalemia: Secondary | ICD-10-CM

## 2012-03-16 DIAGNOSIS — C50912 Malignant neoplasm of unspecified site of left female breast: Secondary | ICD-10-CM

## 2012-05-09 ENCOUNTER — Telehealth: Payer: Self-pay | Admitting: Oncology

## 2012-05-25 ENCOUNTER — Encounter: Payer: Self-pay | Admitting: Gastroenterology

## 2012-06-07 ENCOUNTER — Ambulatory Visit (HOSPITAL_BASED_OUTPATIENT_CLINIC_OR_DEPARTMENT_OTHER): Payer: Medicare Other | Admitting: Oncology

## 2012-06-07 ENCOUNTER — Ambulatory Visit: Payer: Medicare Other | Admitting: Oncology

## 2012-06-07 ENCOUNTER — Telehealth: Payer: Self-pay

## 2012-06-07 ENCOUNTER — Encounter: Payer: Self-pay | Admitting: Oncology

## 2012-06-07 ENCOUNTER — Other Ambulatory Visit: Payer: Medicare Other | Admitting: Lab

## 2012-06-07 ENCOUNTER — Other Ambulatory Visit (HOSPITAL_BASED_OUTPATIENT_CLINIC_OR_DEPARTMENT_OTHER): Payer: Medicare Other | Admitting: Lab

## 2012-06-07 VITALS — BP 151/70 | HR 78 | Temp 96.8°F | Resp 18 | Ht 62.0 in | Wt 162.6 lb

## 2012-06-07 DIAGNOSIS — Z170 Estrogen receptor positive status [ER+]: Secondary | ICD-10-CM

## 2012-06-07 DIAGNOSIS — C50019 Malignant neoplasm of nipple and areola, unspecified female breast: Secondary | ICD-10-CM

## 2012-06-07 DIAGNOSIS — C50912 Malignant neoplasm of unspecified site of left female breast: Secondary | ICD-10-CM

## 2012-06-07 DIAGNOSIS — D0510 Intraductal carcinoma in situ of unspecified breast: Secondary | ICD-10-CM

## 2012-06-07 DIAGNOSIS — Z87898 Personal history of other specified conditions: Secondary | ICD-10-CM

## 2012-06-07 DIAGNOSIS — E876 Hypokalemia: Secondary | ICD-10-CM

## 2012-06-07 LAB — CBC WITH DIFFERENTIAL/PLATELET
BASO%: 0.6 % (ref 0.0–2.0)
Basophils Absolute: 0 10*3/uL (ref 0.0–0.1)
EOS%: 2.8 % (ref 0.0–7.0)
Eosinophils Absolute: 0.1 10*3/uL (ref 0.0–0.5)
HCT: 36.2 % (ref 34.8–46.6)
HGB: 12.5 g/dL (ref 11.6–15.9)
LYMPH%: 27.2 % (ref 14.0–49.7)
MCH: 29.5 pg (ref 25.1–34.0)
MCHC: 34.5 g/dL (ref 31.5–36.0)
MCV: 85.4 fL (ref 79.5–101.0)
MONO#: 0.5 10*3/uL (ref 0.1–0.9)
MONO%: 10.1 % (ref 0.0–14.0)
NEUT#: 3 10*3/uL (ref 1.5–6.5)
NEUT%: 59.3 % (ref 38.4–76.8)
Platelets: 244 10*3/uL (ref 145–400)
RBC: 4.24 10*6/uL (ref 3.70–5.45)
RDW: 14.3 % (ref 11.2–14.5)
WBC: 5 10*3/uL (ref 3.9–10.3)
lymph#: 1.4 10*3/uL (ref 0.9–3.3)
nRBC: 0 % (ref 0–0)

## 2012-06-07 LAB — COMPREHENSIVE METABOLIC PANEL (CC13)
ALT: 17 U/L (ref 0–55)
AST: 24 U/L (ref 5–34)
Albumin: 3.8 g/dL (ref 3.5–5.0)
Alkaline Phosphatase: 66 U/L (ref 40–150)
BUN: 18.2 mg/dL (ref 7.0–26.0)
CO2: 34 mEq/L — ABNORMAL HIGH (ref 22–29)
Calcium: 9.7 mg/dL (ref 8.4–10.4)
Chloride: 100 mEq/L (ref 98–107)
Creatinine: 1.1 mg/dL (ref 0.6–1.1)
Glucose: 114 mg/dl — ABNORMAL HIGH (ref 70–99)
Potassium: 2.9 mEq/L — CL (ref 3.5–5.1)
Sodium: 143 mEq/L (ref 136–145)
Total Bilirubin: 0.36 mg/dL (ref 0.20–1.20)
Total Protein: 7.4 g/dL (ref 6.4–8.3)

## 2012-06-07 LAB — LACTATE DEHYDROGENASE (CC13): LDH: 216 U/L (ref 125–245)

## 2012-06-07 NOTE — Telephone Encounter (Signed)
Faxed labs to Dr Clelia Croft per Dr Arline Asp. K+ is 2.9. Dr Arline Asp will instruct pt for tonight.

## 2012-06-07 NOTE — Progress Notes (Signed)
This office note has been dictated.  #[id number]

## 2012-06-08 ENCOUNTER — Telehealth: Payer: Self-pay | Admitting: Oncology

## 2012-06-08 NOTE — Progress Notes (Signed)
CC:   Judy [name], M.D. [name], M.D. Judy [name], M.D.  PROBLEM LIST:  1. Ductal carcinoma in situ, focal high-grade involving the left  breast, left upper quadrant, less than 0.5 cm. Estrogen and  progesterone receptor negative, diagnosed by needle biopsy on  11/23/2006 with needle localization biopsy on 12/25/2006. The  patient was lost to followup, although the plan was for her to have  radiation treatments at that time. When the patient reappeared about a year later, she was found to have recurrent DCIS in October 2009. She underwent several excisions with negative margins by 01/16/2008.  She underwent radiation treatments to the breast from 03/03/2008  through 03/27/2008.   2. Infiltrating carcinoma involving the left breast, associated with  Paget's disease. Punch biopsies of the nipple area were obtained by  Dr. Cicero Duck on 09/16/2011. There were infiltrating strands in  small clusters of atypical cells within the dermis. The overlying  epidermis was ulcerated or detached and replaced by an inflamed  crust. Estrogen and progesterone receptors are negative. HER-  2/neu by CISH shows amplification with a ratio of 4.68. PET scan  carried out on 10/06/2011 shows no evidence for hypermetabolic  metastatic disease. Ms. Nicastro underwent a simple mastectomy with sentinel lymph node biopsy on 10/25/2011 by Dr. Cicero Duck. She was found to have microscopic foci, less than 0.1 mm, arising from high-grade DCIS involving the nipple, grade 3/3 (Paget's disease.) All 3 sentinel lymph nodes were free of  tumor. Resection margins were negative. Extensive intraductal  component was absent. Tumor stage was felt to be best characterized as  pTmi N0 M0.   3. Hypertension.  4. Dyslipidemia.  5. Diverticulosis of the sigmoid colon.  6. Gastroesophageal reflux disease.  7. Mild leukopenia.    MEDICATIONS:  Reviewed and recorded. Current Outpatient Prescriptions   Medication Sig Dispense Refill  . amLODipine (NORVASC) 10 MG tablet Take 10 mg by mouth daily.      . hydrochlorothiazide (HYDRODIURIL) 25 MG tablet Take 25 mg by mouth daily.      Marland Kitchen lisinopril (PRINIVIL,ZESTRIL) 40 MG tablet Take 40 mg by mouth daily.      Marland Kitchen LORazepam (ATIVAN) 1 MG tablet Take by mouth. 1/2 to 1 daily when needed for nerves      . metoprolol succinate (TOPROL-XL) 100 MG 24 hr tablet Take 100 mg by mouth daily. Take with or immediately following a meal.      . Multiple Vitamin (MULTIVITAMIN) capsule Take 1 capsule by mouth daily.      Marland Kitchen omeprazole (PRILOSEC) 10 MG capsule Take 10 mg by mouth daily. Samples from PCP, pt does not know strength      . potassium chloride (K-DUR) 10 MEQ tablet Take 10 mEq by mouth daily.      . simvastatin (ZOCOR) 20 MG tablet Take 20 mg by mouth every evening.       No current facility-administered medications for this visit.    SMOKING HISTORY:  The patient has never smoked cigarettes.  HISTORY:  [name] was seen today for followup of her recurrent cancer involving the left breast.  The patient originally had DCIS and subsequently developed microscopic invasive carcinoma involving the nipple.  Estrogen and progesterone receptors have been negative in the past.  HER2/neu on the punch biopsies carried out on 09/16/2011 were positive with a ratio of 4.68.  The patient underwent a simple mastectomy of the left breast with sentinel node sampling on 10/25/2011. She has recovered from  the surgery.  We decided that in view of the microscopic focus which measured less than a 10th of a millimeter that the patient did not require any additional treatment.  The patient seems to be doing well.  She was last seen by [name] on 03/08/2012, 12/09/2011, and 11/10/2011.  She is without complaints today.  PHYSICAL EXAMINATION:  General:  The patient looks well, somewhat younger than her stated age of 52.  Weight is 162 pounds 9.6 ounces, height 5 feet 2  inches, body surface area 1.8 sq m.  Vital Signs:  Blood pressure 151/70.  Other vital signs are normal.  HEENT:  There is no scleral icterus.  Mouth and pharynx are benign.  No peripheral adenopathy palpable.  No axillary adenopathy.  Heart and lungs:  Normal. Breasts:  Right breast was benign.  Left breast is surgically absent with a well-healed incision.  No evidence for chest wall recurrence. Abdomen:  Benign with no organomegaly or masses palpable.  Extremities: No peripheral edema in the legs.  No lymphedema of the left arm. Neurologic:  Exam was normal.  LABORATORY DATA:  Today, white count 5.0, ANC 3.0, hemoglobin 12.5, hematocrit 36.2, platelets 244,000.  Chemistries were notable for a potassium of 2.9.  BUN 18, creatinine 1.1, albumin 3.8, and LDH 216. The patient was advised to take 2 doses of 20 mEq tonight separated by 2 or 3 hours.  Normally she takes 10 mEq daily.  It was also recommended that she call Dr. Clelia Croft for further instructions.  We are faxing the lab data to Dr. Clelia Croft.  A copy of the lab data was also given to the patient with recommendations to call Dr. Alver Fisher office in the morning.  IMAGING STUDIES:  1. Digital diagnostic bilateral mammogram from 11/29/2010 showed no  evidence of malignancy in either breast.  2. Chest x-ray, 2 view, from 07/07/2011 shows stable chronic and postoperative changes.  3. Digital diagnostic left mammogram on 09/12/2011: Showed findings  highly suspicious for DCIS in the sub areola left breast and left  nipple. There were new suspicious linearly oriented calcifications  in the sub areola, central and outer left breast that spanned  approximately 3.2 cm in maximum diameter. Calcifications extended  to the nipple which is now retracted. This is a new finding. The  nipple also looked thickened in appearance. There was no mass  associated with the left breast. Skin punch biopsy of the left  nipple was suggested.  4. PET scan from  10/06/2011 showed some mild hypermetabolism about the  left breast skin, but there was no evidence for hypermetabolic  metastatic disease. There was noted to be some hypermetabolism in  the right pelvic region felt to be most likely physiologic. There  was an incidental left-sided renal calculus and prominent  collateral veins in the right lower extremity. 5. Unilateral right digital screening mammogram carried out on 03/16/2012 showed no evidence of malignancy.  IMPRESSION AND PLAN:  Mrs. Colter seems to be doing well now 8 months from the diagnosis of her microscopic focus of invasive breast cancer. She is on no therapy at this time.  She is doing well.  Once again, we recommended that she take some extra potassium tonight, 40 mEq in 2 doses of 20 mEq each.  We will plan to see Mrs. Dauphin again in about 3 months, which will be around July 24th, at which time we will check CBC and chemistries.  The patient will also be due for a chest x-ray at  that time.    ______________________________ Samul Dada, M.D. DSM/MEDQ  D:  06/07/2012  T:  06/08/2012  Job:  [id number]

## 2012-06-18 ENCOUNTER — Telehealth: Payer: Self-pay | Admitting: Dietician

## 2012-08-21 ENCOUNTER — Telehealth: Payer: Self-pay | Admitting: Hematology and Oncology

## 2012-08-21 NOTE — Telephone Encounter (Signed)
Former DM pt called today to r/s 7/24 appt to 7/25. Pt has new d/t.

## 2012-09-06 ENCOUNTER — Ambulatory Visit: Payer: Medicare Other

## 2012-09-06 ENCOUNTER — Other Ambulatory Visit: Payer: Medicare Other | Admitting: Lab

## 2012-09-07 ENCOUNTER — Other Ambulatory Visit (HOSPITAL_BASED_OUTPATIENT_CLINIC_OR_DEPARTMENT_OTHER): Payer: Medicare Other

## 2012-09-07 ENCOUNTER — Telehealth: Payer: Self-pay | Admitting: *Deleted

## 2012-09-07 ENCOUNTER — Ambulatory Visit (HOSPITAL_BASED_OUTPATIENT_CLINIC_OR_DEPARTMENT_OTHER): Payer: Medicare Other | Admitting: Hematology and Oncology

## 2012-09-07 VITALS — BP 166/78 | HR 60 | Temp 97.0°F | Resp 18 | Ht 62.0 in | Wt 165.2 lb

## 2012-09-07 DIAGNOSIS — C50912 Malignant neoplasm of unspecified site of left female breast: Secondary | ICD-10-CM

## 2012-09-07 DIAGNOSIS — D0510 Intraductal carcinoma in situ of unspecified breast: Secondary | ICD-10-CM

## 2012-09-07 DIAGNOSIS — D0590 Unspecified type of carcinoma in situ of unspecified breast: Secondary | ICD-10-CM

## 2012-09-07 DIAGNOSIS — C50919 Malignant neoplasm of unspecified site of unspecified female breast: Secondary | ICD-10-CM

## 2012-09-07 LAB — CBC WITH DIFFERENTIAL/PLATELET
BASO%: 1.5 % (ref 0.0–2.0)
Basophils Absolute: 0.1 10*3/uL (ref 0.0–0.1)
EOS%: 2.5 % (ref 0.0–7.0)
Eosinophils Absolute: 0.1 10*3/uL (ref 0.0–0.5)
HCT: 35.2 % (ref 34.8–46.6)
HGB: 12.2 g/dL (ref 11.6–15.9)
LYMPH%: 30.3 % (ref 14.0–49.7)
MCH: 31.2 pg (ref 25.1–34.0)
MCHC: 34.7 g/dL (ref 31.5–36.0)
MCV: 90 fL (ref 79.5–101.0)
MONO#: 0.4 10*3/uL (ref 0.1–0.9)
MONO%: 10.5 % (ref 0.0–14.0)
NEUT#: 2.2 10*3/uL (ref 1.5–6.5)
NEUT%: 55.2 % (ref 38.4–76.8)
Platelets: 227 10*3/uL (ref 145–400)
RBC: 3.91 10*6/uL (ref 3.70–5.45)
RDW: 13.5 % (ref 11.2–14.5)
WBC: 3.9 10*3/uL (ref 3.9–10.3)
lymph#: 1.2 10*3/uL (ref 0.9–3.3)

## 2012-09-07 LAB — LACTATE DEHYDROGENASE (CC13): LDH: 219 U/L (ref 125–245)

## 2012-09-07 LAB — COMPREHENSIVE METABOLIC PANEL (CC13)
ALT: 12 U/L (ref 0–55)
AST: 22 U/L (ref 5–34)
Albumin: 3.9 g/dL (ref 3.5–5.0)
Alkaline Phosphatase: 63 U/L (ref 40–150)
BUN: 11.2 mg/dL (ref 7.0–26.0)
CO2: 30 mEq/L — ABNORMAL HIGH (ref 22–29)
Calcium: 9.7 mg/dL (ref 8.4–10.4)
Chloride: 104 mEq/L (ref 98–109)
Creatinine: 0.9 mg/dL (ref 0.6–1.1)
Glucose: 130 mg/dl (ref 70–140)
Potassium: 3.1 mEq/L — ABNORMAL LOW (ref 3.5–5.1)
Sodium: 144 mEq/L (ref 136–145)
Total Bilirubin: 0.51 mg/dL (ref 0.20–1.20)
Total Protein: 7.1 g/dL (ref 6.4–8.3)

## 2012-09-07 NOTE — Telephone Encounter (Signed)
appts made and printed...td 

## 2012-09-07 NOTE — Progress Notes (Signed)
CC:   Lupita Raider, M.D. [name], M.D. [name], M.D.  PROBLEM LIST:  1. Ductal carcinoma in situ, focal high-grade involving the left  breast, left upper quadrant, less than 0.5 cm. Estrogen and  progesterone receptor negative, diagnosed by needle biopsy on  11/23/2006 with needle localization biopsy on 12/25/2006. The  patient was lost to followup, although the plan was for her to have  radiation treatments at that time. When the patient reappeared about a year later, she was found to have recurrent DCIS in October 2009. She underwent several excisions with negative margins by 01/16/2008.  She underwent radiation treatments to the breast from 03/03/2008  through 03/27/2008.   2. Infiltrating carcinoma involving the left breast, associated with  Paget's disease. Punch biopsies of the nipple area were obtained by  Dr. Cicero Duck on 09/16/2011. There were infiltrating strands in  small clusters of atypical cells within the dermis. The overlying  epidermis was ulcerated or detached and replaced by an inflamed  crust. Estrogen and progesterone receptors are negative. HER-  2/neu by CISH shows amplification with a ratio of 4.68. PET scan  carried out on 10/06/2011 shows no evidence for hypermetabolic  metastatic disease. Ms. Kamath underwent a simple mastectomy with sentinel lymph node biopsy on 10/25/2011 by Dr. Cicero Duck. She was found to have microscopic foci, less than 0.1 mm, arising from high-grade DCIS involving the nipple, grade 3/3 (Paget's disease.) All 3 sentinel lymph nodes were free of  tumor. Resection margins were negative. Extensive intraductal  component was absent. Tumor stage was felt to be best characterized as  pTmi N0 M0.   3. Hypertension.  4. Dyslipidemia.  5. Diverticulosis of the sigmoid colon.  6. Gastroesophageal reflux disease.  7. Mild leukopenia.    MEDICATIONS:  Reviewed and recorded. Current Outpatient Prescriptions   Medication Sig Dispense Refill  . hydrochlorothiazide (HYDRODIURIL) 25 MG tablet Take 25 mg by mouth daily.      Marland Kitchen lisinopril (PRINIVIL,ZESTRIL) 40 MG tablet Take 40 mg by mouth daily.      . metoprolol succinate (TOPROL-XL) 100 MG 24 hr tablet Take 100 mg by mouth daily. Take with or immediately following a meal.      . Multiple Vitamin (MULTIVITAMIN) capsule Take 1 capsule by mouth daily.      . potassium chloride (K-DUR) 10 MEQ tablet Take 10 mEq by mouth daily.      . simvastatin (ZOCOR) 20 MG tablet Take 20 mg by mouth every evening.       No current facility-administered medications for this visit.    SMOKING HISTORY:  The patient has never smoked cigarettes.  HISTORY:  Rhylin Venters was seen today for followup of her recurrent cancer involving the left breast.  The patient originally had DCIS and subsequently developed microscopic invasive carcinoma involving the nipple.  Estrogen and progesterone receptors have been negative in the past.  HER2/neu on the punch biopsies carried out on 09/16/2011 were positive with a ratio of 4.68.  The patient underwent a simple mastectomy of the left breast with sentinel node sampling on 10/25/2011. She has recovered from the surgery.  We decided that in view of the microscopic focus which measured less than a 10th of a millimeter that the patient did not require any additional treatment.  The patient seems to be doing well. She is without complaints today.  Blood pressure 166/78, pulse 60, temperature 97 F (36.1 C), temperature source Oral, resp. rate 18, height 5\' 2"  (1.575  m), weight 165 lb 3.2 oz (74.934 kg). PHYSICAL EXAMINATION:  General:  The patient looks well, somewhat younger than her stated age of 48.   HEENT:  There is no scleral icterus.  Mouth and pharynx are benign.  No peripheral adenopathy palpable.  No axillary adenopathy.  Heart and lungs:  Normal. Breasts:  Right breast was benign.  Left breast is surgically absent with a  well-healed incision.  No evidence for chest wall recurrence. Abdomen:  Benign with no organomegaly or masses palpable.  Extremities: No peripheral edema in the legs.  No lymphedema of the left arm. Neurologic:  Exam was normal.  LABORATORY DATA:   CBC    Component Value Date/Time   WBC 3.9 09/07/2012 1406   WBC 5.2 12/24/2007 1549   RBC 3.91 09/07/2012 1406   RBC 4.10 12/24/2007 1549   HGB 12.2 09/07/2012 1406   HGB 12.5 10/25/2011 0953   HCT 35.2 09/07/2012 1406   HCT 36.4 12/24/2007 1549   PLT 227 09/07/2012 1406   PLT 276 12/24/2007 1549   MCV 90.0 09/07/2012 1406   MCV 88.9 12/24/2007 1549   MCH 31.2 09/07/2012 1406   MCH 31.6 02/25/2010 1311   MCHC 34.7 09/07/2012 1406   MCHC 34.2 12/24/2007 1549   RDW 13.5 09/07/2012 1406   RDW 14.5 12/24/2007 1549   LYMPHSABS 1.2 09/07/2012 1406   LYMPHSABS 2.2 12/24/2007 1549   MONOABS 0.4 09/07/2012 1406   MONOABS 0.5 12/24/2007 1549   EOSABS 0.1 09/07/2012 1406   EOSABS 0.1 12/24/2007 1549   BASOSABS 0.1 09/07/2012 1406   BASOSABS 0.0 12/24/2007 1549    Lab Results  Component Value Date   GLUCOSE 130 09/07/2012   BUN 11.2 09/07/2012   CO2 30* 09/07/2012   ALT 12 09/07/2012   AST 22 09/07/2012   LDH 219 09/07/2012   K 3.1* 09/07/2012   CREATININE 0.9 09/07/2012    IMAGING STUDIES:  1. Digital diagnostic bilateral mammogram from 11/29/2010 showed no  evidence of malignancy in either breast.  2. Chest x-ray, 2 view, from 07/07/2011 shows stable chronic and postoperative changes.  3. Digital diagnostic left mammogram on 09/12/2011: Showed findings  highly suspicious for DCIS in the sub areola left breast and left  nipple. There were new suspicious linearly oriented calcifications  in the sub areola, central and outer left breast that spanned  approximately 3.2 cm in maximum diameter. Calcifications extended  to the nipple which is now retracted. This is a new finding. The  nipple also looked thickened in appearance. There was no mass  associated with  the left breast. Skin punch biopsy of the left  nipple was suggested.  4. PET scan from 10/06/2011 showed some mild hypermetabolism about the  left breast skin, but there was no evidence for hypermetabolic  metastatic disease. There was noted to be some hypermetabolism in  the right pelvic region felt to be most likely physiologic. There  was an incidental left-sided renal calculus and prominent  collateral veins in the right lower extremity. 5. Unilateral right digital screening mammogram carried out on 03/16/2012 showed no evidence of malignancy.  IMPRESSION AND PLAN:  Mrs. Joynt seems to be doing well now almost a year from the diagnosis of her microscopic focus of invasive breast cancer. She is on no therapy at this time.   We will plan to see Mrs. Silos again in about 6 months, at which time we will check CBC and chemistries and repeat mammogram in 1 year.  Zachery Dakins, MD 09/07/2012 2:40 PM

## 2012-11-05 ENCOUNTER — Encounter (HOSPITAL_COMMUNITY): Payer: Self-pay

## 2012-11-05 ENCOUNTER — Ambulatory Visit (HOSPITAL_COMMUNITY): Admission: RE | Admit: 2012-11-05 | Payer: Medicare Other | Source: Ambulatory Visit

## 2012-11-05 ENCOUNTER — Emergency Department (HOSPITAL_COMMUNITY)
Admission: EM | Admit: 2012-11-05 | Discharge: 2012-11-05 | Discharge status: Against Medical Advice | Payer: Medicare Other | Source: Home / Self Care

## 2012-11-05 DIAGNOSIS — M25569 Pain in unspecified knee: Secondary | ICD-10-CM

## 2012-11-05 DIAGNOSIS — M25561 Pain in right knee: Secondary | ICD-10-CM

## 2012-11-05 NOTE — ED Provider Notes (Signed)
CSN: [id number]     Arrival date & time 11/05/12  1139 History   None    No chief complaint on file.  (Consider location/radiation/quality/duration/timing/severity/associated sxs/prior Treatment) Patient is a 79 y.o. female presenting with knee pain. The history is provided by the patient. No language interpreter was used.  Knee Pain Location:  Knee Injury: no   Pain details:    Quality:  Aching   Radiates to:  Does not radiate   Severity:  No pain   Onset quality:  Sudden   Timing:  Constant   Progression:  Worsening Dislocation: no   Foreign body present:  No foreign bodies Prior injury to area:  Yes Relieved by:  Nothing Worsened by:  Nothing tried Ineffective treatments:  None tried Associated symptoms: no decreased ROM     Past Medical History  Diagnosis Date  . Cancer of breast, intraductal   . GERD (gastroesophageal reflux disease)   . HTN (hypertension)   . Dyslipidemia   . Blood transfusion   . Cancer   . Breast cancer, left 09/26/2011    Punch biopsies of the left nipple done on 09/16/2011.  ER and PR were negative.  HER2 is positive with ratio 4.68.Had mastectomy on 10/25/11. Path T1,N0.    Past Surgical History  Procedure Laterality Date  . Rotator cuff repair      Bilateral, 2010  . Total abdominal hysterectomy    . Breast surgery  2009    lt lumpectomy-plus 2 re-excisions for margins  . Back surgery  2009    lumb lam  . Left breast mastectomy  2013   Family History  Problem Relation Age of Onset  . Cancer Sister     breast  . Breast cancer Sister   . Cancer Sister     lung  . Diabetes Son   . Colon cancer Brother   . Esophageal cancer Neg Hx   . Rectal cancer Neg Hx   . Stomach cancer Neg Hx    History  Substance Use Topics  . Smoking status: Never Smoker   . Smokeless tobacco: Never Used  . Alcohol Use: No   OB History   Grav Para Term Preterm Abortions TAB SAB Ect Mult Living                 Review of Systems  All other systems  reviewed and are negative.    Allergies  Amoxicillin and Codeine  Home Medications   Current Outpatient Rx  Name  Route  Sig  Dispense  Refill  . hydrochlorothiazide (HYDRODIURIL) 25 MG tablet   Oral   Take 25 mg by mouth daily.         Marland Kitchen lisinopril (PRINIVIL,ZESTRIL) 40 MG tablet   Oral   Take 40 mg by mouth daily.         . metoprolol succinate (TOPROL-XL) 100 MG 24 hr tablet   Oral   Take 100 mg by mouth daily. Take with or immediately following a meal.         . Multiple Vitamin (MULTIVITAMIN) capsule   Oral   Take 1 capsule by mouth daily.         Marland Kitchen EXPIRED: potassium chloride (K-DUR) 10 MEQ tablet   Oral   Take 10 mEq by mouth daily.         . simvastatin (ZOCOR) 20 MG tablet   Oral   Take 20 mg by mouth every evening.  BP 171/98  Pulse 65  Temp(Src) 97.9 F (36.6 C) (Oral)  Resp 18  SpO2 100% Physical Exam  Nursing note and vitals reviewed. Constitutional: She is oriented to person, place, and time. She appears well-developed and well-nourished.  Musculoskeletal: She exhibits tenderness.  Tender right knee pain with range of motion  nv and ns intact  Neurological: She is alert and oriented to person, place, and time. She has normal reflexes.  Skin: Skin is warm.  Psychiatric: She has a normal mood and affect.    ED Course  Procedures (including critical care time) Labs Review Labs Reviewed - No data to display Imaging Review No results found.  MDM   1. Knee pain, right      Pt declined xray,   Pt did not want to wait.   I advised her I think she needs an xray.   Pt ama   Lonia Skinner [place], [place] 11/05/12 1451

## 2012-11-05 NOTE — ED Notes (Signed)
Reportedly fell in a hole in the yard, injury to right knee (husband at home to fill in hole in yard) NAD at present

## 2012-11-06 NOTE — ED Provider Notes (Signed)
Medical screening examination/treatment/procedure(s) were performed by a resident physician or non-physician practitioner and as the supervising physician I was immediately available for consultation/collaboration.  Clementeen Graham, MD    Rodolph Bong, MD 11/06/12 [phone]

## 2012-12-03 ENCOUNTER — Encounter: Payer: Self-pay | Admitting: Interventional Cardiology

## 2012-12-03 DIAGNOSIS — E785 Hyperlipidemia, unspecified: Secondary | ICD-10-CM | POA: Insufficient documentation

## 2012-12-03 DIAGNOSIS — D0510 Intraductal carcinoma in situ of unspecified breast: Secondary | ICD-10-CM | POA: Insufficient documentation

## 2012-12-03 DIAGNOSIS — I10 Essential (primary) hypertension: Secondary | ICD-10-CM | POA: Insufficient documentation

## 2012-12-04 ENCOUNTER — Encounter: Payer: Self-pay | Admitting: Interventional Cardiology

## 2012-12-06 ENCOUNTER — Ambulatory Visit (INDEPENDENT_AMBULATORY_CARE_PROVIDER_SITE_OTHER): Payer: Medicare Other | Admitting: Interventional Cardiology

## 2012-12-06 ENCOUNTER — Encounter: Payer: Self-pay | Admitting: Interventional Cardiology

## 2012-12-06 VITALS — BP 118/72 | HR 55 | Ht 62.0 in | Wt 163.8 lb

## 2012-12-06 DIAGNOSIS — I10 Essential (primary) hypertension: Secondary | ICD-10-CM

## 2012-12-06 MED ORDER — AMLODIPINE BESYLATE 5 MG PO TABS: 5.0000 mg | ORAL_TABLET | Freq: Every day | ORAL | Status: DC

## 2012-12-06 NOTE — Patient Instructions (Signed)
Your physician wants you to follow-up in: 1 year with Dr. Varanasi. You will receive a reminder letter in the mail two months in advance. If you don't receive a letter, please call our office to schedule the follow-up appointment.  Your physician recommends that you continue on your current medications as directed. Please refer to the Current Medication list given to you today.  

## 2012-12-06 NOTE — Progress Notes (Signed)
Patient ID: Judy [name], female   DOB: July 29, 1930, 79 y.o.   MRN: [id number]    [address] [place], [place]  [address] Phone: [phone] Fax:  [phone]  Date:  12/06/2012   ID:  Judy [name], DOB 1930-12-09, MRN [id number]  PCP:  Lupita Raider, MD      History of Present Illness: Judy [name] is a 79 y.o. female with a hx of HTN for 20 years. BP was controlled until 10/13 when she had a mastectomy for Paget's disease. She was taking about 2 medicines for her BP prior to this operation. She was taking Lisinopril and amlodipine. Amlodipine was stopped and her BP seemed to increase, just before surgery, due to swelling in her legs. She states she had been on it for years. Since the surgery, metoprolol and clonidine have been added. She had readings in the 180/100 range. She only took the Clonidine at night but was having problems with increase fatigue, and dry mouth. Dr Eldridge Dace stopped the Clonidine and restarted the Norvasc 5 mg qd since it controlled her BP better, which she was tolerating well. On 05/08/12 I increased her Amlodipine to 10 mg qd due to BP 177/84. After further discussion she realized she was having alot of stress related to the mastectomy and saw Dr Clelia Croft wtih Sertraline started for her anxiety but had to stop unable to tolerate and she is using Lorazepam prn. She is feeling better and her blood pressure has improved. She feels well now without complaints. .  Patient denies chest pain, palpitations, dizziness, syncope, swelling, SOB, nor PND.    Wt Readings from Last 3 Encounters:  12/06/12 163 lb 12.8 oz (74.299 kg)  09/07/12 165 lb 3.2 oz (74.934 kg)  06/07/12 162 lb 9.6 oz (73.755 kg)     Past Medical History  Diagnosis Date  . Cancer of breast, intraductal   . GERD (gastroesophageal reflux disease)   . HTN (hypertension)   . Dyslipidemia   . Blood transfusion   . Cancer   . Breast cancer, left 09/26/2011    Punch biopsies of the left nipple  done on 09/16/2011.  ER and PR were negative.  HER2 is positive with ratio 4.68.Had mastectomy on 10/25/11. Path T1,N0.     Current Outpatient Prescriptions  Medication Sig Dispense Refill  . amLODipine (NORVASC) 2.5 MG tablet Take 2.5 mg by mouth daily.      . cyclobenzaprine (FLEXERIL) 10 MG tablet Take 10 mg by mouth 3 (three) times daily as needed for muscle spasms.      . hydrochlorothiazide (HYDRODIURIL) 25 MG tablet Take 25 mg by mouth daily.      Marland Kitchen ibuprofen (ADVIL,MOTRIN) 100 MG tablet Take 100 mg by mouth every 6 (six) hours as needed for fever.      Marland Kitchen lisinopril (PRINIVIL,ZESTRIL) 40 MG tablet Take 40 mg by mouth daily.      . metoprolol succinate (TOPROL-XL) 100 MG 24 hr tablet Take 100 mg by mouth daily. Take with or immediately following a meal.      . Multiple Vitamin (MULTIVITAMIN) capsule Take 1 capsule by mouth daily.      . nabumetone (RELAFEN) 500 MG tablet Take 500 mg by mouth 2 (two) times daily as needed for pain.      . potassium chloride (K-DUR) 10 MEQ tablet Take 10 mEq by mouth daily.      . sertraline (ZOLOFT) 50 MG tablet Take 50 mg by mouth daily.      Marland Kitchen  simvastatin (ZOCOR) 20 MG tablet Take 20 mg by mouth every evening.       No current facility-administered medications for this visit.    Allergies:    Allergies  Allergen Reactions  . Amoxicillin Nausea Only  . Codeine Nausea Only    Social History:  The patient  reports that she has never smoked. She has never used smokeless tobacco. She reports that she does not drink alcohol or use illicit drugs.   Family History:  The patient's family history includes Breast cancer in her sister; Cancer in her sister and sister; Colon cancer in her brother; Diabetes in her son. There is no history of Esophageal cancer, Rectal cancer, or Stomach cancer.   ROS:  Please see the history of present illness.  No nausea, vomiting.  No fevers, chills.  No focal weakness.  No dysuria.   All other systems reviewed and negative.     PHYSICAL EXAM: VS:  BP 118/72  Pulse 55  Ht 5\' 2"  (1.575 m)  Wt 163 lb 12.8 oz (74.299 kg)  BMI 29.95 kg/m2  SpO2 99% Well nourished, well developed, in no acute distress HEENT: normal Neck: no JVD, no carotid bruits Cardiac:  normal S1, S2; RRR;  Lungs:  clear to auscultation bilaterally, no wheezing, rhonchi or rales Abd: soft, nontender, no hepatomegaly Ext: no edema Skin: warm and dry Neuro:   no focal abnormalities noted      ASSESSMENT AND PLAN:  Essential hypertension, benign  Continue Amlodipine Besylate Tablet, 2.5 MG, 2 tablet, Orally, Once a day Continue Toprol XL Tablet Extended Release 24 Hour, 100 MG, 1 tablet, Orally, Once a day Continue HCTZ 25 MG Tablet, 25 MG, 1 tablet, Orally, Once a day Continue Lisinopril Tablet, 40 MG, 1 tablet, Orally, Once a day Continue K-Dur Tablet, 10 meq, 1 tablet, Orally, once a day Notes: pt will continue home monitoring and call if BP>150/90, no changes at this time, much improved and stable. Tolerating 5 mg of amlodipine.  SHe had swelling in her legs with 10 mg daily.  It was not painful and she had no problems getting her shoes on.  She would be willing to tolerate the swelling if her BP was controlled on amlodipine 10 mg daily.  BP much lower on my recheck after she sat and rested for a while.    Signed, Fredric Mare, MD, [facility] 12/06/2012 9:39 AM

## 2012-12-06 NOTE — Addendum Note (Signed)
Addended byOrlene Plum H on: 12/06/2012 10:01 AM   Modules accepted: Orders

## 2013-03-08 ENCOUNTER — Other Ambulatory Visit: Payer: Self-pay | Admitting: Internal Medicine

## 2013-03-08 DIAGNOSIS — D0510 Intraductal carcinoma in situ of unspecified breast: Secondary | ICD-10-CM

## 2013-03-11 ENCOUNTER — Ambulatory Visit (HOSPITAL_BASED_OUTPATIENT_CLINIC_OR_DEPARTMENT_OTHER): Payer: Medicare Other | Admitting: Internal Medicine

## 2013-03-11 ENCOUNTER — Telehealth: Payer: Self-pay | Admitting: Internal Medicine

## 2013-03-11 ENCOUNTER — Other Ambulatory Visit (HOSPITAL_BASED_OUTPATIENT_CLINIC_OR_DEPARTMENT_OTHER): Payer: Medicare Other

## 2013-03-11 VITALS — BP 163/72 | HR 57 | Temp 97.6°F | Resp 20 | Ht 62.0 in | Wt 167.4 lb

## 2013-03-11 DIAGNOSIS — D0510 Intraductal carcinoma in situ of unspecified breast: Secondary | ICD-10-CM

## 2013-03-11 DIAGNOSIS — I10 Essential (primary) hypertension: Secondary | ICD-10-CM

## 2013-03-11 DIAGNOSIS — C50019 Malignant neoplasm of nipple and areola, unspecified female breast: Secondary | ICD-10-CM

## 2013-03-11 DIAGNOSIS — E876 Hypokalemia: Secondary | ICD-10-CM

## 2013-03-11 DIAGNOSIS — C50912 Malignant neoplasm of unspecified site of left female breast: Secondary | ICD-10-CM

## 2013-03-11 LAB — CBC WITH DIFFERENTIAL/PLATELET
BASO%: 1.1 % (ref 0.0–2.0)
Basophils Absolute: 0.1 10*3/uL (ref 0.0–0.1)
EOS%: 3.7 % (ref 0.0–7.0)
Eosinophils Absolute: 0.2 10*3/uL (ref 0.0–0.5)
HCT: 38.5 % (ref 34.8–46.6)
HGB: 13 g/dL (ref 11.6–15.9)
LYMPH%: 32.5 % (ref 14.0–49.7)
MCH: 30.7 pg (ref 25.1–34.0)
MCHC: 33.8 g/dL (ref 31.5–36.0)
MCV: 90.8 fL (ref 79.5–101.0)
MONO#: 0.7 10*3/uL (ref 0.1–0.9)
MONO%: 14.1 % — ABNORMAL HIGH (ref 0.0–14.0)
NEUT#: 2.4 10*3/uL (ref 1.5–6.5)
NEUT%: 48.6 % (ref 38.4–76.8)
Platelets: 254 10*3/uL (ref 145–400)
RBC: 4.24 10*6/uL (ref 3.70–5.45)
RDW: 14.6 % — ABNORMAL HIGH (ref 11.2–14.5)
WBC: 4.9 10*3/uL (ref 3.9–10.3)
lymph#: 1.6 10*3/uL (ref 0.9–3.3)

## 2013-03-11 LAB — LACTATE DEHYDROGENASE (CC13): LDH: 228 U/L (ref 125–245)

## 2013-03-11 LAB — COMPREHENSIVE METABOLIC PANEL (CC13)
ALT: 25 U/L (ref 0–55)
AST: 26 U/L (ref 5–34)
Albumin: 4.1 g/dL (ref 3.5–5.0)
Alkaline Phosphatase: 65 U/L (ref 40–150)
Anion Gap: 9 mEq/L (ref 3–11)
BUN: 12.1 mg/dL (ref 7.0–26.0)
CO2: 34 mEq/L — ABNORMAL HIGH (ref 22–29)
Calcium: 9.8 mg/dL (ref 8.4–10.4)
Chloride: 100 mEq/L (ref 98–109)
Creatinine: 0.9 mg/dL (ref 0.6–1.1)
Glucose: 106 mg/dl (ref 70–140)
Potassium: 3.3 mEq/L — ABNORMAL LOW (ref 3.5–5.1)
Sodium: 143 mEq/L (ref 136–145)
Total Bilirubin: 0.51 mg/dL (ref 0.20–1.20)
Total Protein: 7.3 g/dL (ref 6.4–8.3)

## 2013-03-11 NOTE — Telephone Encounter (Signed)
gv pt appt schedule for july °

## 2013-03-12 NOTE — Progress Notes (Signed)
[facility] OFFICE PROGRESS NOTE  SHAW,KIMBERLEE, MD [place] [address] [place] [address]  DIAGNOSIS: Breast cancer, left - Plan: CBC with Differential, Comprehensive metabolic panel (Cmet) - [facility], Lactate dehydrogenase (LDH) - [facility]  DCIS (ductal carcinoma in situ) of breast  Cancer of breast, intraductal  HTN (hypertension)  Chief Complaint  Patient presents with  . DCIS (ductal carcinoma in situ) of breast, unspecified later    CURRENT THERAPY:  INTERVAL HISTORY: Judy [name] 80 y.o. female with a history of recurrent cancer  involving the left breast is here for follow-up.  She was last seen by Dr. Jilda Roche on 09/07/2012.  The patient originally had DCIS and  subsequently developed microscopic invasive carcinoma involving the nipple. Estrogen and progesterone receptors have been negative in the past. HER2/neu on the punch biopsies carried out on 09/16/2011 were positive with a ratio of 4.68. The patient underwent a simple mastectomy of the left breast with sentinel node sampling on 10/25/2011. She has recovered from the surgery. We decided that in view of the microscopic focus which measured less than a 10th of a millimeter that the patient did not require any additional treatment. The patient seems  to be doing well. She is without complaints today.   MEDICAL HISTORY: Past Medical History  Diagnosis Date  . Cancer of breast, intraductal   . GERD (gastroesophageal reflux disease)   . HTN (hypertension)   . Dyslipidemia   . Blood transfusion   . Cancer   . Breast cancer, left 09/26/2011    Punch biopsies of the left nipple done on 09/16/2011.  ER and PR were negative.  HER2 is positive with ratio 4.68.Had mastectomy on 10/25/11. Path T1,N0.     INTERIM HISTORY: has DCIS (ductal carcinoma in situ) of breast; Breast cancer, left; Cancer of breast, intraductal; HTN (hypertension); and Dyslipidemia on her problem list.    ALLERGIES:  is allergic to  amoxicillin and codeine.  MEDICATIONS: has a current medication list which includes the following prescription(s): amlodipine, cyclobenzaprine, hydrochlorothiazide, ibuprofen, lisinopril, metoprolol succinate, multivitamin, nabumetone, potassium chloride, sertraline, simvastatin, and potassium chloride.  SURGICAL HISTORY:  Past Surgical History  Procedure Laterality Date  . Rotator cuff repair      Bilateral, 2010  . Total abdominal hysterectomy    . Breast surgery  2009    lt lumpectomy-plus 2 re-excisions for margins  . Back surgery  2009    lumb lam  . Left breast mastectomy  2013   PROBLEM LIST:  1. Ductal carcinoma in situ, focal high-grade involving the left  breast, left upper quadrant, less than 0.5 cm. Estrogen and  progesterone receptor negative, diagnosed by needle biopsy on  11/23/2006 with needle localization biopsy on 12/25/2006. The  patient was lost to followup, although the plan was for her to have  radiation treatments at that time. When the patient reappeared about a year later, she was found to have recurrent DCIS in October 2009. She underwent several excisions with negative margins by 01/16/2008. She underwent radiation treatments to the breast from 03/03/2008 through 03/27/2008.  2. Infiltrating carcinoma involving the left breast, associated with  Paget's disease. Punch biopsies of the nipple area were obtained by Dr. Osborn Coho on 09/16/2011. There were infiltrating strands in small clusters of atypical cells within the dermis. The overlying epidermis was ulcerated or detached and replaced by an inflamed crust. Estrogen and progesterone receptors are negative. HER- 2/neu by [facility] shows amplification with a ratio of 4.68. PET  scan carried out on 10/06/2011 shows no evidence for hypermetabolic metastatic disease. [name] underwent a simple mastectomy with sentinel lymph node biopsy on 10/25/2011 by Dr. Osborn Coho. She was found to have microscopic foci, less than  0.1 mm, arising from high-grade DCIS involving the nipple, grade 3/3 (Paget's disease.) All 3 sentinel lymph nodes were free of tumor. Resection margins were negative. Extensive intraductal component was absent. Tumor stage was felt to be best characterized as  pTmi N0 M0.  3. Hypertension.  4. Dyslipidemia.  5. Diverticulosis of the sigmoid colon.  6. Gastroesophageal reflux disease.  7. Mild leukopenia.  REVIEW OF SYSTEMS:   Constitutional: Denies fevers, chills or abnormal weight loss Eyes: Denies blurriness of vision Ears, nose, mouth, throat, and face: Denies mucositis or sore throat Respiratory: Denies cough, dyspnea or wheezes Cardiovascular: Denies palpitation, chest discomfort or lower extremity swelling Gastrointestinal:  Denies nausea, heartburn or change in bowel habits Skin: Denies abnormal skin rashes Lymphatics: Denies new lymphadenopathy or easy bruising Neurological:Denies numbness, tingling or new weaknesses Behavioral/Psych: Mood is stable, no new changes  All other systems were reviewed with the patient and are negative.  PHYSICAL EXAMINATION: ECOG PERFORMANCE STATUS: 0 - Asymptomatic  Blood pressure 163/72, pulse 57, temperature 97.6 F (36.4 C), temperature source Oral, resp. rate 20, height '5\' 2"'  (1.575 m), weight 167 lb 6.4 oz (75.932 kg).  GENERAL:alert, no distress and comfortable SKIN: skin color, texture, turgor are normal, no rashes or significant lesions EYES: normal, Conjunctiva are pink and non-injected, sclera clear OROPHARYNX:no exudate, no erythema and lips, buccal mucosa, and tongue normal  NECK: supple, thyroid normal size, non-tender, without nodularity LYMPH:  no palpable lymphadenopathy in the cervical, axillary or supraclavicular LUNGS: clear to auscultation and percussion with normal breathing effort Breasts: Right breast was benign. Left breast is surgically absent  with a well-healed incision. No evidence for chest wall  recurrence. HEART: regular rate & rhythm and no murmurs and no lower extremity edema ABDOMEN:abdomen soft, non-tender and normal bowel sounds Musculoskeletal:no cyanosis of digits and no clubbing  NEURO: alert & oriented x 3 with fluent speech, no focal motor/sensory deficits   LABORATORY DATA: Results for orders placed in visit on 03/11/13 (from the past 48 hour(s))  CBC WITH DIFFERENTIAL     Status: Abnormal   Collection Time    03/11/13  2:30 PM      Result Value Range   WBC 4.9  3.9 - 10.3 10e3/uL   NEUT# 2.4  1.5 - 6.5 10e3/uL   HGB 13.0  11.6 - 15.9 g/dL   HCT 38.5  34.8 - 46.6 %   Platelets 254  145 - 400 10e3/uL   MCV 90.8  79.5 - 101.0 fL   MCH 30.7  25.1 - 34.0 pg   MCHC 33.8  31.5 - 36.0 g/dL   RBC 4.24  3.70 - 5.45 10e6/uL   RDW 14.6 (*) 11.2 - 14.5 %   lymph# 1.6  0.9 - 3.3 10e3/uL   MONO# 0.7  0.1 - 0.9 10e3/uL   Eosinophils Absolute 0.2  0.0 - 0.5 10e3/uL   Basophils Absolute 0.1  0.0 - 0.1 10e3/uL   NEUT% 48.6  38.4 - 76.8 %   LYMPH% 32.5  14.0 - 49.7 %   MONO% 14.1 (*) 0.0 - 14.0 %   EOS% 3.7  0.0 - 7.0 %   BASO% 1.1  0.0 - 2.0 %  LACTATE DEHYDROGENASE (CC13)     Status: None   Collection Time  03/11/13  2:30 PM      Result Value Range   LDH 228  125 - 245 U/L  COMPREHENSIVE METABOLIC PANEL (KL49)     Status: Abnormal   Collection Time    03/11/13  2:31 PM      Result Value Range   Sodium 143  136 - 145 mEq/L   Potassium 3.3 (*) 3.5 - 5.1 mEq/L   Chloride 100  98 - 109 mEq/L   CO2 34 (*) 22 - 29 mEq/L   Glucose 106  70 - 140 mg/dl   BUN 12.1  7.0 - 26.0 mg/dL   Creatinine 0.9  0.6 - 1.1 mg/dL   Total Bilirubin 0.51  0.20 - 1.20 mg/dL   Alkaline Phosphatase 65  40 - 150 U/L   AST 26  5 - 34 U/L   ALT 25  0 - 55 U/L   Total Protein 7.3  6.4 - 8.3 g/dL   Albumin 4.1  3.5 - 5.0 g/dL   Calcium 9.8  8.4 - 10.4 mg/dL   Anion Gap 9  3 - 11 mEq/L       Labs:  Lab Results  Component Value Date   WBC 4.9 03/11/2013   HGB 13.0 03/11/2013   HCT  38.5 03/11/2013   MCV 90.8 03/11/2013   PLT 254 03/11/2013   NEUTROABS 2.4 03/11/2013      Chemistry      Component Value Date/Time   NA 143 03/11/2013 1431   NA 142 10/21/2011 0900   K 3.3* 03/11/2013 1431   K 3.3* 10/21/2011 0900   CL 100 06/07/2012 1525   CL 101 10/21/2011 0900   CO2 34* 03/11/2013 1431   CO2 33* 10/21/2011 0900   BUN 12.1 03/11/2013 1431   BUN 9 10/21/2011 0900   CREATININE 0.9 03/11/2013 1431   CREATININE 0.80 10/21/2011 0900      Component Value Date/Time   CALCIUM 9.8 03/11/2013 1431   CALCIUM 9.9 10/21/2011 0900   ALKPHOS 65 03/11/2013 1431   ALKPHOS 61 09/12/2011 0833   AST 26 03/11/2013 1431   AST 20 09/12/2011 0833   ALT 25 03/11/2013 1431   ALT 13 09/12/2011 0833   BILITOT 0.51 03/11/2013 1431   BILITOT 0.5 09/12/2011 0833     Basic Metabolic Panel:  Recent Labs Lab 03/11/13 1431  NA 143  K 3.3*  CO2 34*  GLUCOSE 106  BUN 12.1  CREATININE 0.9  CALCIUM 9.8   GFR Estimated Creatinine Clearance: 47.5 ml/min (by C-G formula based on Cr of 0.9). Liver Function Tests:  Recent Labs Lab 03/11/13 1431  AST 26  ALT 25  ALKPHOS 65  BILITOT 0.51  PROT 7.3  ALBUMIN 4.1    CBC:  Recent Labs Lab 03/11/13 1430  WBC 4.9  NEUTROABS 2.4  HGB 13.0  HCT 38.5  MCV 90.8  PLT 254   Studies:  No results found.   RADIOGRAPHIC STUDIES: 1. Digital diagnostic bilateral mammogram from 11/29/2010 showed no evidence of malignancy in either breast.  2. Chest x-ray, 2 view, from 07/07/2011 shows stable chronic and postoperative changes.  3. Digital diagnostic left mammogram on 09/12/2011: Showed findings highly suspicious for DCIS in the sub areola left breast and left nipple. There were new suspicious linearly oriented calcifications in the sub areola, central and outer left breast that spanned approximately 3.2 cm in maximum diameter. Calcifications extended to the nipple which is now retracted. This is a new finding. The nipple also looked thickened  in appearance. There  was no mass associated with the left breast. Skin punch biopsy of the left nipple was suggested.  4. PET scan from 10/06/2011 showed some mild hypermetabolism about the left breast skin, but there was no evidence for hypermetabolic metastatic disease. There was noted to be some hypermetabolism in the right pelvic region felt to be most likely physiologic. There was an incidental left-sided renal calculus and prominent collateral veins in the right lower extremity.  5. Unilateral right digital screening mammogram carried out on 03/16/2012 showed no evidence of malignancy.  ASSESSMENT: Judy [name] 80 y.o. female with a history of Breast cancer, left - Plan: CBC with Differential, Comprehensive metabolic panel (Cmet) - [facility], Lactate dehydrogenase (LDH) - [facility]  DCIS (ductal carcinoma in situ) of breast  Cancer of breast, intraductal  HTN (hypertension)   PLAN:   1. Recurrent breast cancer. Judy [name] seems to be doing well now over a year  from the diagnosis of her microscopic focus of invasive breast cancer. She is on no therapy at this time.   2. Hypokalemia. --Patient is aware and is on Potassium chloride 10 mEq by mouth bid. Low potassium is likely secondary to HCTZ.   3. Follow-up We will plan to see Judy [name] again in about 6 months, at which time we will check CBC and chemistries and repeat mammogram 02/2013 .  All questions were answered. The patient knows to call the clinic with any problems, questions or concerns. We can certainly see the patient much sooner if necessary.  I spent 10 minutes counseling the patient face to face. The total time spent in the appointment was 15 minutes.    Tawana Pasch, MD 03/12/2013 5:44 AM

## 2013-03-18 ENCOUNTER — Ambulatory Visit
Admission: RE | Admit: 2013-03-18 | Discharge: 2013-03-18 | Disposition: A | Discharge status: Home / Self Care | Payer: Self-pay | Source: Ambulatory Visit | Attending: Hematology and Oncology | Admitting: Hematology and Oncology

## 2013-03-18 DIAGNOSIS — D0510 Intraductal carcinoma in situ of unspecified breast: Secondary | ICD-10-CM

## 2013-03-18 DIAGNOSIS — C50912 Malignant neoplasm of unspecified site of left female breast: Secondary | ICD-10-CM

## 2013-05-30 ENCOUNTER — Encounter: Payer: Self-pay | Admitting: Interventional Cardiology

## 2013-09-06 ENCOUNTER — Telehealth: Payer: Self-pay | Admitting: Internal Medicine

## 2013-09-06 ENCOUNTER — Other Ambulatory Visit (HOSPITAL_BASED_OUTPATIENT_CLINIC_OR_DEPARTMENT_OTHER): Payer: Medicare Other

## 2013-09-06 ENCOUNTER — Ambulatory Visit (HOSPITAL_BASED_OUTPATIENT_CLINIC_OR_DEPARTMENT_OTHER): Payer: Medicare Other | Admitting: Internal Medicine

## 2013-09-06 VITALS — BP 167/72 | HR 57 | Temp 97.6°F | Resp 17 | Ht 62.0 in | Wt 169.5 lb

## 2013-09-06 DIAGNOSIS — E876 Hypokalemia: Secondary | ICD-10-CM

## 2013-09-06 DIAGNOSIS — Z853 Personal history of malignant neoplasm of breast: Secondary | ICD-10-CM

## 2013-09-06 DIAGNOSIS — C50912 Malignant neoplasm of unspecified site of left female breast: Secondary | ICD-10-CM

## 2013-09-06 LAB — COMPREHENSIVE METABOLIC PANEL (CC13)
ALT: 15 U/L (ref 0–55)
AST: 23 U/L (ref 5–34)
Albumin: 3.9 g/dL (ref 3.5–5.0)
Alkaline Phosphatase: 69 U/L (ref 40–150)
Anion Gap: 8 mEq/L (ref 3–11)
BUN: 12.5 mg/dL (ref 7.0–26.0)
CO2: 31 mEq/L — ABNORMAL HIGH (ref 22–29)
Calcium: 9.8 mg/dL (ref 8.4–10.4)
Chloride: 103 mEq/L (ref 98–109)
Creatinine: 0.9 mg/dL (ref 0.6–1.1)
Glucose: 133 mg/dl (ref 70–140)
Potassium: 3.2 mEq/L — ABNORMAL LOW (ref 3.5–5.1)
Sodium: 142 mEq/L (ref 136–145)
Total Bilirubin: 0.38 mg/dL (ref 0.20–1.20)
Total Protein: 7.2 g/dL (ref 6.4–8.3)

## 2013-09-06 LAB — CBC WITH DIFFERENTIAL/PLATELET
BASO%: 1.3 % (ref 0.0–2.0)
Basophils Absolute: 0.1 10*3/uL (ref 0.0–0.1)
EOS%: 8.6 % — ABNORMAL HIGH (ref 0.0–7.0)
Eosinophils Absolute: 0.5 10*3/uL (ref 0.0–0.5)
HCT: 38 % (ref 34.8–46.6)
HGB: 12.5 g/dL (ref 11.6–15.9)
LYMPH%: 31.4 % (ref 14.0–49.7)
MCH: 29.9 pg (ref 25.1–34.0)
MCHC: 32.9 g/dL (ref 31.5–36.0)
MCV: 90.7 fL (ref 79.5–101.0)
MONO#: 0.6 10*3/uL (ref 0.1–0.9)
MONO%: 10.9 % (ref 0.0–14.0)
NEUT#: 2.5 10*3/uL (ref 1.5–6.5)
NEUT%: 47.8 % (ref 38.4–76.8)
Platelets: 257 10*3/uL (ref 145–400)
RBC: 4.19 10*6/uL (ref 3.70–5.45)
RDW: 13.8 % (ref 11.2–14.5)
WBC: 5.3 10*3/uL (ref 3.9–10.3)
lymph#: 1.7 10*3/uL (ref 0.9–3.3)

## 2013-09-06 LAB — LACTATE DEHYDROGENASE (CC13): LDH: 398 U/L — ABNORMAL HIGH (ref 125–245)

## 2013-09-06 NOTE — Telephone Encounter (Signed)
gv adn printed appt sched and avs for pt for Jan 2016 °

## 2013-09-06 NOTE — Patient Instructions (Signed)
Potassium Content of Foods  Potassium is a mineral found in many foods and drinks. It helps keep fluids and minerals balanced in your body and affects how steadily your heart beats. Potassium also helps control your blood pressure and keep your muscles and nervous system healthy.  Certain health conditions and medicines may change the balance of potassium in your body. When this happens, you can help balance your level of potassium through the foods that you do or do not eat. Your health care provider or dietitian may recommend an amount of potassium that you should have each day. The following lists of foods provide the amount of potassium (in parentheses) per serving in each item.  HIGH IN POTASSIUM   The following foods and beverages have 200 mg or more of potassium per serving:  · Apricots, 2 raw or 5 dry (200 mg).  · Artichoke, 1 medium (345 mg).  · Avocado, raw,  ¼ each (245 mg).  · Banana, 1 medium (425 mg).  · Beans, lima, or baked beans, canned, ½ cup (280 mg).  · Beans, white, canned, ½ cup (595 mg).  · Beef roast, 3 oz (320 mg).  · Beef, ground, 3 oz (270 mg).  · Beets, raw or cooked, ½ cup (260 mg).  · Bran muffin, 2 oz (300 mg).  · Broccoli, ½ cup (230 mg).  · Brussels sprouts, ½ cup (250 mg).  · Cantaloupe, ½ cup (215 mg).  · Cereal, 100% bran, ½ cup (200-400 mg).  · Cheeseburger, single, fast food, 1 each (225-400 mg).  · Chicken, 3 oz (220 mg).  · Clams, canned, 3 oz (535 mg).  · Crab, 3 oz (225 mg).  · Dates, 5 each (270 mg).  · Dried beans and peas, ½ cup (300-475 mg).  · Figs, dried, 2 each (260 mg).  · Fish: halibut, tuna, cod, snapper, 3 oz (480 mg).  · Fish: salmon, haddock, swordfish, perch, 3 oz (300 mg).  · Fish, tuna, canned 3 oz (200 mg).  · French fries, fast food, 3 oz (470 mg).  · Granola with fruit and nuts, ½ cup (200 mg).  · Grapefruit juice, ½ cup (200 mg).  · Greens, beet, ½ cup (655 mg).  · Honeydew melon, ½ cup (200 mg).  · Kale, raw, 1 cup (300 mg).  · Kiwi, 1 medium (240  mg).  · Kohlrabi, rutabaga, parsnips, ½ cup (280 mg).  · Lentils, ½ cup (365 mg).  · Mango, 1 each (325 mg).  · Milk, chocolate, 1 cup (420 mg).  · Milk: nonfat, low-fat, whole, buttermilk, 1 cup (350-380 mg).  · Molasses, 1 Tbsp (295 mg).  · Mushrooms, ½ cup (280) mg.  · Nectarine, 1 each (275 mg).  · Nuts: almonds, peanuts, hazelnuts, [place], cashew, mixed, 1 oz (200 mg).  · Nuts, pistachios, 1 oz (295 mg).  · Orange, 1 each (240 mg).  · Orange juice, ½ cup (235 mg).  · Papaya, medium, ½ fruit (390 mg).  · Peanut butter, chunky, 2 Tbsp (240 mg).  · Peanut butter, smooth, 2 Tbsp (210 mg).  · Pear, 1 medium (200 mg).  · Pomegranate, 1 whole (400 mg).  · Pomegranate juice, ½ cup (215 mg).  · Pork, 3 oz (350 mg).  · Potato chips, salted, 1 oz (465 mg).  · Potato, baked with skin, 1 medium (925 mg).  · Potatoes, boiled, ½ cup (255 mg).  · Potatoes, mashed, ½ cup (330 mg).  · Prune juice, ½ cup (  370 mg).  · Prunes, 5 each (305 mg).  · Pudding, chocolate, ½ cup (230 mg).  · Pumpkin, canned, ½ cup (250 mg).  · Raisins, seedless, ¼ cup (270 mg).  · Seeds, sunflower or pumpkin, 1 oz (240 mg).  · Soy milk, 1 cup (300 mg).  · Spinach, ½ cup (420 mg).  · Spinach, canned, ½ cup (370 mg).  · Sweet potato, baked with skin, 1 medium (450 mg).  · Swiss chard, ½ cup (480 mg).  · Tomato or vegetable juice, ½ cup (275 mg).  · Tomato sauce or puree, ½ cup (400-550 mg).  · Tomato, raw, 1 medium (290 mg).  · Tomatoes, canned, ½ cup (200-300 mg).  · Turkey, 3 oz (250 mg).  · Wheat germ, 1 oz (250 mg).  · Winter squash, ½ cup (250 mg).  · Yogurt, plain or fruited, 6 oz (260-435 mg).  · Zucchini, ½ cup (220 mg).  MODERATE IN POTASSIUM  The following foods and beverages have 50-200 mg of potassium per serving:  · Apple, 1 each (150 mg).  · Apple juice, ½ cup (150 mg).  · Applesauce, ½ cup (90 mg).  · Apricot nectar, ½ cup (140 mg).  · Asparagus, small spears, ½ cup or 6 spears (155 mg).  · Bagel, cinnamon raisin, 1 each (130 mg).  · Bagel,  egg or plain, 4 in., 1 each (70 mg).  · Beans, green, ½ cup (90 mg).  · Beans, yellow, ½ cup (190 mg).  · Beer, regular, 12 oz (100 mg).  · Beets, canned, ½ cup (125 mg).  · Blackberries, ½ cup (115 mg).  · Blueberries, ½ cup (60 mg).  · Bread, whole wheat, 1 slice (70 mg).  · Broccoli, raw, ½ cup (145 mg).  · Cabbage, ½ cup (150 mg).  · Carrots, cooked or raw, ½ cup (180 mg).  · Cauliflower, raw, ½ cup (150 mg).  · Celery, raw, ½ cup (155 mg).  · Cereal, bran flakes, ½cup (120-150 mg).  · Cheese, cottage, ½ cup (110 mg).  · Cherries, 10 each (150 mg).  · Chocolate, 1½ oz bar (165 mg).  · Coffee, brewed 6 oz (90 mg).  · Corn, ½ cup or 1 ear (195 mg).  · Cucumbers, ½ cup (80 mg).  · Egg, large, 1 each (60 mg).  · Eggplant, ½ cup (60 mg).  · Endive, raw, ½cup (80 mg).  · English muffin, 1 each (65 mg).  · Fish, orange roughy, 3 oz (150 mg).  · Frankfurter, beef or pork, 1 each (75 mg).  · Fruit cocktail, ½ cup (115 mg).  · Grape juice, ½ cup (170 mg).  · Grapefruit, ½ fruit (175 mg).  · Grapes, ½ cup (155 mg).  · Greens: kale, turnip, collard, ½ cup (110-150 mg).  · Ice cream or frozen yogurt, chocolate, ½ cup (175 mg).  · Ice cream or frozen yogurt, vanilla, ½ cup (120-150 mg).  · Lemons, limes, 1 each (80 mg).  · Lettuce, all types, 1 cup (100 mg).  · Mixed vegetables, ½ cup (150 mg).  · Mushrooms, raw, ½ cup (110 mg).  · Nuts: walnuts, pecans, or macadamia, 1 oz (125 mg).  · Oatmeal, ½ cup (80 mg).  · Okra, ½ cup (110 mg).  · Onions, raw, ½ cup (120 mg).  · Peach, 1 each (185 mg).  · Peaches, canned, ½ cup (120 mg).  · Pears, canned, ½ cup (120 mg).  · Peas, green,   frozen, ½ cup (90 mg).  · Peppers, green, ½ cup (130 mg).  · Peppers, red, ½ cup (160 mg).  · Pineapple juice, ½ cup (165 mg).  · Pineapple, fresh or canned, ½ cup (100 mg).  · Plums, 1 each (105 mg).  · Pudding, vanilla, ½ cup (150 mg).  · Raspberries, ½ cup (90 mg).  · Rhubarb, ½ cup (115 mg).  · Rice, wild, ½ cup (80 mg).  · Shrimp, 3 oz (155  mg).  · Spinach, raw, 1 cup (170 mg).  · Strawberries, ½ cup (125 mg).  · Summer squash ½ cup (175-200 mg).  · Swiss chard, raw, 1 cup (135 mg).  · Tangerines, 1 each (140 mg).  · Tea, brewed, 6 oz (65 mg).  · Turnips, ½ cup (140 mg).  · Watermelon, ½ cup (85 mg).  · Wine, red, table, 5 oz (180 mg).  · Wine, white, table, 5 oz (100 mg).  LOW IN POTASSIUM  The following foods and beverages have less than 50 mg of potassium per serving.  · Bread, white, 1 slice (30 mg).  · Carbonated beverages, 12 oz (less than 5 mg).  · Cheese, 1 oz (20-30 mg).  · Cranberries, ½ cup (45 mg).  · Cranberry juice cocktail, ½ cup (20 mg).  · Fats and oils, 1 Tbsp (less than 5 mg).  · Hummus, 1 Tbsp (32 mg).  · Nectar: papaya, mango, or pear, ½ cup (35 mg).  · Rice, white or brown, ½ cup (50 mg).  · Spaghetti or macaroni, ½ cup cooked (30 mg).  · Tortilla, flour or corn, 1 each (50 mg).  · Waffle, 4 in., 1 each (50 mg).  · Water chestnuts, ½ cup (40 mg).  Document Released: 09/14/2004 Document Revised: 02/05/2013 Document Reviewed: 12/28/2012  [employer]® Patient Information ©2015 [employer]. This information is not intended to replace advice given to you by your health care provider. Make sure you discuss any questions you have with your health care provider.

## 2013-09-06 NOTE — Progress Notes (Signed)
[facility] OFFICE PROGRESS NOTE  SHAW,KIMBERLEE, MD [address] [place] [address]  DIAGNOSIS: Breast cancer, left - Plan: CBC with Differential, Basic metabolic panel (Bmet) - [facility], Lactate dehydrogenase (LDH) - [facility]  Chief Complaint  Patient presents with  . Breast cancer, left    CURRENT THERAPY: Observation.   INTERVAL HISTORY: Judy [name] 80 y.o. female with a history of recurrent cancer  involving the left breast is here for follow-up.  She was last seen by me on 03/11/2013.  The patient originally had DCIS and subsequently developed microscopic invasive carcinoma involving the nipple. Estrogen and progesterone receptors have been negative in the past. HER2/neu on the punch biopsies carried out on 09/16/2011 were positive with a ratio of 4.68. The patient underwent a simple mastectomy of the left breast with sentinel node sampling on 10/25/2011. She has recovered from the surgery. We decided that in view of the microscopic focus which measured less than a 10th of a millimeter that the patient did not require any additional treatment. The patient seems  to be doing well. She is without complaints today.   MEDICAL HISTORY: Past Medical History  Diagnosis Date  . Cancer of breast, intraductal   . GERD (gastroesophageal reflux disease)   . HTN (hypertension)   . Dyslipidemia   . Blood transfusion   . Cancer   . Breast cancer, left 09/26/2011    Punch biopsies of the left nipple done on 09/16/2011.  ER and PR were negative.  HER2 is positive with ratio 4.68.Had mastectomy on 10/25/11. Path T1,N0.   Marland Kitchen Hyperlipidemia   . Osteoarthritis   . Chronic low back pain   . Osteoporosis     INTERIM HISTORY: has DCIS (ductal carcinoma in situ) of breast; Breast cancer, left; Cancer of breast, intraductal; HTN (hypertension); and Dyslipidemia on her problem list.    ALLERGIES:  is allergic to amoxicillin; codeine; amlodipine; aricept; clonidine  derivatives; and sertraline.  MEDICATIONS: has a current medication list which includes the following prescription(s): amlodipine, cyclobenzaprine, hydrochlorothiazide, lisinopril, metoprolol succinate, mirabegron er, multivitamin, nabumetone, potassium chloride, psyllium, and simvastatin.  SURGICAL HISTORY:  Past Surgical History  Procedure Laterality Date  . Rotator cuff repair      Bilateral, 2010  . Total abdominal hysterectomy    . Breast surgery  2009    lt lumpectomy-plus 2 re-excisions for margins  . Back surgery  2009    lumb lam  . Left breast mastectomy  2013   PROBLEM LIST:  1. Ductal carcinoma in situ, focal high-grade involving the left  breast, left upper quadrant, less than 0.5 cm. Estrogen and  progesterone receptor negative, diagnosed by needle biopsy on  11/23/2006 with needle localization biopsy on 12/25/2006. The  patient was lost to followup, although the plan was for her to have  radiation treatments at that time. When the patient reappeared about a year later, she was found to have recurrent DCIS in October 2009. She underwent several excisions with negative margins by 01/16/2008. She underwent radiation treatments to the breast from 03/03/2008 through 03/27/2008.  2. Infiltrating carcinoma involving the left breast, associated with  Paget's disease. Punch biopsies of the nipple area were obtained by Dr. Osborn Coho on 09/16/2011. There were infiltrating strands in small clusters of atypical cells within the dermis. The overlying epidermis was ulcerated or detached and replaced by an inflamed crust. Estrogen and progesterone receptors are negative. HER- 2/neu by [facility] shows amplification with a ratio of 4.68.  PET scan carried out on 10/06/2011 shows no evidence for hypermetabolic metastatic disease. Judy [name] underwent a simple mastectomy with sentinel lymph node biopsy on 10/25/2011 by Dr. Osborn Coho. She was found to have microscopic foci, less than 0.1 mm, arising  from high-grade DCIS involving the nipple, grade 3/3 (Paget's disease.) All 3 sentinel lymph nodes were free of tumor. Resection margins were negative. Extensive intraductal component was absent. Tumor stage was felt to be best characterized as  pTmi N0 M0.  3. Hypertension.  4. Dyslipidemia.  5. Diverticulosis of the sigmoid colon.  6. Gastroesophageal reflux disease.  7. Mild leukopenia.  REVIEW OF SYSTEMS:   Constitutional: Denies fevers, chills or abnormal weight loss Eyes: Denies blurriness of vision Ears, nose, mouth, throat, and face: Denies mucositis or sore throat Respiratory: Denies cough, dyspnea or wheezes Cardiovascular: Denies palpitation, chest discomfort or lower extremity swelling Gastrointestinal:  Denies nausea, heartburn or change in bowel habits Skin: Denies abnormal skin rashes Lymphatics: Denies new lymphadenopathy or easy bruising Neurological:Denies numbness, tingling or new weaknesses Behavioral/Psych: Mood is stable, no new changes  All other systems were reviewed with the patient and are negative.  PHYSICAL EXAMINATION: ECOG PERFORMANCE STATUS: 0 - Asymptomatic  Blood pressure 167/72, pulse 57, temperature 97.6 F (36.4 C), temperature source Oral, resp. rate 17, height '5\' 2"'  (1.575 m), weight 169 lb 8 oz (76.885 kg), SpO2 100.00%.  GENERAL:alert, no distress and comfortable SKIN: skin color, texture, turgor are normal, no rashes or significant lesions EYES: normal, Conjunctiva are pink and non-injected, sclera clear OROPHARYNX:no exudate, no erythema and lips, buccal mucosa, and tongue normal  NECK: supple, thyroid normal size, non-tender, without nodularity LYMPH:  no palpable lymphadenopathy in the cervical, axillary or supraclavicular LUNGS: clear to auscultation and percussion with normal breathing effort Breasts: Right breast was benign. Left breast is surgically absent  with a well-healed incision. No evidence for chest wall recurrence. HEART:  regular rate & rhythm and no murmurs and no lower extremity edema ABDOMEN:abdomen soft, non-tender and normal bowel sounds Musculoskeletal:no cyanosis of digits and no clubbing  NEURO: alert & oriented x 3 with fluent speech, no focal motor/sensory deficits   LABORATORY DATA: Results for orders placed in visit on 09/06/13 (from the past 48 hour(s))  CBC WITH DIFFERENTIAL     Status: Abnormal   Collection Time    09/06/13  3:11 PM      Result Value Ref Range   WBC 5.3  3.9 - 10.3 10e3/uL   NEUT# 2.5  1.5 - 6.5 10e3/uL   HGB 12.5  11.6 - 15.9 g/dL   HCT 38.0  34.8 - 46.6 %   Platelets 257  145 - 400 10e3/uL   MCV 90.7  79.5 - 101.0 fL   MCH 29.9  25.1 - 34.0 pg   MCHC 32.9  31.5 - 36.0 g/dL   RBC 4.19  3.70 - 5.45 10e6/uL   RDW 13.8  11.2 - 14.5 %   lymph# 1.7  0.9 - 3.3 10e3/uL   MONO# 0.6  0.1 - 0.9 10e3/uL   Eosinophils Absolute 0.5  0.0 - 0.5 10e3/uL   Basophils Absolute 0.1  0.0 - 0.1 10e3/uL   NEUT% 47.8  38.4 - 76.8 %   LYMPH% 31.4  14.0 - 49.7 %   MONO% 10.9  0.0 - 14.0 %   EOS% 8.6 (*) 0.0 - 7.0 %   BASO% 1.3  0.0 - 2.0 %  COMPREHENSIVE METABOLIC PANEL (ZY60)     Status: Abnormal  Collection Time    09/06/13  3:11 PM      Result Value Ref Range   Sodium 142  136 - 145 mEq/L   Potassium 3.2 (*) 3.5 - 5.1 mEq/L   Chloride 103  98 - 109 mEq/L   CO2 31 (*) 22 - 29 mEq/L   Glucose 133  70 - 140 mg/dl   BUN 12.5  7.0 - 26.0 mg/dL   Creatinine 0.9  0.6 - 1.1 mg/dL   Total Bilirubin 0.38  0.20 - 1.20 mg/dL   Alkaline Phosphatase 69  40 - 150 U/L   AST 23  5 - 34 U/L   ALT 15  0 - 55 U/L   Total Protein 7.2  6.4 - 8.3 g/dL   Albumin 3.9  3.5 - 5.0 g/dL   Calcium 9.8  8.4 - 10.4 mg/dL   Anion Gap 8  3 - 11 mEq/L  LACTATE DEHYDROGENASE (CC13)     Status: Abnormal   Collection Time    09/06/13  3:11 PM      Result Value Ref Range   LDH 398 (*) 125 - 245 U/L       Labs:  Lab Results  Component Value Date   WBC 5.3 09/06/2013   HGB 12.5 09/06/2013   HCT 38.0  09/06/2013   MCV 90.7 09/06/2013   PLT 257 09/06/2013   NEUTROABS 2.5 09/06/2013      Chemistry      Component Value Date/Time   NA 142 09/06/2013 1511   NA 142 10/21/2011 0900   K 3.2* 09/06/2013 1511   K 3.3* 10/21/2011 0900   CL 100 06/07/2012 1525   CL 101 10/21/2011 0900   CO2 31* 09/06/2013 1511   CO2 33* 10/21/2011 0900   BUN 12.5 09/06/2013 1511   BUN 9 10/21/2011 0900   CREATININE 0.9 09/06/2013 1511   CREATININE 0.80 10/21/2011 0900      Component Value Date/Time   CALCIUM 9.8 09/06/2013 1511   CALCIUM 9.9 10/21/2011 0900   ALKPHOS 69 09/06/2013 1511   ALKPHOS 61 09/12/2011 0833   AST 23 09/06/2013 1511   AST 20 09/12/2011 0833   ALT 15 09/06/2013 1511   ALT 13 09/12/2011 0833   BILITOT 0.38 09/06/2013 1511   BILITOT 0.5 09/12/2011 0833     Basic Metabolic Panel:  Recent Labs Lab 09/06/13 1511  NA 142  K 3.2*  CO2 31*  GLUCOSE 133  BUN 12.5  CREATININE 0.9  CALCIUM 9.8   GFR Estimated Creatinine Clearance: 47.1 ml/min (by C-G formula based on Cr of 0.9). Liver Function Tests:  Recent Labs Lab 09/06/13 1511  AST 23  ALT 15  ALKPHOS 69  BILITOT 0.38  PROT 7.2  ALBUMIN 3.9    CBC:  Recent Labs Lab 09/06/13 1511  WBC 5.3  NEUTROABS 2.5  HGB 12.5  HCT 38.0  MCV 90.7  PLT 257   Studies:  No results found.   RADIOGRAPHIC STUDIES: 1. Digital diagnostic bilateral mammogram from 11/29/2010 showed no evidence of malignancy in either breast.  2. Chest x-ray, 2 view, from 07/07/2011 shows stable chronic and postoperative changes.  3. Digital diagnostic left mammogram on 09/12/2011: Showed findings highly suspicious for DCIS in the sub areola left breast and left nipple. There were new suspicious linearly oriented calcifications in the sub areola, central and outer left breast that spanned approximately 3.2 cm in maximum diameter. Calcifications extended to the nipple which is now retracted. This is a new  finding. The nipple also looked thickened in appearance. There was  no mass associated with the left breast. Skin punch biopsy of the left nipple was suggested.  4. PET scan from 10/06/2011 showed some mild hypermetabolism about the left breast skin, but there was no evidence for hypermetabolic metastatic disease. There was noted to be some hypermetabolism in the right pelvic region felt to be most likely physiologic. There was an incidental left-sided renal calculus and prominent collateral veins in the right lower extremity.  5. Unilateral right digital screening mammogram carried out on 03/16/2012 showed no evidence of malignancy.  ASSESSMENT: Judy [name] 80 y.o. female with a history of Breast cancer, left - Plan: CBC with Differential, Basic metabolic panel (Bmet) - [facility], Lactate dehydrogenase (LDH) - [facility]   PLAN:   1. Recurrent breast cancer. Judy [name] seems to be doing well now nearly 2 years from the diagnosis of her microscopic focus of invasive breast cancer. She is on no therapy at this time.   2. Hypokalemia. --Patient is aware and is on Potassium chloride 10 mEq by mouth bid. Low potassium is likely secondary to HCTZ. She was provided a handout on potassium rich diet.   3. Follow-up We will plan to see Judy [name] again in about 6 months, at which time we will check CBC and chemistries and repeat mammogram 02/2013 .  All questions were answered. The patient knows to call the clinic with any problems, questions or concerns. We can certainly see the patient much sooner if necessary.  I spent 10 minutes counseling the patient face to face. The total time spent in the appointment was 15 minutes.    Judy Vanwagoner, MD 09/07/2013 12:43 PM

## 2013-09-07 DIAGNOSIS — E876 Hypokalemia: Secondary | ICD-10-CM | POA: Insufficient documentation

## 2014-02-12 ENCOUNTER — Telehealth: Payer: Self-pay | Admitting: Hematology and Oncology

## 2014-02-12 NOTE — Telephone Encounter (Signed)
, °

## 2014-02-19 ENCOUNTER — Other Ambulatory Visit: Payer: Self-pay

## 2014-02-19 DIAGNOSIS — Z1231 Encounter for screening mammogram for malignant neoplasm of breast: Secondary | ICD-10-CM

## 2014-02-19 DIAGNOSIS — Z9012 Acquired absence of left breast and nipple: Secondary | ICD-10-CM

## 2014-02-19 DIAGNOSIS — Z853 Personal history of malignant neoplasm of breast: Secondary | ICD-10-CM

## 2014-03-07 ENCOUNTER — Other Ambulatory Visit: Payer: Medicare Other

## 2014-03-07 ENCOUNTER — Ambulatory Visit: Payer: Medicare Other

## 2014-03-18 ENCOUNTER — Other Ambulatory Visit: Payer: Self-pay | Admitting: *Deleted

## 2014-03-18 DIAGNOSIS — C50912 Malignant neoplasm of unspecified site of left female breast: Secondary | ICD-10-CM

## 2014-03-19 ENCOUNTER — Ambulatory Visit
Admission: RE | Admit: 2014-03-19 | Discharge: 2014-03-19 | Disposition: A | Discharge status: Home / Self Care | Payer: Medicare Other | Source: Ambulatory Visit

## 2014-03-19 ENCOUNTER — Other Ambulatory Visit (HOSPITAL_BASED_OUTPATIENT_CLINIC_OR_DEPARTMENT_OTHER): Payer: Medicare Other

## 2014-03-19 ENCOUNTER — Ambulatory Visit (HOSPITAL_BASED_OUTPATIENT_CLINIC_OR_DEPARTMENT_OTHER): Payer: Medicare Other | Admitting: Hematology and Oncology

## 2014-03-19 VITALS — BP 154/71 | HR 63 | Temp 97.6°F | Resp 18 | Ht 62.0 in | Wt 169.4 lb

## 2014-03-19 DIAGNOSIS — Z853 Personal history of malignant neoplasm of breast: Secondary | ICD-10-CM

## 2014-03-19 DIAGNOSIS — Z9012 Acquired absence of left breast and nipple: Secondary | ICD-10-CM

## 2014-03-19 DIAGNOSIS — C50912 Malignant neoplasm of unspecified site of left female breast: Secondary | ICD-10-CM

## 2014-03-19 DIAGNOSIS — Z1231 Encounter for screening mammogram for malignant neoplasm of breast: Secondary | ICD-10-CM

## 2014-03-19 LAB — CBC WITH DIFFERENTIAL/PLATELET
BASO%: 1.3 % (ref 0.0–2.0)
Basophils Absolute: 0.1 10*3/uL (ref 0.0–0.1)
EOS%: 3.8 % (ref 0.0–7.0)
Eosinophils Absolute: 0.2 10*3/uL (ref 0.0–0.5)
HCT: 38 % (ref 34.8–46.6)
HGB: 12.6 g/dL (ref 11.6–15.9)
LYMPH%: 28.7 % (ref 14.0–49.7)
MCH: 30.1 pg (ref 25.1–34.0)
MCHC: 33.3 g/dL (ref 31.5–36.0)
MCV: 90.5 fL (ref 79.5–101.0)
MONO#: 0.7 10*3/uL (ref 0.1–0.9)
MONO%: 12.3 % (ref 0.0–14.0)
NEUT#: 2.9 10*3/uL (ref 1.5–6.5)
NEUT%: 53.9 % (ref 38.4–76.8)
Platelets: 251 10*3/uL (ref 145–400)
RBC: 4.2 10*6/uL (ref 3.70–5.45)
RDW: 14 % (ref 11.2–14.5)
WBC: 5.3 10*3/uL (ref 3.9–10.3)
lymph#: 1.5 10*3/uL (ref 0.9–3.3)

## 2014-03-19 LAB — COMPREHENSIVE METABOLIC PANEL (CC13)
ALT: 18 U/L (ref 0–55)
AST: 25 U/L (ref 5–34)
Albumin: 3.8 g/dL (ref 3.5–5.0)
Alkaline Phosphatase: 69 U/L (ref 40–150)
Anion Gap: 9 mEq/L (ref 3–11)
BUN: 11.7 mg/dL (ref 7.0–26.0)
CO2: 33 mEq/L — ABNORMAL HIGH (ref 22–29)
Calcium: 9.3 mg/dL (ref 8.4–10.4)
Chloride: 101 mEq/L (ref 98–109)
Creatinine: 0.9 mg/dL (ref 0.6–1.1)
EGFR: 66 mL/min/{1.73_m2} — ABNORMAL LOW (ref >90)
Glucose: 99 mg/dl (ref 70–140)
Potassium: 3.4 mEq/L — ABNORMAL LOW (ref 3.5–5.1)
Sodium: 143 mEq/L (ref 136–145)
Total Bilirubin: 0.31 mg/dL (ref 0.20–1.20)
Total Protein: 7 g/dL (ref 6.4–8.3)

## 2014-03-19 NOTE — Assessment & Plan Note (Addendum)
Left breast microscopic invasive ductal carcinoma ER PR 0%, HER-2 positive ratio 4.68 status post left breast mastectomy 10/25/2011, T1 N0 M0 stage IA  Breast cancer surveillance: Breast exam and mammogram done 03/19/2014 are normal Patient wanted to follow-up with her primary care physician for her breast examination and breast cancer surveillance. I felt it was reasonable to do so. If there was ever any further problems she can call us and we can see her on an as-needed basis.

## 2014-03-19 NOTE — Progress Notes (Signed)
Patient Care Team: Mayra Neer, MD as PCP - General (Family Medicine)  DIAGNOSIS: No matching staging information was found for the patient.  SUMMARY OF ONCOLOGIC HISTORY:   Breast cancer, left   09/16/2011 Initial Biopsy skin left nipple biopsy: Infiltrating carcinoma associated with Paget's disease, ER/PR negative, HER-2 positive ratio 4.68   10/25/2011 Surgery  left breast mastectomy: Microscopic focus of invasive carcinoma arising from in situ carcinoma involving the nipple Paget's disease, high-grade DCIS with comedonecrosis, 3 sentinel nodes negative, ER 0% PR 0% HER-2 negative    CHIEF COMPLIANT: follow-up of breast cancer  INTERVAL HISTORY: Judy [name] is a 81 year old lady with above-mentioned history of left-sided breast cancer treated with mastectomy and was found to have a small focus of invasive carcinoma involving the nipple Paget's disease. She had high-grade DCIS with comedonecrosis. She was ER/PR negative and was being watched under surveillance. She has no new problems or concerns. She had a mammogram earlier today which was normal.  REVIEW OF SYSTEMS:   Constitutional: Denies fevers, chills or abnormal weight loss Eyes: Denies blurriness of vision Ears, nose, mouth, throat, and face: Denies mucositis or sore throat Respiratory: Denies cough, dyspnea or wheezes Cardiovascular: Denies palpitation, chest discomfort or lower extremity swelling Gastrointestinal:  Denies nausea, heartburn or change in bowel habits Skin: Denies abnormal skin rashes Lymphatics: Denies new lymphadenopathy or easy bruising Neurological:Denies numbness, tingling or new weaknesses Behavioral/Psych: Mood is stable, no new changes  Breast:  denies any pain or lumps or nodules in either breasts, left chest wall without any nodules or lumps All other systems were reviewed with the patient and are negative.  I have reviewed the past medical history, past surgical history, social history and family  history with the patient and they are unchanged from previous note.  ALLERGIES:  is allergic to amoxicillin; codeine; amlodipine; aricept; clonidine derivatives; and sertraline.  MEDICATIONS:  Current Outpatient Prescriptions  Medication Sig Dispense Refill  . amLODipine (NORVASC) 5 MG tablet Take 1 tablet (5 mg total) by mouth daily.    . cyclobenzaprine (FLEXERIL) 10 MG tablet Take 10 mg by mouth 3 (three) times daily as needed for muscle spasms.    . hydrochlorothiazide (HYDRODIURIL) 25 MG tablet Take 25 mg by mouth daily.    Marland Kitchen lisinopril (PRINIVIL,ZESTRIL) 40 MG tablet Take 40 mg by mouth daily.    . metoprolol succinate (TOPROL-XL) 100 MG 24 hr tablet Take 100 mg by mouth daily. Take with or immediately following a meal.    . Multiple Vitamin (MULTIVITAMIN) capsule Take 1 capsule by mouth daily.    . nabumetone (RELAFEN) 500 MG tablet Take 500 mg by mouth 2 (two) times daily as needed for pain.    . potassium chloride (K-DUR,KLOR-CON) 10 MEQ tablet Take 10 mEq by mouth 2 (two) times daily.    . psyllium (METAMUCIL SMOOTH TEXTURE) 28 % packet Take 1 packet by mouth 2 (two) times daily.    . simvastatin (ZOCOR) 20 MG tablet Take 20 mg by mouth every evening.     No current facility-administered medications for this visit.    PHYSICAL EXAMINATION: ECOG PERFORMANCE STATUS: 0 - Asymptomatic  Filed Vitals:   03/19/14 1510  BP: 154/71  Pulse: 63  Temp: 97.6 F (36.4 C)  Resp: 18   Filed Weights   03/19/14 1510  Weight: 169 lb 6.4 oz (76.839 kg)    GENERAL:alert, no distress and comfortable SKIN: skin color, texture, turgor are normal, no rashes or significant lesions EYES: normal, Conjunctiva  are pink and non-injected, sclera clear OROPHARYNX:no exudate, no erythema and lips, buccal mucosa, and tongue normal  NECK: supple, thyroid normal size, non-tender, without nodularity LYMPH:  no palpable lymphadenopathy in the cervical, axillary or inguinal LUNGS: clear to auscultation  and percussion with normal breathing effort HEART: regular rate & rhythm and no murmurs and no lower extremity edema ABDOMEN:abdomen soft, non-tender and normal bowel sounds Musculoskeletal:no cyanosis of digits and no clubbing  NEURO: alert & oriented x 3 with fluent speech, no focal motor/sensory deficits BREAST: No palpable masses or nodules in right breast, left chest wall is without any nodules or abnormalities.. No palpable axillary supraclavicular or infraclavicular adenopathy no breast tenderness or nipple discharge. (exam performed in the presence of a chaperone)  LABORATORY DATA:  I have reviewed the data as listed   Chemistry      Component Value Date/Time   NA 143 03/19/2014 1443   NA 142 10/21/2011 0900   K 3.4* 03/19/2014 1443   K 3.3* 10/21/2011 0900   CL 100 06/07/2012 1525   CL 101 10/21/2011 0900   CO2 33* 03/19/2014 1443   CO2 33* 10/21/2011 0900   BUN 11.7 03/19/2014 1443   BUN 9 10/21/2011 0900   CREATININE 0.9 03/19/2014 1443   CREATININE 0.80 10/21/2011 0900      Component Value Date/Time   CALCIUM 9.3 03/19/2014 1443   CALCIUM 9.9 10/21/2011 0900   ALKPHOS 69 03/19/2014 1443   ALKPHOS 61 09/12/2011 0833   AST 25 03/19/2014 1443   AST 20 09/12/2011 0833   ALT 18 03/19/2014 1443   ALT 13 09/12/2011 0833   BILITOT 0.31 03/19/2014 1443   BILITOT 0.5 09/12/2011 0833       Lab Results  Component Value Date   WBC 5.3 03/19/2014   HGB 12.6 03/19/2014   HCT 38.0 03/19/2014   MCV 90.5 03/19/2014   PLT 251 03/19/2014   NEUTROABS 2.9 03/19/2014     RADIOGRAPHIC STUDIES: I have personally reviewed the radiology reports and agreed with their findings. Mm Digital Screening Unilat R  03/19/2014   CLINICAL DATA:  Screening.  EXAM: DIGITAL SCREENING UNILATERAL RIGHT MAMMOGRAM WITH CAD  COMPARISON:  Previous exam(s).  ACR Breast Density Category b: There are scattered areas of fibroglandular density.  FINDINGS: The patient has had a left mastectomy. There are  no findings suspicious for malignancy. Images were processed with CAD.  IMPRESSION: No mammographic evidence of malignancy. A result letter of this screening mammogram will be mailed directly to the patient.  RECOMMENDATION: Screening mammogram in one year. (Code:SM-B-01Y)  BI-RADS CATEGORY  1: Negative.   Electronically Signed   By: Conchita Paris M.D.   On: 03/19/2014 11:13     ASSESSMENT & PLAN:  Breast cancer, left Left breast microscopic invasive ductal carcinoma ER PR 0%, HER-2 positive ratio 4.68 status post left breast mastectomy 10/25/2011, T1 N0 M0 stage IA  Breast cancer surveillance: Breast exam and mammogram done 03/19/2014 are normal Patient wanted to follow-up with her primary care physician for her breast examination and breast cancer surveillance. I felt it was reasonable to do so. If there was ever any further problems she can call us and we can see her on an as-needed basis.    No orders of the defined types were placed in this encounter.   The patient has a good understanding of the overall plan. she agrees with it. She will call with any problems that may develop before her next visit here.  Rulon Eisenmenger, MD

## 2014-05-11 IMAGING — CT NM PET TUM IMG RESTAG (PS) SKULL BASE T - THIGH
4 series · 25 of 25 positions shown · IV contrast (350 OM)
Comparison: Abdominal pelvic CT of 10/04/2006.  No prior PET.

CLINICAL DATA: Initial treatment strategy for locally advanced left
breast cancer.

NUCLEAR MEDICINE PET CT SKULL BASE TO THIGH
TECHNIQUE: Technique:  19.1 mCi F-18 FDG was injected
intravenously. CT data was obtained and used for attenuation
correction and anatomic localization only.  (This was not acquired
as a diagnostic CT examination.) Additional exam technical data
entered on technologist worksheet.

[Series 2: ct images · axial · 3.8mm · 0.98mm/px · z∈[-745,-19]mm · 11 of 222 slices shown]
[im 1/222]
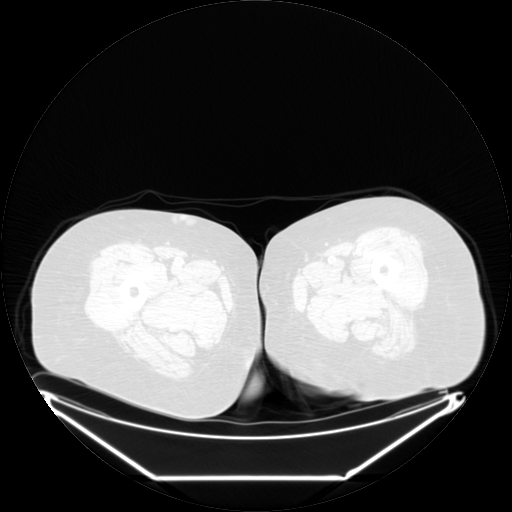
[im 23/222]
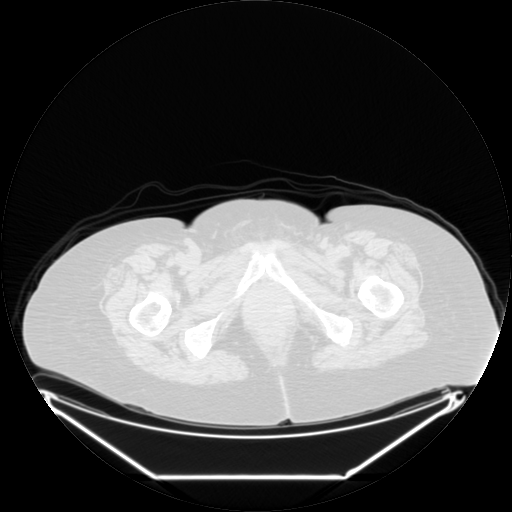
[im 45/222]
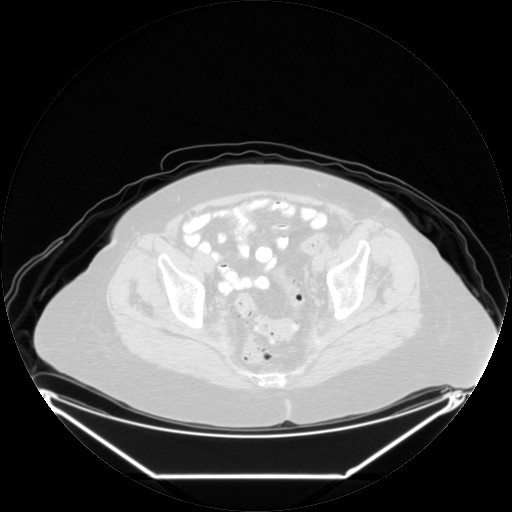
[im 67/222]
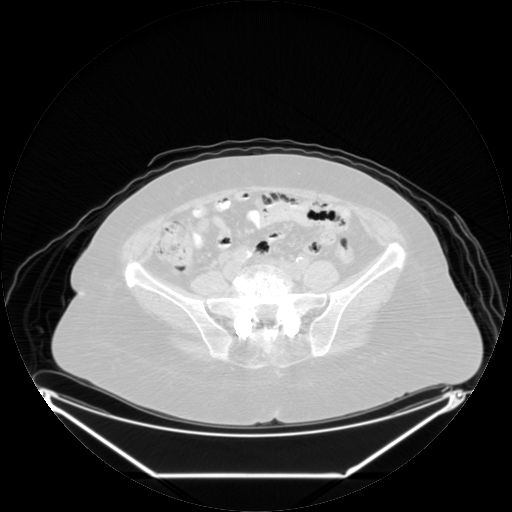
[im 89/222]
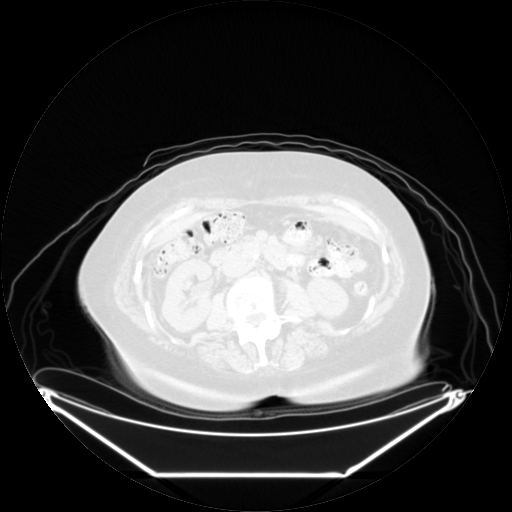
[im 111/222]
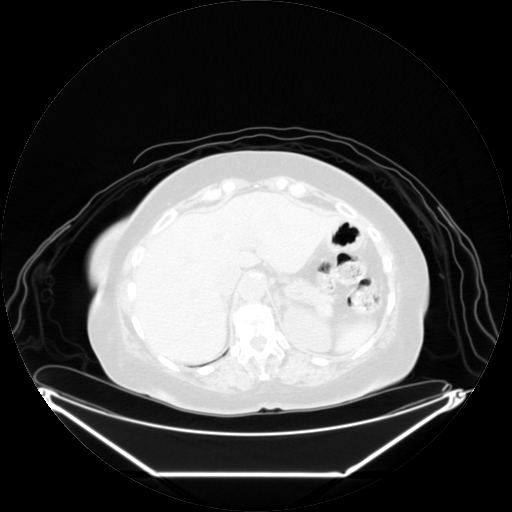
[im 133/222]
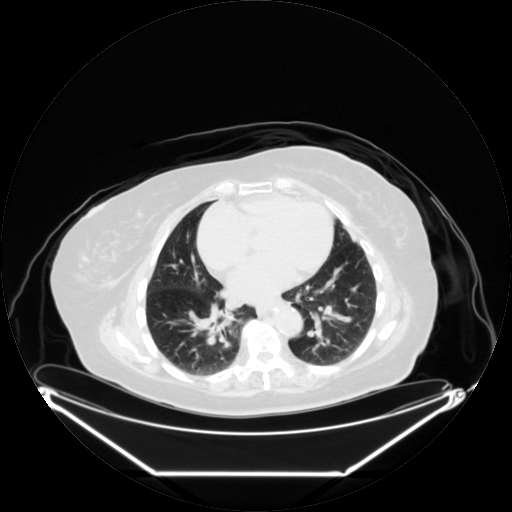
[im 155/222]
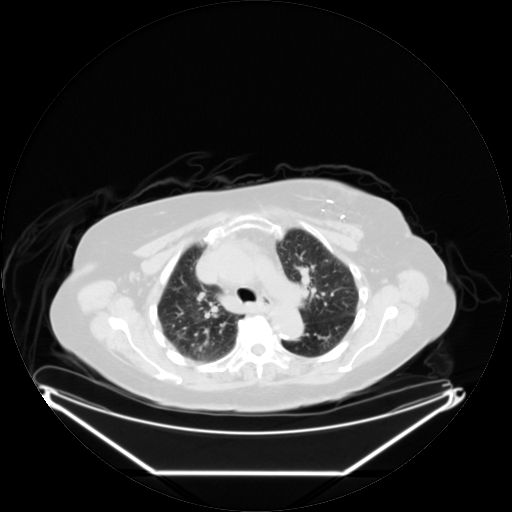
[im 177/222  brain]
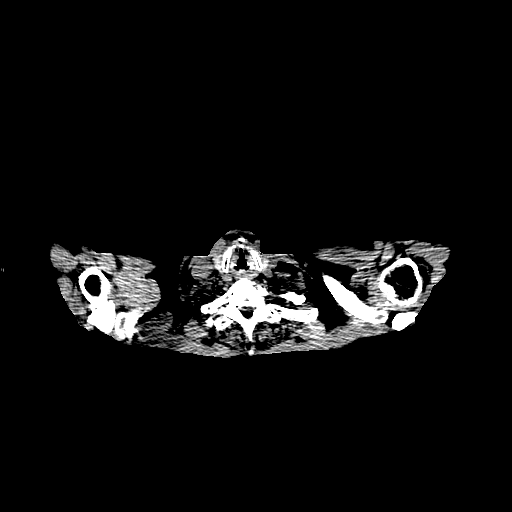
[im 199/222]
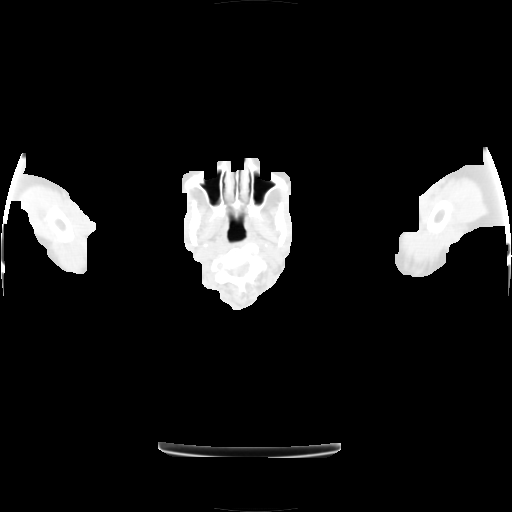
[im 222/222  brain]
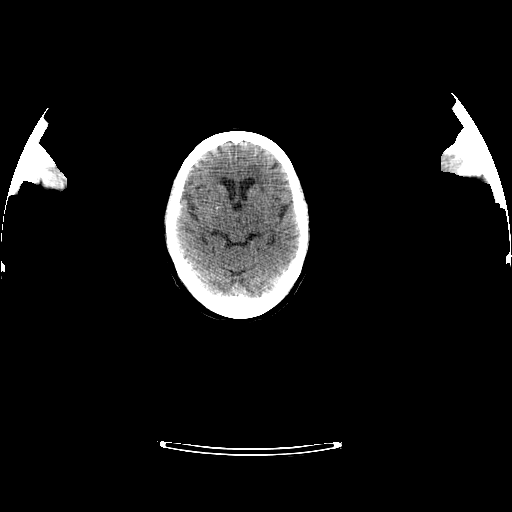

[Series 123: mip · coronal · 3.3mm · 4.69mm/px · 1 of 30 slices shown]
[im 1/30]
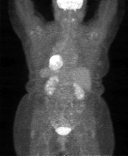

[Series 151: reformatted · axial · 3.3mm · 3.91mm/px · z∈[-745,-19]mm · 10 of 221 slices shown (1 of 2)]
[im 1/221]
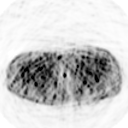
[im 25/221]
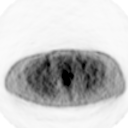
[im 49/221]
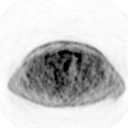
[im 74/221]
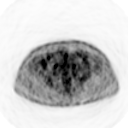
[im 98/221]
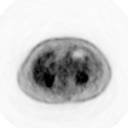
[im 123/221]
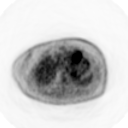
[im 147/221]
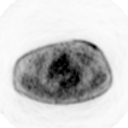
[im 172/221]
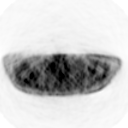
[im 196/221]
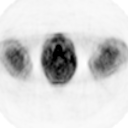
[im 221/221]
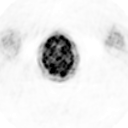

[Series 153: reformatted · coronal · 4.7mm · 5.83mm/px · 3 of 60 slices shown (2 of 2)]
[im 1/60]
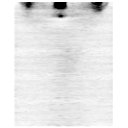
[im 30/60]
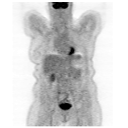
[im 60/60]
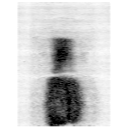

[25 of 25 positions shown; findings below may reference images not displayed]

FINDINGS: Neck: Physiologic palatine tonsil hypermetabolism.

Chest:  Mild hypermetabolism corresponding to either the areola or
mild skin thickening within the left breast on image 79.  This
measures on the order of a S.U.V. max of 2.5.

Abdomen/Pelvis:  A focus of hypermetabolism within the right thigh
hemi pelvis measures a S.U.V. max of 4.2 on approximately image
165.  This is without definite CT correlate.

[place]:  No abnormal hypermetabolism.

CT images performed for attenuation correction demonstrate no
significant findings within the neck.  Heterogeneous density of the
thyroid which is nonspecific.  Mild cardiomegaly with coronary
artery atherosclerosis.  Surgical clips in the deep left breast.
No axillary adenopathy.  4 mm interpolar left renal calculus.
Normal adrenal glands.  Scattered colonic diverticula.  Prominent
veins in the subcutaneous tissues of the right lower extremity on
image 218.  Lumbosacral spine fixation, the L5 screws extending
anterior to the vertebral cortex.
IMPRESSION: 1.  Mild hypermetabolism about the left breast skin and could be
physiologic and related to the areola or represent the primary
lesion.
2.  No evidence of hypermetabolic metastatic disease.
3.  Likely physiologic right pelvic hypermetabolism (ureteric).
Recommend attention on follow-up.
4.  Incidental findings, including left-sided renal calculus.
5.  Prominent collateral veins in the right lower extremity.
Cannot exclude chronic deep venous thrombosis.  Consider physical
exam and possibly ultrasound correlation.

## 2014-11-14 ENCOUNTER — Encounter: Payer: Self-pay | Admitting: Gastroenterology

## 2015-02-19 ENCOUNTER — Other Ambulatory Visit: Payer: Self-pay

## 2015-02-19 DIAGNOSIS — Z1231 Encounter for screening mammogram for malignant neoplasm of breast: Secondary | ICD-10-CM

## 2015-03-23 ENCOUNTER — Ambulatory Visit
Admission: RE | Admit: 2015-03-23 | Discharge: 2015-03-23 | Disposition: A | Discharge status: Home / Self Care | Payer: Medicare Other | Source: Ambulatory Visit

## 2015-03-23 DIAGNOSIS — Z1231 Encounter for screening mammogram for malignant neoplasm of breast: Secondary | ICD-10-CM

## 2016-03-07 ENCOUNTER — Other Ambulatory Visit: Payer: Self-pay | Admitting: Family Medicine

## 2016-03-07 DIAGNOSIS — Z1231 Encounter for screening mammogram for malignant neoplasm of breast: Secondary | ICD-10-CM

## 2016-03-24 ENCOUNTER — Ambulatory Visit
Admission: RE | Admit: 2016-03-24 | Discharge: 2016-03-24 | Disposition: A | Discharge status: Home / Self Care | Payer: Medicare Other | Source: Ambulatory Visit | Attending: Family Medicine | Admitting: Family Medicine

## 2016-03-24 DIAGNOSIS — Z1231 Encounter for screening mammogram for malignant neoplasm of breast: Secondary | ICD-10-CM

## 2016-03-24 HISTORY — DX: Malignant neoplasm of unspecified site of unspecified female breast: C50.919

## 2016-05-31 ENCOUNTER — Ambulatory Visit: Payer: Medicare Other | Admitting: Podiatry

## 2016-06-15 ENCOUNTER — Ambulatory Visit: Payer: Medicare Other | Admitting: Podiatry

## 2017-02-17 ENCOUNTER — Other Ambulatory Visit: Payer: Self-pay | Admitting: Family Medicine

## 2017-02-17 DIAGNOSIS — Z1231 Encounter for screening mammogram for malignant neoplasm of breast: Secondary | ICD-10-CM

## 2017-03-27 ENCOUNTER — Ambulatory Visit
Admission: RE | Admit: 2017-03-27 | Discharge: 2017-03-27 | Disposition: A | Discharge status: Home / Self Care | Payer: Medicare Other | Source: Ambulatory Visit | Attending: Family Medicine | Admitting: Family Medicine

## 2017-03-27 DIAGNOSIS — Z1231 Encounter for screening mammogram for malignant neoplasm of breast: Secondary | ICD-10-CM

## 2018-02-23 ENCOUNTER — Other Ambulatory Visit: Payer: Self-pay | Admitting: Family Medicine

## 2018-02-23 DIAGNOSIS — Z1231 Encounter for screening mammogram for malignant neoplasm of breast: Secondary | ICD-10-CM

## 2018-03-29 ENCOUNTER — Ambulatory Visit
Admission: RE | Admit: 2018-03-29 | Discharge: 2018-03-29 | Disposition: A | Discharge status: Home / Self Care | Payer: Medicare Other | Source: Ambulatory Visit | Attending: Family Medicine | Admitting: Family Medicine

## 2018-03-29 DIAGNOSIS — Z1231 Encounter for screening mammogram for malignant neoplasm of breast: Secondary | ICD-10-CM

## 2019-03-14 ENCOUNTER — Other Ambulatory Visit: Payer: Self-pay | Admitting: Family Medicine

## 2019-03-14 DIAGNOSIS — Z1231 Encounter for screening mammogram for malignant neoplasm of breast: Secondary | ICD-10-CM

## 2019-04-11 ENCOUNTER — Other Ambulatory Visit: Payer: Self-pay

## 2019-04-11 ENCOUNTER — Ambulatory Visit
Admission: RE | Admit: 2019-04-11 | Discharge: 2019-04-11 | Disposition: A | Discharge status: Home / Self Care | Payer: Medicare Other | Source: Ambulatory Visit | Attending: Family Medicine | Admitting: Family Medicine

## 2019-04-11 ENCOUNTER — Other Ambulatory Visit: Payer: Self-pay | Admitting: Family Medicine

## 2019-04-11 DIAGNOSIS — Z1231 Encounter for screening mammogram for malignant neoplasm of breast: Secondary | ICD-10-CM

## 2019-07-17 ENCOUNTER — Other Ambulatory Visit: Payer: Self-pay | Admitting: Family Medicine

## 2019-07-17 DIAGNOSIS — Z1231 Encounter for screening mammogram for malignant neoplasm of breast: Secondary | ICD-10-CM

## 2020-03-10 DIAGNOSIS — Z961 Presence of intraocular lens: Secondary | ICD-10-CM | POA: Diagnosis not present

## 2020-03-12 DIAGNOSIS — Z961 Presence of intraocular lens: Secondary | ICD-10-CM | POA: Diagnosis not present

## 2020-04-15 ENCOUNTER — Inpatient Hospital Stay: Admission: RE | Admit: 2020-04-15 | Payer: Medicare Other | Source: Ambulatory Visit

## 2020-06-04 ENCOUNTER — Other Ambulatory Visit: Payer: Self-pay | Admitting: Advanced Practice Midwife

## 2020-06-04 DIAGNOSIS — Z1231 Encounter for screening mammogram for malignant neoplasm of breast: Secondary | ICD-10-CM

## 2020-06-05 ENCOUNTER — Inpatient Hospital Stay: Admission: RE | Admit: 2020-06-05 | Payer: Medicare Other | Source: Ambulatory Visit

## 2020-07-24 ENCOUNTER — Other Ambulatory Visit: Payer: Self-pay | Admitting: Family Medicine

## 2020-07-24 ENCOUNTER — Ambulatory Visit
Admission: RE | Admit: 2020-07-24 | Discharge: 2020-07-24 | Disposition: A | Discharge status: Home / Self Care | Payer: Medicare Other | Source: Ambulatory Visit | Attending: Advanced Practice Midwife | Admitting: Advanced Practice Midwife

## 2020-07-24 ENCOUNTER — Other Ambulatory Visit: Payer: Self-pay

## 2020-07-24 DIAGNOSIS — Z1231 Encounter for screening mammogram for malignant neoplasm of breast: Secondary | ICD-10-CM

## 2020-08-05 DIAGNOSIS — M4155 Other secondary scoliosis, thoracolumbar region: Secondary | ICD-10-CM | POA: Diagnosis not present

## 2020-08-05 DIAGNOSIS — I10 Essential (primary) hypertension: Secondary | ICD-10-CM | POA: Diagnosis not present

## 2020-08-10 DIAGNOSIS — B029 Zoster without complications: Secondary | ICD-10-CM | POA: Diagnosis not present

## 2020-08-10 DIAGNOSIS — M5459 Other low back pain: Secondary | ICD-10-CM | POA: Diagnosis not present

## 2020-08-10 DIAGNOSIS — N3281 Overactive bladder: Secondary | ICD-10-CM | POA: Diagnosis not present

## 2020-08-10 DIAGNOSIS — E782 Mixed hyperlipidemia: Secondary | ICD-10-CM | POA: Diagnosis not present

## 2020-08-10 DIAGNOSIS — Z0000 Encounter for general adult medical examination without abnormal findings: Secondary | ICD-10-CM | POA: Diagnosis not present

## 2020-08-10 DIAGNOSIS — Z853 Personal history of malignant neoplasm of breast: Secondary | ICD-10-CM | POA: Diagnosis not present

## 2020-08-10 DIAGNOSIS — I10 Essential (primary) hypertension: Secondary | ICD-10-CM | POA: Diagnosis not present

## 2020-08-10 DIAGNOSIS — L259 Unspecified contact dermatitis, unspecified cause: Secondary | ICD-10-CM | POA: Diagnosis not present

## 2020-08-10 DIAGNOSIS — M810 Age-related osteoporosis without current pathological fracture: Secondary | ICD-10-CM | POA: Diagnosis not present

## 2020-08-19 DIAGNOSIS — E876 Hypokalemia: Secondary | ICD-10-CM | POA: Diagnosis not present

## 2020-08-25 DIAGNOSIS — M545 Low back pain, unspecified: Secondary | ICD-10-CM | POA: Diagnosis not present

## 2020-08-25 DIAGNOSIS — G8929 Other chronic pain: Secondary | ICD-10-CM | POA: Diagnosis not present

## 2020-08-28 DIAGNOSIS — Z8781 Personal history of (healed) traumatic fracture: Secondary | ICD-10-CM | POA: Diagnosis not present

## 2020-08-28 DIAGNOSIS — M546 Pain in thoracic spine: Secondary | ICD-10-CM | POA: Diagnosis not present

## 2020-09-14 DIAGNOSIS — M545 Low back pain, unspecified: Secondary | ICD-10-CM | POA: Diagnosis not present

## 2020-09-25 DIAGNOSIS — M545 Low back pain, unspecified: Secondary | ICD-10-CM | POA: Diagnosis not present

## 2020-09-25 DIAGNOSIS — I10 Essential (primary) hypertension: Secondary | ICD-10-CM | POA: Diagnosis not present

## 2020-11-04 DIAGNOSIS — M47816 Spondylosis without myelopathy or radiculopathy, lumbar region: Secondary | ICD-10-CM | POA: Diagnosis not present

## 2020-11-04 DIAGNOSIS — M4155 Other secondary scoliosis, thoracolumbar region: Secondary | ICD-10-CM | POA: Diagnosis not present

## 2020-11-04 DIAGNOSIS — I10 Essential (primary) hypertension: Secondary | ICD-10-CM | POA: Diagnosis not present

## 2020-11-09 DIAGNOSIS — B9789 Other viral agents as the cause of diseases classified elsewhere: Secondary | ICD-10-CM | POA: Diagnosis not present

## 2020-11-09 DIAGNOSIS — J988 Other specified respiratory disorders: Secondary | ICD-10-CM | POA: Diagnosis not present

## 2020-11-16 DIAGNOSIS — J069 Acute upper respiratory infection, unspecified: Secondary | ICD-10-CM | POA: Diagnosis not present

## 2021-02-12 DIAGNOSIS — M5459 Other low back pain: Secondary | ICD-10-CM | POA: Diagnosis not present

## 2021-02-12 DIAGNOSIS — I10 Essential (primary) hypertension: Secondary | ICD-10-CM | POA: Diagnosis not present

## 2021-02-12 DIAGNOSIS — E782 Mixed hyperlipidemia: Secondary | ICD-10-CM | POA: Diagnosis not present

## 2021-02-12 DIAGNOSIS — N182 Chronic kidney disease, stage 2 (mild): Secondary | ICD-10-CM | POA: Diagnosis not present

## 2021-03-15 DIAGNOSIS — Z961 Presence of intraocular lens: Secondary | ICD-10-CM | POA: Diagnosis not present

## 2021-04-07 DIAGNOSIS — R519 Headache, unspecified: Secondary | ICD-10-CM | POA: Diagnosis not present

## 2021-04-20 DIAGNOSIS — M542 Cervicalgia: Secondary | ICD-10-CM | POA: Diagnosis not present

## 2021-04-20 DIAGNOSIS — S46012A Strain of muscle(s) and tendon(s) of the rotator cuff of left shoulder, initial encounter: Secondary | ICD-10-CM | POA: Diagnosis not present

## 2021-06-09 DIAGNOSIS — H9193 Unspecified hearing loss, bilateral: Secondary | ICD-10-CM | POA: Diagnosis not present

## 2021-06-09 DIAGNOSIS — M542 Cervicalgia: Secondary | ICD-10-CM | POA: Diagnosis not present

## 2021-06-28 ENCOUNTER — Other Ambulatory Visit: Payer: Self-pay | Admitting: Family Medicine

## 2021-06-28 DIAGNOSIS — R928 Other abnormal and inconclusive findings on diagnostic imaging of breast: Secondary | ICD-10-CM

## 2021-07-27 ENCOUNTER — Ambulatory Visit: Payer: Medicare Other

## 2021-07-28 ENCOUNTER — Other Ambulatory Visit: Payer: Self-pay | Admitting: Family Medicine

## 2021-07-28 DIAGNOSIS — Z1231 Encounter for screening mammogram for malignant neoplasm of breast: Secondary | ICD-10-CM

## 2021-08-02 ENCOUNTER — Encounter (HOSPITAL_COMMUNITY): Payer: Self-pay

## 2021-08-02 ENCOUNTER — Emergency Department (HOSPITAL_COMMUNITY): Payer: Medicare Other

## 2021-08-02 ENCOUNTER — Other Ambulatory Visit: Payer: Self-pay

## 2021-08-02 ENCOUNTER — Observation Stay (HOSPITAL_COMMUNITY)
Admission: EM | Admit: 2021-08-02 | Discharge: 2021-08-03 | Disposition: A | Discharge status: Home Health | Payer: Medicare Other | Attending: Internal Medicine | Admitting: Internal Medicine

## 2021-08-02 DIAGNOSIS — I4891 Unspecified atrial fibrillation: Principal | ICD-10-CM | POA: Insufficient documentation

## 2021-08-02 DIAGNOSIS — W19XXXA Unspecified fall, initial encounter: Secondary | ICD-10-CM | POA: Diagnosis not present

## 2021-08-02 DIAGNOSIS — R0781 Pleurodynia: Secondary | ICD-10-CM | POA: Diagnosis not present

## 2021-08-02 DIAGNOSIS — M5030 Other cervical disc degeneration, unspecified cervical region: Secondary | ICD-10-CM | POA: Diagnosis not present

## 2021-08-02 DIAGNOSIS — M4126 Other idiopathic scoliosis, lumbar region: Secondary | ICD-10-CM | POA: Diagnosis not present

## 2021-08-02 DIAGNOSIS — Z79899 Other long term (current) drug therapy: Secondary | ICD-10-CM | POA: Diagnosis not present

## 2021-08-02 DIAGNOSIS — Z853 Personal history of malignant neoplasm of breast: Secondary | ICD-10-CM | POA: Insufficient documentation

## 2021-08-02 DIAGNOSIS — I10 Essential (primary) hypertension: Secondary | ICD-10-CM | POA: Diagnosis not present

## 2021-08-02 DIAGNOSIS — R42 Dizziness and giddiness: Secondary | ICD-10-CM

## 2021-08-02 DIAGNOSIS — R404 Transient alteration of awareness: Secondary | ICD-10-CM | POA: Diagnosis not present

## 2021-08-02 DIAGNOSIS — S299XXA Unspecified injury of thorax, initial encounter: Secondary | ICD-10-CM | POA: Diagnosis not present

## 2021-08-02 DIAGNOSIS — I517 Cardiomegaly: Secondary | ICD-10-CM | POA: Diagnosis not present

## 2021-08-02 DIAGNOSIS — Z743 Need for continuous supervision: Secondary | ICD-10-CM | POA: Diagnosis not present

## 2021-08-02 DIAGNOSIS — S199XXA Unspecified injury of neck, initial encounter: Secondary | ICD-10-CM | POA: Diagnosis not present

## 2021-08-02 DIAGNOSIS — W010XXA Fall on same level from slipping, tripping and stumbling without subsequent striking against object, initial encounter: Secondary | ICD-10-CM | POA: Diagnosis not present

## 2021-08-02 DIAGNOSIS — E785 Hyperlipidemia, unspecified: Secondary | ICD-10-CM | POA: Diagnosis present

## 2021-08-02 DIAGNOSIS — M12811 Other specific arthropathies, not elsewhere classified, right shoulder: Secondary | ICD-10-CM | POA: Diagnosis present

## 2021-08-02 DIAGNOSIS — Z043 Encounter for examination and observation following other accident: Secondary | ICD-10-CM | POA: Diagnosis not present

## 2021-08-02 DIAGNOSIS — E876 Hypokalemia: Secondary | ICD-10-CM | POA: Diagnosis not present

## 2021-08-02 DIAGNOSIS — G8929 Other chronic pain: Secondary | ICD-10-CM | POA: Diagnosis present

## 2021-08-02 DIAGNOSIS — M4313 Spondylolisthesis, cervicothoracic region: Secondary | ICD-10-CM | POA: Diagnosis not present

## 2021-08-02 DIAGNOSIS — Z66 Do not resuscitate: Secondary | ICD-10-CM | POA: Insufficient documentation

## 2021-08-02 DIAGNOSIS — S46012A Strain of muscle(s) and tendon(s) of the rotator cuff of left shoulder, initial encounter: Secondary | ICD-10-CM | POA: Diagnosis not present

## 2021-08-02 DIAGNOSIS — M549 Dorsalgia, unspecified: Secondary | ICD-10-CM | POA: Diagnosis not present

## 2021-08-02 DIAGNOSIS — I6529 Occlusion and stenosis of unspecified carotid artery: Secondary | ICD-10-CM | POA: Diagnosis not present

## 2021-08-02 DIAGNOSIS — I700 Atherosclerosis of aorta: Secondary | ICD-10-CM | POA: Diagnosis not present

## 2021-08-02 DIAGNOSIS — M75101 Unspecified rotator cuff tear or rupture of right shoulder, not specified as traumatic: Secondary | ICD-10-CM | POA: Diagnosis present

## 2021-08-02 DIAGNOSIS — M19012 Primary osteoarthritis, left shoulder: Secondary | ICD-10-CM | POA: Diagnosis not present

## 2021-08-02 LAB — CBC
HCT: 43 % (ref 36.0–46.0)
Hemoglobin: 13.7 g/dL (ref 12.0–15.0)
MCH: 31 pg (ref 26.0–34.0)
MCHC: 31.9 g/dL (ref 30.0–36.0)
MCV: 97.3 fL (ref 80.0–100.0)
Platelets: 235 10*3/uL (ref 150–400)
RBC: 4.42 MIL/uL (ref 3.87–5.11)
RDW: 13.8 % (ref 11.5–15.5)
WBC: 4.9 10*3/uL (ref 4.0–10.5)
nRBC: 0 % (ref 0.0–0.2)

## 2021-08-02 LAB — BASIC METABOLIC PANEL
Anion gap: 11 (ref 5–15)
BUN: 11 mg/dL (ref 8–23)
CO2: 31 mmol/L (ref 22–32)
Calcium: 10.1 mg/dL (ref 8.9–10.3)
Chloride: 100 mmol/L (ref 98–111)
Creatinine, Ser: 0.84 mg/dL (ref 0.44–1.00)
GFR, Estimated: >60 mL/min (ref >60)
Glucose, Bld: 115 mg/dL — ABNORMAL HIGH (ref 70–99)
Potassium: 2.9 mmol/L — ABNORMAL LOW (ref 3.5–5.1)
Sodium: 142 mmol/L (ref 135–145)

## 2021-08-02 LAB — TROPONIN I (HIGH SENSITIVITY)
Troponin I (High Sensitivity): 17 ng/L (ref <18)
Troponin I (High Sensitivity): 20 ng/L — ABNORMAL HIGH (ref <18)

## 2021-08-02 LAB — MAGNESIUM: Magnesium: 1.7 mg/dL (ref 1.7–2.4)

## 2021-08-02 MED ORDER — SIMVASTATIN 20 MG PO TABS: 20.0000 mg | ORAL_TABLET | Freq: Every evening | ORAL | Status: DC

## 2021-08-02 MED ORDER — ENOXAPARIN SODIUM 40 MG/0.4ML IJ SOSY
40.0000 mg | PREFILLED_SYRINGE | INTRAMUSCULAR | Status: DC
  Administered 2021-08-02: 40 mg via SUBCUTANEOUS
  Filled 2021-08-02: qty 0.4

## 2021-08-02 MED ORDER — ONDANSETRON HCL 4 MG/2ML IJ SOLN: 4.0000 mg | Freq: Four times a day (QID) | INTRAMUSCULAR | Status: DC | PRN

## 2021-08-02 MED ORDER — POLYVINYL ALCOHOL 1.4 % OP SOLN
2.0000 [drp] | Freq: Every day | OPHTHALMIC | Status: DC
  Administered 2021-08-03: 2 [drp] via OPHTHALMIC
  Filled 2021-08-02 (×2): qty 15

## 2021-08-02 MED ORDER — POTASSIUM CHLORIDE 10 MEQ/100ML IV SOLN
10.0000 meq | Freq: Once | INTRAVENOUS | Status: AC
  Administered 2021-08-02: 10 meq via INTRAVENOUS
  Filled 2021-08-02: qty 100

## 2021-08-02 MED ORDER — ACETAMINOPHEN 325 MG PO TABS: 650.0000 mg | ORAL_TABLET | Freq: Four times a day (QID) | ORAL | Status: DC | PRN

## 2021-08-02 MED ORDER — ATORVASTATIN CALCIUM 10 MG PO TABS
10.0000 mg | ORAL_TABLET | Freq: Every day | ORAL | Status: DC
Start: 2021-08-02 — End: 2021-08-03
  Administered 2021-08-02: 10 mg via ORAL
  Filled 2021-08-02: qty 1

## 2021-08-02 MED ORDER — METOPROLOL SUCCINATE ER 100 MG PO TB24
100.0000 mg | ORAL_TABLET | Freq: Every day | ORAL | Status: DC
  Administered 2021-08-03: 100 mg via ORAL
  Filled 2021-08-02: qty 1

## 2021-08-02 MED ORDER — POLYETHYL GLYCOL-PROPYL GLYCOL 0.4-0.3 % OP GEL: OPHTHALMIC | Status: DC

## 2021-08-02 MED ORDER — OXYCODONE HCL 5 MG PO TABS: 5.0000 mg | ORAL_TABLET | ORAL | Status: DC | PRN

## 2021-08-02 MED ORDER — METHOCARBAMOL 1000 MG/10ML IJ SOLN: 500.0000 mg | Freq: Four times a day (QID) | INTRAVENOUS | Status: DC | PRN

## 2021-08-02 MED ORDER — LISINOPRIL 20 MG PO TABS
40.0000 mg | ORAL_TABLET | Freq: Every day | ORAL | Status: DC
  Administered 2021-08-03: 40 mg via ORAL
  Filled 2021-08-02: qty 2

## 2021-08-02 MED ORDER — MORPHINE SULFATE (PF) 2 MG/ML IV SOLN
2.0000 mg | INTRAVENOUS | Status: DC | PRN
  Administered 2021-08-02: 2 mg via INTRAVENOUS
  Filled 2021-08-02: qty 1

## 2021-08-02 MED ORDER — SODIUM CHLORIDE 0.9% FLUSH
3.0000 mL | Freq: Two times a day (BID) | INTRAVENOUS | Status: DC
  Administered 2021-08-02: 3 mL via INTRAVENOUS

## 2021-08-02 MED ORDER — ACETAMINOPHEN 650 MG RE SUPP: 650.0000 mg | Freq: Four times a day (QID) | RECTAL | Status: DC | PRN

## 2021-08-02 MED ORDER — ONDANSETRON HCL 4 MG PO TABS: 4.0000 mg | ORAL_TABLET | Freq: Four times a day (QID) | ORAL | Status: DC | PRN

## 2021-08-02 MED ORDER — HYDRALAZINE HCL 20 MG/ML IJ SOLN
5.0000 mg | INTRAMUSCULAR | Status: DC | PRN
  Administered 2021-08-02: 5 mg via INTRAVENOUS
  Filled 2021-08-02: qty 1

## 2021-08-02 MED ORDER — SODIUM CHLORIDE 0.9 % IV BOLUS
1000.0000 mL | Freq: Once | INTRAVENOUS | Status: AC
  Administered 2021-08-02: 1000 mL via INTRAVENOUS

## 2021-08-02 MED ORDER — POTASSIUM CHLORIDE CRYS ER 20 MEQ PO TBCR
40.0000 meq | EXTENDED_RELEASE_TABLET | Freq: Once | ORAL | Status: AC
  Administered 2021-08-02: 40 meq via ORAL
  Filled 2021-08-02: qty 2

## 2021-08-02 MED ORDER — DILTIAZEM HCL 60 MG PO TABS
30.0000 mg | ORAL_TABLET | Freq: Four times a day (QID) | ORAL | Status: DC
  Administered 2021-08-02 – 2021-08-03 (×4): 30 mg via ORAL
  Filled 2021-08-02 (×7): qty 1

## 2021-08-02 NOTE — ED Provider Triage Note (Signed)
Emergency Medicine Provider Triage Evaluation Note  Judy [name] , a 88 y.o. female  was evaluated in triage.  Pt complains of dizziness, fall just prior to arrival.  Patient had death of her husband around a week ago and has been having decreased oral intake.  Patient reports pain throughout her entire back, she reports she fell directly on her back.  She denies any numbness, tingling.  Patient endorses left shoulder pain, deformity noted by EMS.  On my exam patient without significant deformity, some concern that she may have anterior subluxation, or possible fracture, I see no evidence of acute shoulder dislocation.  Patient reports no pain in hips, legs, chest.  She denies any feeling of heart racing, chest pain prior to the fall.  She denies any confusion.  She reports that she stood up suddenly and had dizziness.  She denies head injury, loss of consciousness, does not take any blood thinners.  Review of Systems  Positive: Fall, shoulder pain, dizziness Negative: Chest pain, shob, palpitations  Physical Exam  BP (!) 166/96 (BP Location: Right Arm)   Pulse 91   Temp 97.6 F (36.4 C) (Oral)   Resp 17   Ht '5\' 2"'$  (1.575 m)   Wt 65.8 kg   SpO2 96%   BMI 26.52 kg/m  Gen:   Awake, no distress   Resp:  Normal effort  MSK:   Moves extremities without difficulty  Other:  Some TTP humeral head on left, patient with normal cross arm adduction, intact radial, ulnar pulses  Medical Decision Making  Medically screening exam initiated at 11:34 AM.  Appropriate orders placed.  Judy [name] was informed that the remainder of the evaluation will be completed by another provider, this initial triage assessment does not replace that evaluation, and the importance of remaining in the ED until their evaluation is complete.  Workup initiated   Anselmo Pickler, [place] 08/02/21 1137

## 2021-08-02 NOTE — ED Provider Notes (Addendum)
[facility] EMERGENCY DEPARTMENT Provider Note   CSN: [id number] Arrival date & time: 08/02/21  1114     History  Chief Complaint  Patient presents with   Dizziness   Fall    Judy [name] is a 88 y.o. female with medical history significant for breast cancer, chronic low back pain, dyslipidemia, GERD, hypertension, osteoarthritis, osteoporosis.  The patient presents to ED for evaluation after fall.  Patient states that her husband died 1 week ago.  The patient states that since this time she has had decreased fluid intake as well as oral intake of solids.  Patient states for the last 2 days she has been dizzy.  Patient states that today she went from a sitting to a standing position and had sudden onset dizziness causing her to fall onto her back and left shoulder.  The patient denies being on blood thinners, denies striking her head, denies loss of consciousness.  Patient presents to ED complaining of back pain as well as left shoulder pain.  Per EMS, there is some concern for deformity but on my inspection there is no obvious deformity.  Patient reports that her shoulder pain began 4 days ago when she attempted to lift a 40 pound bag of mulch out of her trunk but was exacerbated upon falling today.  Patient denies history of A-fib.  Patient denies chest pain, shortness of breath, nausea, vomiting, diarrhea, abdominal pain, fevers, headache, neck pain.   Dizziness Associated symptoms: no chest pain, no diarrhea, no headaches, no nausea, no shortness of breath, no vomiting and no weakness   Fall Pertinent negatives include no chest pain, no abdominal pain, no headaches and no shortness of breath.       Home Medications Prior to Admission medications   Medication Sig Start Date End Date Taking? Authorizing Provider  amLODipine (NORVASC) 5 MG tablet Take 1 tablet (5 mg total) by mouth daily. 12/06/12   Corky Crafts, MD  cyclobenzaprine (FLEXERIL) 10 MG tablet  Take 10 mg by mouth 3 (three) times daily as needed for muscle spasms.    [provider]  hydrochlorothiazide (HYDRODIURIL) 25 MG tablet Take 25 mg by mouth daily.    [provider]  lisinopril (PRINIVIL,ZESTRIL) 40 MG tablet Take 40 mg by mouth daily.    [provider]  metoprolol succinate (TOPROL-XL) 100 MG 24 hr tablet Take 100 mg by mouth daily. Take with or immediately following a meal.    [provider]  Multiple Vitamin (MULTIVITAMIN) capsule Take 1 capsule by mouth daily.    [provider]  nabumetone (RELAFEN) 500 MG tablet Take 500 mg by mouth 2 (two) times daily as needed for pain.    [provider]  potassium chloride (K-DUR,KLOR-CON) 10 MEQ tablet Take 10 mEq by mouth 2 (two) times daily.    [provider]  psyllium (METAMUCIL SMOOTH TEXTURE) 28 % packet Take 1 packet by mouth 2 (two) times daily.    [provider]  simvastatin (ZOCOR) 20 MG tablet Take 20 mg by mouth every evening.    [provider]      Allergies    Amoxicillin, Codeine, Amlodipine, Aricept [donepezil hcl], Clonidine derivatives, and Sertraline    Review of Systems   Review of Systems  Constitutional:  Negative for fever.  Respiratory:  Negative for shortness of breath.   Cardiovascular:  Negative for chest pain.  Gastrointestinal:  Negative for abdominal pain, diarrhea, nausea and vomiting.  Musculoskeletal:  Positive for back pain. Negative for neck pain.  Neurological:  Positive for dizziness. Negative for weakness, numbness and headaches.    Physical Exam Updated Vital Signs BP (!) 182/115   Pulse 100   Temp 97.6 F (36.4 C) (Oral)   Resp 16   Ht 5\' 2"  (1.575 m)   Wt 65.8 kg   SpO2 98%   BMI 26.52 kg/m  Physical Exam Vitals and nursing note reviewed.  Constitutional:      General: She is not in acute distress.    Appearance: She is not ill-appearing, toxic-appearing or diaphoretic.  HENT:     Head:  Normocephalic and atraumatic.     Nose: Nose normal. No congestion.     Mouth/Throat:     Mouth: Mucous membranes are moist.     Pharynx: Oropharynx is clear.  Eyes:     Extraocular Movements: Extraocular movements intact.     Conjunctiva/sclera: Conjunctivae normal.     Pupils: Pupils are equal, round, and reactive to light.  Cardiovascular:     Rate and Rhythm: Normal rate and regular rhythm.     Pulses:          Radial pulses are 1+ on the right side and 1+ on the left side.  Pulmonary:     Effort: Pulmonary effort is normal.     Breath sounds: Normal breath sounds. No wheezing.  Abdominal:     General: Abdomen is flat. Bowel sounds are normal.     Palpations: Abdomen is soft.     Tenderness: There is no abdominal tenderness.  Musculoskeletal:     Right shoulder: Normal.     Left shoulder: Swelling and tenderness present. No deformity or effusion. Decreased range of motion.     Cervical back: Normal range of motion and neck supple. No tenderness.     Right lower leg: No edema.     Left lower leg: No edema.  Skin:    General: Skin is warm and dry.     Capillary Refill: Capillary refill takes less than 2 seconds.  Neurological:     General: No focal deficit present.     Mental Status: She is alert and oriented to person, place, and time.     GCS: GCS eye subscore is 4. GCS verbal subscore is 5. GCS motor subscore is 6.     Cranial Nerves: Cranial nerves 2-12 are intact. No cranial nerve deficit.     Sensory: Sensation is intact. No sensory deficit.     Motor: Motor function is intact. No weakness.     Coordination: Coordination is intact. Heel to Shin Test normal.     ED Results / Procedures / Treatments   Labs (all labs ordered are listed, but only abnormal results are displayed) Labs Reviewed  BASIC METABOLIC PANEL - Abnormal; Notable for the following components:      Result Value   Potassium 2.9 (*)    Glucose, Bld 115 (*)    All other components within normal  limits  CBC  MAGNESIUM  TROPONIN I (HIGH SENSITIVITY)  TROPONIN I (HIGH SENSITIVITY)    EKG EKG Interpretation  Date/Time:  Monday August 02 2021 11:20:44 EDT Ventricular Rate:  94 PR Interval:    QRS Duration: 160 QT Interval:  434 QTC Calculation: 542 R Axis:   -57 Text Interpretation: Atrial fibrillation Right bundle branch block Left anterior fascicular block ** Bifascicular block ** Left ventricular hypertrophy with repolarization abnormality ( R in aVL , Romhilt-Estes ) afib  new since 2013 Confirmed by Pricilla Loveless [id number]) on 08/02/2021 2:07:33 PM  Radiology CT Lumbar Spine Wo Contrast  Result Date: 08/02/2021 CLINICAL DATA:  Back trauma.  History of breast cancer. EXAM: CT THORACIC AND LUMBAR SPINE WITHOUT CONTRAST TECHNIQUE: Multidetector CT imaging of the thoracic and lumbar spine was performed without contrast. Multiplanar CT image reconstructions were also generated. RADIATION DOSE REDUCTION: This exam was performed according to the departmental dose-optimization program which includes automated exposure control, adjustment of the mA and/or kV according to patient size and/or use of iterative reconstruction technique. COMPARISON:  Chest and rib radiographs same date, PET-CT 10/06/2011, lumbar spine radiographs 01/25/2019 and lumbar MRI 11/29/2017. FINDINGS: CT THORACIC SPINE FINDINGS Alignment: Minimal convex left scoliosis. The lateral alignment is normal. Vertebrae: No evidence of acute fracture or traumatic subluxation. Multilevel spondylosis with disc space narrowing and endplate osteophytes. Paraspinal and other soft tissues: No acute paraspinal findings are evident. Diffuse atherosclerosis of the aorta, great vessels and coronary arteries. Dependent atelectasis in both lungs. Disc levels: Multilevel spondylosis with small disc protrusions and endplate osteophytes. No large disc herniation or high-grade spinal stenosis identified. CT LUMBAR SPINE FINDINGS Segmentation: 5 lumbar  type vertebral bodies. Alignment: S-shaped lumbar scoliosis, similar to previous MRI. The lateral alignment is within physiologic limits. Vertebrae: Status post laminectomy and PLIF at L5-S1. The hardware is intact without loosening. Solid interbody fusion at L4-5 and L5-S1. No evidence of acute fracture or pars defect. There is multilevel spondylosis. Paraspinal and other soft tissues: No acute paraspinal findings are seen. There is diffuse aortic and branch vessel atherosclerosis. Nonobstructing bilateral renal calculi. Diverticular changes in the distal colon. Disc levels: L1-2: Chronic degenerative disc disease with loss of disc height, a chronic central disc extrusion with caudal migration and endplate osteophytes. Chronic left greater than right foraminal narrowing. L2-3: Chronic degenerative disc disease with loss of disc height, annular disc bulging and endplate osteophytes. Bilateral facet and ligamentous hypertrophy. Mild spinal stenosis with mild chronic foraminal narrowing bilaterally. L3-4: Progressive loss of disc height with annular disc bulging and endplate osteophytes. Advanced facet and ligamentous hypertrophy. Progressive spinal stenosis which appears severe. Associated least moderate lateral recess and foraminal narrowing bilaterally. L4-5: Solid interbody fusion without significant spinal stenosis. L5-S1: Status post right laminectomy and PLIF. Stable chronic mild right foraminal narrowing. IMPRESSION: 1. No acute findings identified in the thoracic or lumbar spine. 2. Previous L4-S1 fusion with solid interbody fusion and no recurrent spinal stenosis. 3. Progressive adjacent segment disease at L3-4 with progressive multifactorial spinal stenosis, severe. 4. Additional spondylosis throughout the thoracolumbar spine without significant spinal stenosis or large disc herniation. 5.  Aortic Atherosclerosis (ICD10-I70.0). Electronically Signed   By: Carey Bullocks M.D.   On: 08/02/2021 13:50   CT  Thoracic Spine Wo Contrast  Result Date: 08/02/2021 CLINICAL DATA:  Back trauma.  History of breast cancer. EXAM: CT THORACIC AND LUMBAR SPINE WITHOUT CONTRAST TECHNIQUE: Multidetector CT imaging of the thoracic and lumbar spine was performed without contrast. Multiplanar CT image reconstructions were also generated. RADIATION DOSE REDUCTION: This exam was performed according to the departmental dose-optimization program which includes automated exposure control, adjustment of the mA and/or kV according to patient size and/or use of iterative reconstruction technique. COMPARISON:  Chest and rib radiographs same date, PET-CT 10/06/2011, lumbar spine radiographs 01/25/2019 and lumbar MRI 11/29/2017. FINDINGS: CT THORACIC SPINE FINDINGS Alignment: Minimal convex left scoliosis. The lateral alignment is normal. Vertebrae: No evidence of acute fracture or traumatic subluxation. Multilevel spondylosis with disc space narrowing  and endplate osteophytes. Paraspinal and other soft tissues: No acute paraspinal findings are evident. Diffuse atherosclerosis of the aorta, great vessels and coronary arteries. Dependent atelectasis in both lungs. Disc levels: Multilevel spondylosis with small disc protrusions and endplate osteophytes. No large disc herniation or high-grade spinal stenosis identified. CT LUMBAR SPINE FINDINGS Segmentation: 5 lumbar type vertebral bodies. Alignment: S-shaped lumbar scoliosis, similar to previous MRI. The lateral alignment is within physiologic limits. Vertebrae: Status post laminectomy and PLIF at L5-S1. The hardware is intact without loosening. Solid interbody fusion at L4-5 and L5-S1. No evidence of acute fracture or pars defect. There is multilevel spondylosis. Paraspinal and other soft tissues: No acute paraspinal findings are seen. There is diffuse aortic and branch vessel atherosclerosis. Nonobstructing bilateral renal calculi. Diverticular changes in the distal colon. Disc levels: L1-2:  Chronic degenerative disc disease with loss of disc height, a chronic central disc extrusion with caudal migration and endplate osteophytes. Chronic left greater than right foraminal narrowing. L2-3: Chronic degenerative disc disease with loss of disc height, annular disc bulging and endplate osteophytes. Bilateral facet and ligamentous hypertrophy. Mild spinal stenosis with mild chronic foraminal narrowing bilaterally. L3-4: Progressive loss of disc height with annular disc bulging and endplate osteophytes. Advanced facet and ligamentous hypertrophy. Progressive spinal stenosis which appears severe. Associated least moderate lateral recess and foraminal narrowing bilaterally. L4-5: Solid interbody fusion without significant spinal stenosis. L5-S1: Status post right laminectomy and PLIF. Stable chronic mild right foraminal narrowing. IMPRESSION: 1. No acute findings identified in the thoracic or lumbar spine. 2. Previous L4-S1 fusion with solid interbody fusion and no recurrent spinal stenosis. 3. Progressive adjacent segment disease at L3-4 with progressive multifactorial spinal stenosis, severe. 4. Additional spondylosis throughout the thoracolumbar spine without significant spinal stenosis or large disc herniation. 5.  Aortic Atherosclerosis (ICD10-I70.0). Electronically Signed   By: Carey Bullocks M.D.   On: 08/02/2021 13:50   CT Shoulder Left Wo Contrast  Result Date: 08/02/2021 CLINICAL DATA:  Shoulder pain.  Stress fracture suspected. EXAM: CT OF THE UPPER LEFT EXTREMITY WITHOUT CONTRAST TECHNIQUE: Multidetector CT imaging of the upper left extremity was performed according to the standard protocol. RADIATION DOSE REDUCTION: This exam was performed according to the departmental dose-optimization program which includes automated exposure control, adjustment of the mA and/or kV according to patient size and/or use of iterative reconstruction technique. COMPARISON:  Left shoulder radiographs 08/02/2021  FINDINGS: Bones/Joint/Cartilage Severe superior glenohumeral joint space narrowing with bone-on-bone contact. Moderate inferior humeral head-neck junction and peripheral glenoid degenerative osteophytes. Mild-to-moderate glenoid subchondral degenerative cystic change. The humeral head is high-riding and contacts the undersurface of the acromion. There is moderate subcortical sclerosis and cystic change within the adjacent acromion consistent with chronic abutment with the humeral head. Minimal distal lateral acromion degenerative spurring. Mild acromioclavicular joint space narrowing and peripheral osteophytosis. Partially visualized severe multilevel degenerative disc changes of the cervical and thoracic spine. Ligaments Suboptimally assessed by CT. Muscles and Tendons Moderate supraspinatus and infraspinatus muscle atrophy. Soft tissues Incidental note of 5 mm low-density nodule within the left thyroid lobe. This is not clinically significant and no follow-up imaging is recommended. Moderate to high-grade atherosclerotic calcifications within the thoracic aorta. IMPRESSION: 1. Severe glenohumeral osteoarthritis. 2. No acute fracture is seen. 3. High-riding humeral head again abuts the undersurface of the acromion suggesting full-thickness superior rotator cuff tears. There is moderate atrophy of the supraspinatus and infraspinatus muscles consistent with chronic full-thickness tendon tears. Electronically Signed   By: Neita Garnet M.D.   On: 08/02/2021  13:45   CT Cervical Spine Wo Contrast  Result Date: 08/02/2021 CLINICAL DATA:  Neck trauma EXAM: CT CERVICAL SPINE WITHOUT CONTRAST TECHNIQUE: Multidetector CT imaging of the cervical spine was performed without intravenous contrast. Multiplanar CT image reconstructions were also generated. RADIATION DOSE REDUCTION: This exam was performed according to the departmental dose-optimization program which includes automated exposure control, adjustment of the mA  and/or kV according to patient size and/or use of iterative reconstruction technique. COMPARISON:  09/16/2007 FINDINGS: Alignment: Minimal anterolisthesis C7-T1 unchanged. Remaining alignments normal Skull base and vertebrae: Osseous demineralization. Visualized skull base intact. Vertebral body heights maintained. Multilevel disc space narrowing and endplate spur formation. Multilevel facet degenerative changes. No fracture, additional subluxation, or bone destruction. Soft tissues and spinal canal: Prevertebral soft tissues normal thickness. Atherosclerotic calcification of internal carotid and vertebral arteries at skull base as well as carotid bifurcations and proximal great vessels Disc levels:  Bulging disc at C2-C3. Upper chest: Lung apices clear Other: N/A IMPRESSION: Multilevel degenerative disc and facet disease changes of the cervical spine as above. No acute cervical spine abnormalities. Electronically Signed   By: Ulyses Southward M.D.   On: 08/02/2021 13:35   DG Shoulder Left  Result Date: 08/02/2021 CLINICAL DATA:  Fall with deformity. EXAM: LEFT SHOULDER - 2+ VIEW COMPARISON:  None Available. FINDINGS: Three view study. Bones are diffusely demineralized. No definite evidence for an acute fracture. No findings to suggest shoulder separation or dislocation. Degenerative changes are seen in the acromioclavicular joint, glenohumeral joint, and at the rotator cuff insertion. Loss of acromial humeral distance suggest chronic rotator cuff pathology. IMPRESSION: 1. No discernible fracture line in the proximal humerus although degenerative changes and osteopenia hinders assessment. CT imaging could be used to further evaluate as clinically warranted. Electronically Signed   By: Kennith Center M.D.   On: 08/02/2021 12:39   DG Ribs Bilateral W/Chest  Result Date: 08/02/2021 CLINICAL DATA:  Dizziness, pain EXAM: BILATERAL RIBS AND CHEST - 4+ VIEW COMPARISON:  None Available. FINDINGS: No fracture or other bone  lesions are seen involving the ribs. There is no evidence of pneumothorax or pleural effusion. Both lungs are clear. Cardiomegaly. IMPRESSION: 1. No displaced fracture or other radiographic abnormality of the ribs. 2.  Cardiomegaly. Electronically Signed   By: Jearld Lesch M.D.   On: 08/02/2021 12:34    Procedures Procedures   Medications Ordered in ED Medications  potassium chloride 10 mEq in 100 mL IVPB (10 mEq Intravenous New Bag/Given 08/02/21 1448)  sodium chloride 0.9 % bolus 1,000 mL (1,000 mLs Intravenous New Bag/Given 08/02/21 1358)  potassium chloride SA (KLOR-CON M) CR tablet 40 mEq (40 mEq Oral Given 08/02/21 1330)    ED Course/ Medical Decision Making/ A&P                           Medical Decision Making Amount and/or Complexity of Data Reviewed Labs: ordered. Radiology: ordered.  Risk Prescription drug management. Decision regarding hospitalization.   88 year old female presents to ED for evaluation.  Please see HPI for further details.  On examination, the patient is afebrile and nontachycardic with a pulse rate of 91.  Patient lung sounds clear bilaterally, patient nonhypoxic on room air.  Patient's abdomen is soft and compressible in all 4 quadrants.  The patient has no focal neurodeficits on examination.  Patient's left shoulder has no obvious deformity, overlying skin change however she does have decreased range of motion and tenderness to palpation.  Patient worked up utilizing the following labs and imaging studies interpreted by me personally: - BMP shows decreased potassium to 2.9.  This was repleted with 40 mEq of oral potassium as well as 10 mEq of IV potassium - CBC unremarkable - Troponin initially 17, delta troponin pending.  The patient denies any chest pain. - Magnesium pending - CT lumbar, thoracic, cervical spine showed no fracture, subluxation.  Patient states that she has had severe back pain for quite some time, there is findings of progressive  adjacent segment disease at L3-L4 with progressive multifactorial spinal stenosis which is described as severe. - CT scan of shoulder shows moderate atrophy of infraspinatus and supraspinatus muscles which is concerning for rotator cuff tear.  The patient has been placed in a shoulder sling.  Orthopedics was consulted and stated that this patient would be candidate for physical therapy as outpatient.  The patient will follow-up with Dr. Sherilyn Dacosta as an outpatient per Jamelle Rushing. - Patient EKG shows A-fib with rate control.  Patient denies history of A-fib.  Due to new onset A-fib, I believe that this patient would benefit from inpatient admission.  Hospitalist has been consulted for admission.  Dr. Ophelia Charter, hospitalist service, has agreed to admit the patient for observation.  The patient is amenable to the plan.  The patient is stable at the time of admission.  Final Clinical Impression(s) / ED Diagnoses Final diagnoses:  Dizziness  Atrial fibrillation, unspecified type (HCC)  Traumatic tear of left rotator cuff, unspecified tear extent, initial encounter    Rx / DC Orders ED Discharge Orders     None             Al Decant, PA-C 08/02/21 1735    Pricilla Loveless, MD 08/03/21 [phone]

## 2021-08-02 NOTE — H&P (Signed)
History and Physical    Patient: Judy [name] DXI:[id number] DOB: 04/05/1930 DOA: 08/02/2021 DOS: the patient was seen and examined on 08/02/2021 PCP: Mayra Neer, MD  Patient coming from: Home - lives alone, husband died on 2022/07/07; NOK: Nephew, doesn't know number   Chief Complaint: Dizziness, fall  HPI: Judy [name] is a 88 y.o. female with medical history significant of remote breast cancer; HTN; and HLD presenting with dizziness, fall.  Her shoulder isn't letting her sleep and she got up and fell in the floor.  Her shoulder is hurting because she picked up a big bag of mulch.  No h/o afib.  She thinks she eats ok, but her family tells her she doesn't eat and drink enough.    ER Course:  New-onset afib + hypokalemia.  Also with rotator cuff tear after moving mulch bag.  Sitting -> standing, fell onto L shoulder.  Has rotator cuff tear, ortho to see, in sling.  Lives independently.  Decreased PO.  Has new afib.  Dizziness.  Checking Mag++.       Review of Systems: As mentioned in the history of present illness. All other systems reviewed and are negative. Past Medical History:  Diagnosis Date   Blood transfusion    Breast cancer, left ([place]) 09/26/2011   Punch biopsies of the left nipple done on 09/16/2011.  ER and PR were negative.  HER2 is positive with ratio 4.68.Had mastectomy on 10/25/11. Path T1,N0.    Chronic low back pain    Dyslipidemia    GERD (gastroesophageal reflux disease)    HTN (hypertension)    Osteoarthritis    Osteoporosis    Past Surgical History:  Procedure Laterality Date   BACK SURGERY  2009   lumb lam   BREAST SURGERY  2009   lt lumpectomy-plus 2 re-excisions for margins   left breast mastectomy  2013   MASTECTOMY Left    ROTATOR CUFF REPAIR     Bilateral, 2010   TOTAL ABDOMINAL HYSTERECTOMY     Social History:  reports that she has never smoked. She has never used smokeless tobacco. She reports that she does not drink alcohol and does not use  drugs.  Allergies  Allergen Reactions   Amoxicillin Nausea Only   Codeine Nausea Only   Amlodipine Swelling   Aricept [Donepezil Hcl] Other (See Comments)    Felt drunk and headache   Clonidine Derivatives Other (See Comments)    Mouth dryness   Sertraline Nausea Only   Clavulanic Acid Hives and Rash    Family History  Problem Relation Age of Onset   Cancer Sister        breast   Breast cancer Sister    Colon cancer Brother    Cancer Sister        lung   Diabetes Son    Esophageal cancer Neg Hx    Rectal cancer Neg Hx    Stomach cancer Neg Hx     Prior to Admission medications   Medication Sig Start Date End Date Taking? Authorizing Provider  amLODipine (NORVASC) 5 MG tablet Take 1 tablet (5 mg total) by mouth daily. 12/06/12   Jettie Booze, MD  cyclobenzaprine (FLEXERIL) 10 MG tablet Take 10 mg by mouth 3 (three) times daily as needed for muscle spasms.    [provider]  hydrochlorothiazide (HYDRODIURIL) 25 MG tablet Take 25 mg by mouth daily.    [provider]  lisinopril (PRINIVIL,ZESTRIL) 40 MG tablet Take 40  mg by mouth daily.    [provider]  metoprolol succinate (TOPROL-XL) 100 MG 24 hr tablet Take 100 mg by mouth daily. Take with or immediately following a meal.    [provider]  Multiple Vitamin (MULTIVITAMIN) capsule Take 1 capsule by mouth daily.    [provider]  nabumetone (RELAFEN) 500 MG tablet Take 500 mg by mouth 2 (two) times daily as needed for pain.    [provider]  potassium chloride (K-DUR,KLOR-CON) 10 MEQ tablet Take 10 mEq by mouth 2 (two) times daily.    [provider]  psyllium (METAMUCIL SMOOTH TEXTURE) 28 % packet Take 1 packet by mouth 2 (two) times daily.    [provider]  simvastatin (ZOCOR) 20 MG tablet Take 20 mg by mouth every evening.    [provider]    Physical Exam: Vitals:   08/02/21 1119 08/02/21 1400 08/02/21 1530 08/02/21  1600  BP: (!) 166/96 (!) 182/115 (!) 159/121   Pulse: 91 100 (!) 101 (!) 108  Resp: 17 16    Temp: 97.6 F (36.4 C)     TempSrc: Oral     SpO2: 96% 98% 98% 99%  Weight:      Height:       General:  Appears calm and comfortable and is in NAD Eyes:  PERRL, EOMI, normal lids, iris ENT:  grossly normal hearing, lips & tongue, mmm Neck:  no LAD, masses or thyromegaly Cardiovascular:  Irregularly irregular without bradycardia; no m/r/g. 1+ LE edema.  Respiratory:   CTA bilaterally with no wheezes/rales/rhonchi.  Normal respiratory effort. Abdomen:  soft, NT, ND Skin:  no rash or induration seen on limited exam Musculoskeletal:  R shoulder is in a sling Psychiatric:  grossly normal mood and affect, speech fluent and appropriate, AOx3 Neurologic:  CN 2-12 grossly intact, moves all extremities in coordinated fashion other than RUE   Radiological Exams on Admission: Independently reviewed - see discussion in A/P where applicable  CT Lumbar Spine Wo Contrast  Result Date: 08/02/2021 CLINICAL DATA:  Back trauma.  History of breast cancer. EXAM: CT THORACIC AND LUMBAR SPINE WITHOUT CONTRAST TECHNIQUE: Multidetector CT imaging of the thoracic and lumbar spine was performed without contrast. Multiplanar CT image reconstructions were also generated. RADIATION DOSE REDUCTION: This exam was performed according to the departmental dose-optimization program which includes automated exposure control, adjustment of the mA and/or kV according to patient size and/or use of iterative reconstruction technique. COMPARISON:  Chest and rib radiographs same date, PET-CT 10/06/2011, lumbar spine radiographs 01/25/2019 and lumbar MRI 11/29/2017. FINDINGS: CT THORACIC SPINE FINDINGS Alignment: Minimal convex left scoliosis. The lateral alignment is normal. Vertebrae: No evidence of acute fracture or traumatic subluxation. Multilevel spondylosis with disc space narrowing and endplate osteophytes. Paraspinal and other  soft tissues: No acute paraspinal findings are evident. Diffuse atherosclerosis of the aorta, great vessels and coronary arteries. Dependent atelectasis in both lungs. Disc levels: Multilevel spondylosis with small disc protrusions and endplate osteophytes. No large disc herniation or high-grade spinal stenosis identified. CT LUMBAR SPINE FINDINGS Segmentation: 5 lumbar type vertebral bodies. Alignment: S-shaped lumbar scoliosis, similar to previous MRI. The lateral alignment is within physiologic limits. Vertebrae: Status post laminectomy and PLIF at L5-S1. The hardware is intact without loosening. Solid interbody fusion at L4-5 and L5-S1. No evidence of acute fracture or pars defect. There is multilevel spondylosis. Paraspinal and other soft tissues: No acute paraspinal findings are seen. There is diffuse aortic and branch vessel atherosclerosis. Nonobstructing  bilateral renal calculi. Diverticular changes in the distal colon. Disc levels: L1-2: Chronic degenerative disc disease with loss of disc height, a chronic central disc extrusion with caudal migration and endplate osteophytes. Chronic left greater than right foraminal narrowing. L2-3: Chronic degenerative disc disease with loss of disc height, annular disc bulging and endplate osteophytes. Bilateral facet and ligamentous hypertrophy. Mild spinal stenosis with mild chronic foraminal narrowing bilaterally. L3-4: Progressive loss of disc height with annular disc bulging and endplate osteophytes. Advanced facet and ligamentous hypertrophy. Progressive spinal stenosis which appears severe. Associated least moderate lateral recess and foraminal narrowing bilaterally. L4-5: Solid interbody fusion without significant spinal stenosis. L5-S1: Status post right laminectomy and PLIF. Stable chronic mild right foraminal narrowing. IMPRESSION: 1. No acute findings identified in the thoracic or lumbar spine. 2. Previous L4-S1 fusion with solid interbody fusion and no  recurrent spinal stenosis. 3. Progressive adjacent segment disease at L3-4 with progressive multifactorial spinal stenosis, severe. 4. Additional spondylosis throughout the thoracolumbar spine without significant spinal stenosis or large disc herniation. 5.  Aortic Atherosclerosis (ICD10-I70.0). Electronically Signed   By: Richardean Sale M.D.   On: 08/02/2021 13:50   CT Thoracic Spine Wo Contrast  Result Date: 08/02/2021 CLINICAL DATA:  Back trauma.  History of breast cancer. EXAM: CT THORACIC AND LUMBAR SPINE WITHOUT CONTRAST TECHNIQUE: Multidetector CT imaging of the thoracic and lumbar spine was performed without contrast. Multiplanar CT image reconstructions were also generated. RADIATION DOSE REDUCTION: This exam was performed according to the departmental dose-optimization program which includes automated exposure control, adjustment of the mA and/or kV according to patient size and/or use of iterative reconstruction technique. COMPARISON:  Chest and rib radiographs same date, PET-CT 10/06/2011, lumbar spine radiographs 01/25/2019 and lumbar MRI 11/29/2017. FINDINGS: CT THORACIC SPINE FINDINGS Alignment: Minimal convex left scoliosis. The lateral alignment is normal. Vertebrae: No evidence of acute fracture or traumatic subluxation. Multilevel spondylosis with disc space narrowing and endplate osteophytes. Paraspinal and other soft tissues: No acute paraspinal findings are evident. Diffuse atherosclerosis of the aorta, great vessels and coronary arteries. Dependent atelectasis in both lungs. Disc levels: Multilevel spondylosis with small disc protrusions and endplate osteophytes. No large disc herniation or high-grade spinal stenosis identified. CT LUMBAR SPINE FINDINGS Segmentation: 5 lumbar type vertebral bodies. Alignment: S-shaped lumbar scoliosis, similar to previous MRI. The lateral alignment is within physiologic limits. Vertebrae: Status post laminectomy and PLIF at L5-S1. The hardware is intact  without loosening. Solid interbody fusion at L4-5 and L5-S1. No evidence of acute fracture or pars defect. There is multilevel spondylosis. Paraspinal and other soft tissues: No acute paraspinal findings are seen. There is diffuse aortic and branch vessel atherosclerosis. Nonobstructing bilateral renal calculi. Diverticular changes in the distal colon. Disc levels: L1-2: Chronic degenerative disc disease with loss of disc height, a chronic central disc extrusion with caudal migration and endplate osteophytes. Chronic left greater than right foraminal narrowing. L2-3: Chronic degenerative disc disease with loss of disc height, annular disc bulging and endplate osteophytes. Bilateral facet and ligamentous hypertrophy. Mild spinal stenosis with mild chronic foraminal narrowing bilaterally. L3-4: Progressive loss of disc height with annular disc bulging and endplate osteophytes. Advanced facet and ligamentous hypertrophy. Progressive spinal stenosis which appears severe. Associated least moderate lateral recess and foraminal narrowing bilaterally. L4-5: Solid interbody fusion without significant spinal stenosis. L5-S1: Status post right laminectomy and PLIF. Stable chronic mild right foraminal narrowing. IMPRESSION: 1. No acute findings identified in the thoracic or lumbar spine. 2. Previous L4-S1 fusion with solid interbody fusion and  no recurrent spinal stenosis. 3. Progressive adjacent segment disease at L3-4 with progressive multifactorial spinal stenosis, severe. 4. Additional spondylosis throughout the thoracolumbar spine without significant spinal stenosis or large disc herniation. 5.  Aortic Atherosclerosis (ICD10-I70.0). Electronically Signed   By: Richardean Sale M.D.   On: 08/02/2021 13:50   CT Shoulder Left Wo Contrast  Result Date: 08/02/2021 CLINICAL DATA:  Shoulder pain.  Stress fracture suspected. EXAM: CT OF THE UPPER LEFT EXTREMITY WITHOUT CONTRAST TECHNIQUE: Multidetector CT imaging of the upper  left extremity was performed according to the standard protocol. RADIATION DOSE REDUCTION: This exam was performed according to the departmental dose-optimization program which includes automated exposure control, adjustment of the mA and/or kV according to patient size and/or use of iterative reconstruction technique. COMPARISON:  Left shoulder radiographs 08/02/2021 FINDINGS: Bones/Joint/Cartilage Severe superior glenohumeral joint space narrowing with bone-on-bone contact. Moderate inferior humeral head-neck junction and peripheral glenoid degenerative osteophytes. Mild-to-moderate glenoid subchondral degenerative cystic change. The humeral head is high-riding and contacts the undersurface of the acromion. There is moderate subcortical sclerosis and cystic change within the adjacent acromion consistent with chronic abutment with the humeral head. Minimal distal lateral acromion degenerative spurring. Mild acromioclavicular joint space narrowing and peripheral osteophytosis. Partially visualized severe multilevel degenerative disc changes of the cervical and thoracic spine. Ligaments Suboptimally assessed by CT. Muscles and Tendons Moderate supraspinatus and infraspinatus muscle atrophy. Soft tissues Incidental note of 5 mm low-density nodule within the left thyroid lobe. This is not clinically significant and no follow-up imaging is recommended. Moderate to high-grade atherosclerotic calcifications within the thoracic aorta. IMPRESSION: 1. Severe glenohumeral osteoarthritis. 2. No acute fracture is seen. 3. High-riding humeral head again abuts the undersurface of the acromion suggesting full-thickness superior rotator cuff tears. There is moderate atrophy of the supraspinatus and infraspinatus muscles consistent with chronic full-thickness tendon tears. Electronically Signed   By: Yvonne Kendall M.D.   On: 08/02/2021 13:45   CT Cervical Spine Wo Contrast  Result Date: 08/02/2021 CLINICAL DATA:  Neck trauma  EXAM: CT CERVICAL SPINE WITHOUT CONTRAST TECHNIQUE: Multidetector CT imaging of the cervical spine was performed without intravenous contrast. Multiplanar CT image reconstructions were also generated. RADIATION DOSE REDUCTION: This exam was performed according to the departmental dose-optimization program which includes automated exposure control, adjustment of the mA and/or kV according to patient size and/or use of iterative reconstruction technique. COMPARISON:  09/16/2007 FINDINGS: Alignment: Minimal anterolisthesis C7-T1 unchanged. Remaining alignments normal Skull base and vertebrae: Osseous demineralization. Visualized skull base intact. Vertebral body heights maintained. Multilevel disc space narrowing and endplate spur formation. Multilevel facet degenerative changes. No fracture, additional subluxation, or bone destruction. Soft tissues and spinal canal: Prevertebral soft tissues normal thickness. Atherosclerotic calcification of internal carotid and vertebral arteries at skull base as well as carotid bifurcations and proximal great vessels Disc levels:  Bulging disc at C2-C3. Upper chest: Lung apices clear Other: N/A IMPRESSION: Multilevel degenerative disc and facet disease changes of the cervical spine as above. No acute cervical spine abnormalities. Electronically Signed   By: Lavonia Dana M.D.   On: 08/02/2021 13:35   DG Shoulder Left  Result Date: 08/02/2021 CLINICAL DATA:  Fall with deformity. EXAM: LEFT SHOULDER - 2+ VIEW COMPARISON:  None Available. FINDINGS: Three view study. Bones are diffusely demineralized. No definite evidence for an acute fracture. No findings to suggest shoulder separation or dislocation. Degenerative changes are seen in the acromioclavicular joint, glenohumeral joint, and at the rotator cuff insertion. Loss of acromial humeral distance suggest chronic rotator  cuff pathology. IMPRESSION: 1. No discernible fracture line in the proximal humerus although degenerative  changes and osteopenia hinders assessment. CT imaging could be used to further evaluate as clinically warranted. Electronically Signed   By: Misty Stanley M.D.   On: 08/02/2021 12:39   DG Ribs Bilateral W/Chest  Result Date: 08/02/2021 CLINICAL DATA:  Dizziness, pain EXAM: BILATERAL RIBS AND CHEST - 4+ VIEW COMPARISON:  None Available. FINDINGS: No fracture or other bone lesions are seen involving the ribs. There is no evidence of pneumothorax or pleural effusion. Both lungs are clear. Cardiomegaly. IMPRESSION: 1. No displaced fracture or other radiographic abnormality of the ribs. 2.  Cardiomegaly. Electronically Signed   By: Delanna Ahmadi M.D.   On: 08/02/2021 12:34    EKG: Independently reviewed.  Afib with rate 94; prolonged QTc 542; bifascicular block; nonspecific ST changes    Labs on Admission: I have personally reviewed the available labs and imaging studies at the time of the admission.  Pertinent labs:    K+ 2.9 Glucose 115 HS troponin 17, 20 Normal CBC   Assessment and Plan: Principal Problem:   New onset atrial fibrillation [facility]) Active Problems:   Fall at home, initial encounter   Hypokalemia   Rotator cuff tear arthropathy, right   HTN (hypertension)   Dyslipidemia   Chronic back pain   DNR (do not resuscitate)    New Onset Afib -Patient presenting with a fall, found to have new-onset afib.  -Since the afib onset is unknown, will focus on rate control  -She is rate controlled and does not appear to need progressive care admission for drip -HS troponin negative initially with mildly elevated repeat but negative delta  -Will request Echocardiogram for further evaluation  -Heart rate is well controlled with metoprolol.  She usually takes amlodipine but will stop this and change to diltiazem PO. -CHA2DS2-VASc Score is >2 and so patient would benefit from oral anticoagulation. There is evidence of net benefit even in the extremely elderly population.  However, she fell  today and appears to be at high ongoing fall risk so will hold for now.  Fall -Reported h/o dizziness although patient does not report this - she says she wasn't sleeping due to shoulder pain and stumbled overnight -Regardless, fall risk is concerning  -Will hold AC for now -PT/OT consults  Hypokalemia -Marked hypokalemia -Likely related to HCTZ and poor PO intake -Normal Mag++ level -Repleting in the ER appropriately -Will hold HCTZ and consider stopping -Will monitor overnight on telemetry  Rotator cuff tear -Patient has been lifting heavy things since her husband died -Imaging today indicates rotator cuff tear -PT is generally first line for this -Sling in place -PT/OT consults  HTN -Continue lisinopril, metoprolol -Hold amlodipine and HCTZ, as above -Start short-acting Diltiazem and consider transitioning to long-acting at time of dc (assuming normal EF on echo) -Will also add prn hydralazine  HLD -Continue Zocor  Chronic back pain -Prior fusion -Imaging today indicates severe disease adjacent to fusion -Consider neurosurgery consult as outpatient  Possible malnutrition -Family has voiced concerns about poor PO intake -Will order nutrition consult  DNR -I have discussed code status with the patient and she would not desire resuscitation and would prefer to die a natural death should that situation arise. -She will need a gold out of facility DNR form at the time of discharge     Advance Care Planning:   Code Status: DNR   Consults: PT/OT; TOC team; nutrition  DVT Prophylaxis: Lovenox  Family Communication: None present; family was present prior to my evaluation and are returning - she did not request that I contact them  Severity of Illness: The appropriate patient status for this patient is OBSERVATION. Observation status is judged to be reasonable and necessary in order to provide the required intensity of service to ensure the patient's safety. The  patient's presenting symptoms, physical exam findings, and initial radiographic and laboratory data in the context of their medical condition is felt to place them at decreased risk for further clinical deterioration. Furthermore, it is anticipated that the patient will be medically stable for discharge from the hospital within 2 midnights of admission.   Author: Karmen Bongo, MD 08/02/2021 4:35 PM  For on call review [email or web address].

## 2021-08-02 NOTE — ED Triage Notes (Signed)
Patient has had decreased fluid intake and oral intake since husband died last week.  Patient complains of dizziness x 2 days and went to stand up today got dizzy and fell.  Complains of back pain and left shoulder pain and deformity.  Denies urinary symptoms or blood in stool.  Reports she had some left shoulder pain yesterday prior to the fall.

## 2021-08-02 NOTE — ED Notes (Signed)
Pt contacts updated. Husband Lizaola, Kayleen Memos) is deceased, phone number has been inactivated.

## 2021-08-02 NOTE — ED Notes (Signed)
Pt ambulated to restroom with a steady gait

## 2021-08-02 NOTE — Progress Notes (Addendum)
Talked with patient's daughter in law Davida Falconi about pt. Pt recently lost son February 2023 and husband April 2023. Daughter in law expressed concerns about pt being more forgetful and has progressed after family death's this year. Daughter in law also gave extensive medical history about as well. Pt gave permission to talk with daughter in law about pt's care.

## 2021-08-03 ENCOUNTER — Observation Stay (HOSPITAL_BASED_OUTPATIENT_CLINIC_OR_DEPARTMENT_OTHER): Payer: Medicare Other

## 2021-08-03 DIAGNOSIS — I4891 Unspecified atrial fibrillation: Secondary | ICD-10-CM

## 2021-08-03 LAB — ECHOCARDIOGRAM COMPLETE
AR max vel: 1.43 cm2
AV Area VTI: 1.44 cm2
AV Area mean vel: 1.34 cm2
AV Mean grad: 3 mmHg
AV Peak grad: 6.2 mmHg
Ao pk vel: 1.24 m/s
Calc EF: 42.7 %
Height: 62 in
S' Lateral: 2.7 cm
Single Plane A2C EF: 40.4 %
Single Plane A4C EF: 47.3 %
Weight: 2320 oz

## 2021-08-03 LAB — BASIC METABOLIC PANEL
Anion gap: 11 (ref 5–15)
BUN: 8 mg/dL (ref 8–23)
CO2: 26 mmol/L (ref 22–32)
Calcium: 9.6 mg/dL (ref 8.9–10.3)
Chloride: 102 mmol/L (ref 98–111)
Creatinine, Ser: 0.65 mg/dL (ref 0.44–1.00)
GFR, Estimated: >60 mL/min (ref >60)
Glucose, Bld: 119 mg/dL — ABNORMAL HIGH (ref 70–99)
Potassium: 3 mmol/L — ABNORMAL LOW (ref 3.5–5.1)
Sodium: 139 mmol/L (ref 135–145)

## 2021-08-03 LAB — CBC
HCT: 39.5 % (ref 36.0–46.0)
Hemoglobin: 13.3 g/dL (ref 12.0–15.0)
MCH: 30.6 pg (ref 26.0–34.0)
MCHC: 33.7 g/dL (ref 30.0–36.0)
MCV: 91 fL (ref 80.0–100.0)
Platelets: 238 10*3/uL (ref 150–400)
RBC: 4.34 MIL/uL (ref 3.87–5.11)
RDW: 13.9 % (ref 11.5–15.5)
WBC: 6.3 10*3/uL (ref 4.0–10.5)
nRBC: 0 % (ref 0.0–0.2)

## 2021-08-03 LAB — TSH: TSH: 1.298 u[IU]/mL (ref 0.350–4.500)

## 2021-08-03 MED ORDER — DILTIAZEM HCL ER COATED BEADS 120 MG PO CP24: 120.0000 mg | ORAL_CAPSULE | Freq: Every day | ORAL | 1 refills | Status: DC

## 2021-08-03 MED ORDER — POTASSIUM CHLORIDE CRYS ER 20 MEQ PO TBCR
40.0000 meq | EXTENDED_RELEASE_TABLET | Freq: Once | ORAL | Status: AC
  Administered 2021-08-03: 40 meq via ORAL
  Filled 2021-08-03: qty 2

## 2021-08-03 MED ORDER — ATORVASTATIN CALCIUM 10 MG PO TABS: 10.0000 mg | ORAL_TABLET | Freq: Every day | ORAL | 1 refills | Status: DC

## 2021-08-03 NOTE — Discharge Summary (Signed)
Physician Discharge Summary  Judy [name] KNL:[id number] DOB: 08/25/1930 DOA: 08/02/2021  PCP: Mayra Neer, MD  Admit date: 08/02/2021 Discharge date: 08/03/2021  Admitted From: Home Disposition: Home  Recommendations for Outpatient Follow-up:  Follow up with PCP in 1-2 weeks Follow-up with physical therapy as scheduled  Home Health: PT OT Equipment/Devices: No new equipment  Discharge Condition: Stable CODE STATUS: DNR Diet recommendation: As tolerated  Brief/Interim Summary: Patient is a pleasant 88 year old female with medical history of remote breast cancer hypertension hyperlipidemia presenting after a fall.  Patient lives alone at home has likely some undiagnosed moderate dementia per family.  Imaging at intake shows rotator cuff tear, orthopedic surgery recommending ongoing PT which will be set up in the outpatient setting.  Incidentally patient had notable A-fib on exam, this is deemed to be new onset, does not appear to be provoked and certainly is not symptomatic as it is currently rate controlled off medications.  Her CHA2DS2-VASc is greater than 2 but given her fall risk advanced age we discussed patient would be high risk for bleeding and thus anticoagulation was not initiated.  Patient otherwise stable and agreeable for discharge home, family to follow along, recommended improved p.o. intake in the setting of dehydration and hypokalemia as noted by labs but otherwise patient is asymptomatic other chronic comorbid conditions appear to be stable at current baseline.   Discharge Diagnoses:  Principal Problem:   New onset atrial fibrillation [facility]) Active Problems:   Fall at home, initial encounter   Hypokalemia   Rotator cuff tear arthropathy, right   HTN (hypertension)   Dyslipidemia   Chronic back pain   DNR (do not resuscitate)    Discharge Instructions  Discharge Instructions     Amb referral to [facility]   Complete by: As directed    Discharge patient    Complete by: As directed    Discharge disposition: 06-Home-Health Care Svc   Discharge patient date: 08/03/2021   Face-to-face encounter (required for Medicare/Medicaid patients)   Complete by: As directed    [place] certify that this patient is under my care and that I, or a nurse practitioner or physician's assistant working with me, had a face-to-face encounter that meets the physician face-to-face encounter requirements with this patient on 08/03/2021. The encounter with the patient was in whole, or in part for the following medical condition(s) which is the primary reason for home health care (List medical condition): Ambulatory dysfunction, frequent falls   The encounter with the patient was in whole, or in part, for the following medical condition, which is the primary reason for home health care: Ambulatory dysfunction, frequent falls   I certify that, based on my findings, the following services are medically necessary home health services: Physical therapy   Reason for Medically Necessary Home Health Services:  Therapy- Personnel officer, Public librarian Therapy- Instruction on use of Assistive Device for Ambulation on all Surfaces     My clinical findings support the need for the above services:  Unable to leave home safely without assistance and/or assistive device Cognitive impairments, dementia, or mental confusion  that make it unsafe to leave home     Further, I certify that my clinical findings support that this patient is homebound due to:  Unable to leave home safely without assistance Mental confusion     Home Health   Complete by: As directed    To provide the following care/treatments:  PT OT  Allergies as of 08/03/2021       Reactions   Amoxicillin Nausea Only   Codeine Nausea Only   Amlodipine Swelling   Aricept [donepezil Hcl] Other (See Comments)   Felt drunk and headache   Clonidine Derivatives Other (See Comments)    Mouth dryness   Sertraline Nausea Only   Clavulanic Acid Hives, Rash        Medication List     STOP taking these medications    amLODipine 5 MG tablet Commonly known as: NORVASC   diphenhydramine-acetaminophen 25-500 MG Tabs tablet Commonly known as: TYLENOL PM   hydrochlorothiazide 25 MG tablet Commonly known as: HYDRODIURIL   simvastatin 20 MG tablet Commonly known as: ZOCOR       TAKE these medications    atorvastatin 10 MG tablet Commonly known as: LIPITOR Take 1 tablet (10 mg total) by mouth daily at 6 PM. Notes to patient: 08/04/2021   diltiazem 120 MG 24 hr capsule Commonly known as: Cardizem CD Take 1 capsule (120 mg total) by mouth daily. Notes to patient: Next dose 08/04/21   lisinopril 40 MG tablet Commonly known as: ZESTRIL Take 40 mg by mouth daily. Notes to patient: Next dose 08/04/21   metoprolol succinate 100 MG 24 hr tablet Commonly known as: TOPROL-XL Take 100 mg by mouth daily. Take with or immediately following a meal. Notes to patient: 08/04/2021   multivitamin capsule Take 1 capsule by mouth daily. Notes to patient: Next dose 08/04/21   potassium chloride 10 MEQ tablet Commonly known as: KLOR-CON M Take 10 mEq by mouth daily. Notes to patient: Next dose 08/04/21   SYSTANE OP Apply 1 drop to eye every morning. Notes to patient: Next dose 08/04/2021        Follow-up Information     Care, [facility] Follow up.   Specialty: Home Health Services Contact information: [address] [place] [address] [phone]                Allergies  Allergen Reactions   Amoxicillin Nausea Only   Codeine Nausea Only   Amlodipine Swelling   Aricept [Donepezil Hcl] Other (See Comments)    Felt drunk and headache   Clonidine Derivatives Other (See Comments)    Mouth dryness   Sertraline Nausea Only   Clavulanic Acid Hives and Rash    Consultations: None   Procedures/Studies: ECHOCARDIOGRAM  COMPLETE  Result Date: 08/03/2021    ECHOCARDIOGRAM REPORT   Patient Name:   Judy [name] Date of Exam: 08/03/2021 Medical Rec #:  [id number]    Height:       62.0 in Accession #:    [id number]   Weight:       145.0 lb Date of Birth:  10-23-1930    BSA:          1.667 m Patient Age:    88 years     BP:           135/106 mmHg Patient Gender: F            HR:           85 bpm. Exam Location:  Inpatient Procedure: 2D Echo, Cardiac Doppler and Color Doppler Indications:    Atrial fibrillation  History:        Patient has no prior history of Echocardiogram examinations.                 Risk Factors:Dyslipidemia and Hypertension. Hx breast cancer,  GERD.  Sonographer:    Clayton Lefort RDCS (AE) Referring Phys: [place]  1. Left ventricular ejection fraction, by estimation, is 40 to 45%. The left ventricle has mildly decreased function. The left ventricle demonstrates global hypokinesis. There is moderate concentric left ventricular hypertrophy. Left ventricular diastolic parameters are indeterminate. Regional wall thickness 0.72, E/e' 9.  2. Right ventricular systolic function is normal. The right ventricular size is normal. There is mildly elevated pulmonary artery systolic pressure. The estimated right ventricular systolic pressure is 16.1 mmHg.  3. Left atrial size was moderately dilated.  4. The mitral valve is degenerative. Trivial mitral valve regurgitation. Mitral valve area by continuity equation 1.95 cm2  5. Tricuspid valve regurgitation is moderate.  6. The aortic valve is tricuspid. There is mild calcification of the aortic valve. There is moderate thickening of the aortic valve. Aortic valve regurgitation is not visualized. Aortic valve sclerosis is present, with no evidence of aortic valve stenosis. Aortic valve mean gradient measures 3.0 mmHg. Comparison(s): No prior Echocardiogram. Conclusion(s)/Recommendation(s): Notable hypertrophy with left atrial dilation, left atrial  enlargement. Outpatient Pyrophosphate imaging could be considered. FINDINGS  Left Ventricle: RWT 0.72, E/E' 9 in the setting of atrial fibrillation. Left ventricular ejection fraction, by estimation, is 40 to 45%. The left ventricle has mildly decreased function. The left ventricle demonstrates global hypokinesis. The left ventricular internal cavity size was small. There is moderate concentric left ventricular hypertrophy. Left ventricular diastolic parameters are indeterminate. Right Ventricle: The right ventricular size is normal. No increase in right ventricular wall thickness. Right ventricular systolic function is normal. There is mildly elevated pulmonary artery systolic pressure. The tricuspid regurgitant velocity is 3.21  m/s, and with an assumed right atrial pressure of 3 mmHg, the estimated right ventricular systolic pressure is 09.6 mmHg. Left Atrium: Left atrial size was moderately dilated. Right Atrium: Right atrial size was normal in size. Pericardium: There is no evidence of pericardial effusion. Mitral Valve: The mitral valve is degenerative in appearance. Mild mitral annular calcification. Trivial mitral valve regurgitation. Tricuspid Valve: The tricuspid valve is normal in structure. Tricuspid valve regurgitation is moderate . No evidence of tricuspid stenosis. Aortic Valve: The aortic valve is tricuspid. There is mild calcification of the aortic valve. There is moderate thickening of the aortic valve. Aortic valve regurgitation is not visualized. Aortic valve sclerosis is present, with no evidence of aortic valve stenosis. Aortic valve mean gradient measures 3.0 mmHg. Aortic valve peak gradient measures 6.2 mmHg. Aortic valve area, by VTI measures 1.44 cm. Pulmonic Valve: The pulmonic valve was normal in structure. Pulmonic valve regurgitation is not visualized. Aorta: The aortic root and ascending aorta are structurally normal, with no evidence of dilitation. Pulmonary Artery: The pulmonary  artery is of normal size. IAS/Shunts: No atrial level shunt detected by color flow Doppler.  LEFT VENTRICLE PLAX 2D LVIDd:         3.30 cm LVIDs:         2.70 cm LV PW:         1.20 cm LV IVS:        1.40 cm LVOT diam:     1.90 cm LV SV:         31 LV SV Index:   19 LVOT Area:     2.84 cm  LV Volumes (MOD) LV vol d, MOD A2C: 31.2 ml LV vol d, MOD A4C: 45.2 ml LV vol s, MOD A2C: 18.6 ml LV vol s, MOD A4C: 23.8 ml LV SV  MOD A2C:     12.6 ml LV SV MOD A4C:     45.2 ml LV SV MOD BP:      16.3 ml RIGHT VENTRICLE             IVC RV Basal diam:  3.70 cm     IVC diam: 1.80 cm RV Mid diam:    2.40 cm RV S prime:     11.10 cm/s TAPSE (M-mode): 1.8 cm LEFT ATRIUM             Index        RIGHT ATRIUM           Index LA diam:        3.70 cm 2.22 cm/m   RA Area:     19.60 cm LA Vol (A2C):   62.8 ml 37.66 ml/m  RA Volume:   50.80 ml  30.47 ml/m LA Vol (A4C):   68.1 ml 40.84 ml/m LA Biplane Vol: 68.0 ml 40.78 ml/m  AORTIC VALVE AV Area (Vmax):    1.43 cm AV Area (Vmean):   1.34 cm AV Area (VTI):     1.44 cm AV Vmax:           124.00 cm/s AV Vmean:          89.300 cm/s AV VTI:            0.219 m AV Peak Grad:      6.2 mmHg AV Mean Grad:      3.0 mmHg LVOT Vmax:         62.70 cm/s LVOT Vmean:        42.200 cm/s LVOT VTI:          0.111 m LVOT/AV VTI ratio: 0.51  AORTA Ao Root diam: 2.90 cm Ao Asc diam:  2.80 cm TRICUSPID VALVE TR Peak grad:   41.2 mmHg TR Vmax:        321.00 cm/s  SHUNTS Systemic VTI:  0.11 m Systemic Diam: 1.90 cm Rudean Haskell MD Electronically signed by Rudean Haskell MD Signature Date/Time: 08/03/2021/10:46:52 AM    Final    CT Lumbar Spine Wo Contrast  Result Date: 08/02/2021 CLINICAL DATA:  Back trauma.  History of breast cancer. EXAM: CT THORACIC AND LUMBAR SPINE WITHOUT CONTRAST TECHNIQUE: Multidetector CT imaging of the thoracic and lumbar spine was performed without contrast. Multiplanar CT image reconstructions were also generated. RADIATION DOSE REDUCTION: This exam was  performed according to the departmental dose-optimization program which includes automated exposure control, adjustment of the mA and/or kV according to patient size and/or use of iterative reconstruction technique. COMPARISON:  Chest and rib radiographs same date, PET-CT 10/06/2011, lumbar spine radiographs 01/25/2019 and lumbar MRI 11/29/2017. FINDINGS: CT THORACIC SPINE FINDINGS Alignment: Minimal convex left scoliosis. The lateral alignment is normal. Vertebrae: No evidence of acute fracture or traumatic subluxation. Multilevel spondylosis with disc space narrowing and endplate osteophytes. Paraspinal and other soft tissues: No acute paraspinal findings are evident. Diffuse atherosclerosis of the aorta, great vessels and coronary arteries. Dependent atelectasis in both lungs. Disc levels: Multilevel spondylosis with small disc protrusions and endplate osteophytes. No large disc herniation or high-grade spinal stenosis identified. CT LUMBAR SPINE FINDINGS Segmentation: 5 lumbar type vertebral bodies. Alignment: S-shaped lumbar scoliosis, similar to previous MRI. The lateral alignment is within physiologic limits. Vertebrae: Status post laminectomy and PLIF at L5-S1. The hardware is intact without loosening. Solid interbody fusion at L4-5 and L5-S1. No evidence of acute fracture or pars defect. There  is multilevel spondylosis. Paraspinal and other soft tissues: No acute paraspinal findings are seen. There is diffuse aortic and branch vessel atherosclerosis. Nonobstructing bilateral renal calculi. Diverticular changes in the distal colon. Disc levels: L1-2: Chronic degenerative disc disease with loss of disc height, a chronic central disc extrusion with caudal migration and endplate osteophytes. Chronic left greater than right foraminal narrowing. L2-3: Chronic degenerative disc disease with loss of disc height, annular disc bulging and endplate osteophytes. Bilateral facet and ligamentous hypertrophy. Mild spinal  stenosis with mild chronic foraminal narrowing bilaterally. L3-4: Progressive loss of disc height with annular disc bulging and endplate osteophytes. Advanced facet and ligamentous hypertrophy. Progressive spinal stenosis which appears severe. Associated least moderate lateral recess and foraminal narrowing bilaterally. L4-5: Solid interbody fusion without significant spinal stenosis. L5-S1: Status post right laminectomy and PLIF. Stable chronic mild right foraminal narrowing. IMPRESSION: 1. No acute findings identified in the thoracic or lumbar spine. 2. Previous L4-S1 fusion with solid interbody fusion and no recurrent spinal stenosis. 3. Progressive adjacent segment disease at L3-4 with progressive multifactorial spinal stenosis, severe. 4. Additional spondylosis throughout the thoracolumbar spine without significant spinal stenosis or large disc herniation. 5.  Aortic Atherosclerosis (ICD10-I70.0). Electronically Signed   By: Richardean Sale M.D.   On: 08/02/2021 13:50   CT Thoracic Spine Wo Contrast  Result Date: 08/02/2021 CLINICAL DATA:  Back trauma.  History of breast cancer. EXAM: CT THORACIC AND LUMBAR SPINE WITHOUT CONTRAST TECHNIQUE: Multidetector CT imaging of the thoracic and lumbar spine was performed without contrast. Multiplanar CT image reconstructions were also generated. RADIATION DOSE REDUCTION: This exam was performed according to the departmental dose-optimization program which includes automated exposure control, adjustment of the mA and/or kV according to patient size and/or use of iterative reconstruction technique. COMPARISON:  Chest and rib radiographs same date, PET-CT 10/06/2011, lumbar spine radiographs 01/25/2019 and lumbar MRI 11/29/2017. FINDINGS: CT THORACIC SPINE FINDINGS Alignment: Minimal convex left scoliosis. The lateral alignment is normal. Vertebrae: No evidence of acute fracture or traumatic subluxation. Multilevel spondylosis with disc space narrowing and endplate  osteophytes. Paraspinal and other soft tissues: No acute paraspinal findings are evident. Diffuse atherosclerosis of the aorta, great vessels and coronary arteries. Dependent atelectasis in both lungs. Disc levels: Multilevel spondylosis with small disc protrusions and endplate osteophytes. No large disc herniation or high-grade spinal stenosis identified. CT LUMBAR SPINE FINDINGS Segmentation: 5 lumbar type vertebral bodies. Alignment: S-shaped lumbar scoliosis, similar to previous MRI. The lateral alignment is within physiologic limits. Vertebrae: Status post laminectomy and PLIF at L5-S1. The hardware is intact without loosening. Solid interbody fusion at L4-5 and L5-S1. No evidence of acute fracture or pars defect. There is multilevel spondylosis. Paraspinal and other soft tissues: No acute paraspinal findings are seen. There is diffuse aortic and branch vessel atherosclerosis. Nonobstructing bilateral renal calculi. Diverticular changes in the distal colon. Disc levels: L1-2: Chronic degenerative disc disease with loss of disc height, a chronic central disc extrusion with caudal migration and endplate osteophytes. Chronic left greater than right foraminal narrowing. L2-3: Chronic degenerative disc disease with loss of disc height, annular disc bulging and endplate osteophytes. Bilateral facet and ligamentous hypertrophy. Mild spinal stenosis with mild chronic foraminal narrowing bilaterally. L3-4: Progressive loss of disc height with annular disc bulging and endplate osteophytes. Advanced facet and ligamentous hypertrophy. Progressive spinal stenosis which appears severe. Associated least moderate lateral recess and foraminal narrowing bilaterally. L4-5: Solid interbody fusion without significant spinal stenosis. L5-S1: Status post right laminectomy and PLIF. Stable chronic mild right  foraminal narrowing. IMPRESSION: 1. No acute findings identified in the thoracic or lumbar spine. 2. Previous L4-S1 fusion with  solid interbody fusion and no recurrent spinal stenosis. 3. Progressive adjacent segment disease at L3-4 with progressive multifactorial spinal stenosis, severe. 4. Additional spondylosis throughout the thoracolumbar spine without significant spinal stenosis or large disc herniation. 5.  Aortic Atherosclerosis (ICD10-I70.0). Electronically Signed   By: Richardean Sale M.D.   On: 08/02/2021 13:50   CT Shoulder Left Wo Contrast  Result Date: 08/02/2021 CLINICAL DATA:  Shoulder pain.  Stress fracture suspected. EXAM: CT OF THE UPPER LEFT EXTREMITY WITHOUT CONTRAST TECHNIQUE: Multidetector CT imaging of the upper left extremity was performed according to the standard protocol. RADIATION DOSE REDUCTION: This exam was performed according to the departmental dose-optimization program which includes automated exposure control, adjustment of the mA and/or kV according to patient size and/or use of iterative reconstruction technique. COMPARISON:  Left shoulder radiographs 08/02/2021 FINDINGS: Bones/Joint/Cartilage Severe superior glenohumeral joint space narrowing with bone-on-bone contact. Moderate inferior humeral head-neck junction and peripheral glenoid degenerative osteophytes. Mild-to-moderate glenoid subchondral degenerative cystic change. The humeral head is high-riding and contacts the undersurface of the acromion. There is moderate subcortical sclerosis and cystic change within the adjacent acromion consistent with chronic abutment with the humeral head. Minimal distal lateral acromion degenerative spurring. Mild acromioclavicular joint space narrowing and peripheral osteophytosis. Partially visualized severe multilevel degenerative disc changes of the cervical and thoracic spine. Ligaments Suboptimally assessed by CT. Muscles and Tendons Moderate supraspinatus and infraspinatus muscle atrophy. Soft tissues Incidental note of 5 mm low-density nodule within the left thyroid lobe. This is not clinically  significant and no follow-up imaging is recommended. Moderate to high-grade atherosclerotic calcifications within the thoracic aorta. IMPRESSION: 1. Severe glenohumeral osteoarthritis. 2. No acute fracture is seen. 3. High-riding humeral head again abuts the undersurface of the acromion suggesting full-thickness superior rotator cuff tears. There is moderate atrophy of the supraspinatus and infraspinatus muscles consistent with chronic full-thickness tendon tears. Electronically Signed   By: Yvonne Kendall M.D.   On: 08/02/2021 13:45   CT Cervical Spine Wo Contrast  Result Date: 08/02/2021 CLINICAL DATA:  Neck trauma EXAM: CT CERVICAL SPINE WITHOUT CONTRAST TECHNIQUE: Multidetector CT imaging of the cervical spine was performed without intravenous contrast. Multiplanar CT image reconstructions were also generated. RADIATION DOSE REDUCTION: This exam was performed according to the departmental dose-optimization program which includes automated exposure control, adjustment of the mA and/or kV according to patient size and/or use of iterative reconstruction technique. COMPARISON:  09/16/2007 FINDINGS: Alignment: Minimal anterolisthesis C7-T1 unchanged. Remaining alignments normal Skull base and vertebrae: Osseous demineralization. Visualized skull base intact. Vertebral body heights maintained. Multilevel disc space narrowing and endplate spur formation. Multilevel facet degenerative changes. No fracture, additional subluxation, or bone destruction. Soft tissues and spinal canal: Prevertebral soft tissues normal thickness. Atherosclerotic calcification of internal carotid and vertebral arteries at skull base as well as carotid bifurcations and proximal great vessels Disc levels:  Bulging disc at C2-C3. Upper chest: Lung apices clear Other: N/A IMPRESSION: Multilevel degenerative disc and facet disease changes of the cervical spine as above. No acute cervical spine abnormalities. Electronically Signed   By: Lavonia Dana M.D.   On: 08/02/2021 13:35   DG Shoulder Left  Result Date: 08/02/2021 CLINICAL DATA:  Fall with deformity. EXAM: LEFT SHOULDER - 2+ VIEW COMPARISON:  None Available. FINDINGS: Three view study. Bones are diffusely demineralized. No definite evidence for an acute fracture. No findings to suggest shoulder separation or dislocation.  Degenerative changes are seen in the acromioclavicular joint, glenohumeral joint, and at the rotator cuff insertion. Loss of acromial humeral distance suggest chronic rotator cuff pathology. IMPRESSION: 1. No discernible fracture line in the proximal humerus although degenerative changes and osteopenia hinders assessment. CT imaging could be used to further evaluate as clinically warranted. Electronically Signed   By: Misty Stanley M.D.   On: 08/02/2021 12:39   DG Ribs Bilateral W/Chest  Result Date: 08/02/2021 CLINICAL DATA:  Dizziness, pain EXAM: BILATERAL RIBS AND CHEST - 4+ VIEW COMPARISON:  None Available. FINDINGS: No fracture or other bone lesions are seen involving the ribs. There is no evidence of pneumothorax or pleural effusion. Both lungs are clear. Cardiomegaly. IMPRESSION: 1. No displaced fracture or other radiographic abnormality of the ribs. 2.  Cardiomegaly. Electronically Signed   By: Delanna Ahmadi M.D.   On: 08/02/2021 12:34     Subjective: No acute issues or events overnight denies nausea vomiting diarrhea constipation headache fevers chills or chest pain   Discharge Exam: Vitals:   08/03/21 0920 08/03/21 0923  BP: (!) 143/95 (!) 143/95  Pulse: 98   Resp: 15   Temp:    SpO2: 97%    Vitals:   08/03/21 0536 08/03/21 0627 08/03/21 0920 08/03/21 0923  BP: (!) 146/100 (!) 135/106 (!) 143/95 (!) 143/95  Pulse: 89  98   Resp: 16  15   Temp: 97.8 F (36.6 C)     TempSrc: Oral     SpO2: 98%  97%   Weight:      Height:        General: Pt is alert, awake, not in acute distress Cardiovascular: RRR, S1/S2 +, no rubs, no  gallops Respiratory: CTA bilaterally, no wheezing, no rhonchi Abdominal: Soft, NT, ND, bowel sounds + Extremities: Right upper extremity range of motion limited secondary to pain    The results of significant diagnostics from this hospitalization (including imaging, microbiology, ancillary and laboratory) are listed below for reference.     Microbiology: No results found for this or any previous visit (from the past 240 hour(s)).   Labs: BNP (last 3 results) No results for input(s): "BNP" in the last 8760 hours. Basic Metabolic Panel: Recent Labs  Lab 08/02/21 1139 08/02/21 1457 08/03/21 0317  NA 142  --  139  K 2.9*  --  3.0*  CL 100  --  102  CO2 31  --  26  GLUCOSE 115*  --  119*  BUN 11  --  8  CREATININE 0.84  --  0.65  CALCIUM 10.1  --  9.6  MG  --  1.7  --    Liver Function Tests: No results for input(s): "AST", "ALT", "ALKPHOS", "BILITOT", "PROT", "ALBUMIN" in the last 168 hours. No results for input(s): "LIPASE", "AMYLASE" in the last 168 hours. No results for input(s): "AMMONIA" in the last 168 hours. CBC: Recent Labs  Lab 08/02/21 1139 08/03/21 0317  WBC 4.9 6.3  HGB 13.7 13.3  HCT 43.0 39.5  MCV 97.3 91.0  PLT 235 238   Cardiac Enzymes: No results for input(s): "CKTOTAL", "CKMB", "CKMBINDEX", "TROPONINI" in the last 168 hours. BNP: Invalid input(s): "POCBNP" CBG: No results for input(s): "GLUCAP" in the last 168 hours. D-Dimer No results for input(s): "DDIMER" in the last 72 hours. Hgb A1c No results for input(s): "HGBA1C" in the last 72 hours. Lipid Profile No results for input(s): "CHOL", "HDL", "LDLCALC", "TRIG", "CHOLHDL", "LDLDIRECT" in the last 72 hours. Thyroid function studies  Recent Labs    08/03/21 0317  TSH 1.298   Anemia work up No results for input(s): "VITAMINB12", "FOLATE", "FERRITIN", "TIBC", "IRON", "RETICCTPCT" in the last 72 hours. Urinalysis No results found for: "COLORURINE", "APPEARANCEUR", "LABSPEC", "PHURINE",  "GLUCOSEU", "HGBUR", "BILIRUBINUR", "KETONESUR", "PROTEINUR", "UROBILINOGEN", "NITRITE", "LEUKOCYTESUR" Sepsis Labs Recent Labs  Lab 08/02/21 1139 08/03/21 0317  WBC 4.9 6.3   Microbiology No results found for this or any previous visit (from the past 240 hour(s)).   Time coordinating discharge: Over 30 minutes  SIGNED:   Little Ishikawa, DO [facility] 08/03/2021, 6:27 PM Pager   If 7PM-7AM, please contact night-coverage [email or web address]

## 2021-08-03 NOTE — Progress Notes (Signed)
  Echocardiogram 2D Echocardiogram has been performed.  Judy [name] 08/03/2021, 8:30 AM

## 2021-08-03 NOTE — TOC Initial Note (Signed)
Transition of Care [facility]) - Initial/Assessment Note    Patient Details  Name: Judy [name] MRN: [id number] Date of Birth: 02/23/1930  [facility] of [facility]) CM/SW Contact:    Pollie Friar, RN Phone Number: 08/03/2021, 1:30 PM  Clinical Narrative:                 Patient is from home alone but has family that checks in on her. She still drives self as needed.  Pt manages her own medications and denies any issues.  Orders for home health services. Pt has no preference for [facility] agency. CM has arranged home health through [facility]. Information on the AVS. Pt will have transport home when discharged.  Expected Discharge Plan: [place] Barriers to Discharge: Continued Medical Work up   Patient Goals and CMS Choice   CMS Medicare.gov Compare Post Acute Care list provided to:: Patient Choice offered to / list presented to : Patient  Expected Discharge Plan and Services Expected Discharge Plan: [place]   Discharge Planning Services: CM Consult Post Acute Care Choice: [place] arrangements for the past 2 months: Single Family Home                           HH Arranged: PT, OT HH Agency: [place] Date [facility] Agency Contacted: 08/03/21   Representative spoke with at [place]: Tommi Rumps  Prior Living Arrangements/Services Living arrangements for the past 2 months: [place] Lives with:: Self Patient language and need for interpreter reviewed:: Yes Do you feel safe going back to the place where you live?: Yes        Care giver support system in place?: No (comment) Current home services: DME (cane/ shower seat) Criminal Activity/Legal Involvement Pertinent to Current Situation/Hospitalization: No - Comment as needed  Activities of Daily Living Home Assistive Devices/Equipment: Wheelchair, Eyeglasses, Dentures (specify type) ADL Screening (condition at time of admission) Patient's cognitive ability adequate to  safely complete daily activities?: Yes Is the patient deaf or have difficulty hearing?: No Does the patient have difficulty seeing, even when wearing glasses/contacts?: No Does the patient have difficulty concentrating, remembering, or making decisions?: No Patient able to express need for assistance with ADLs?: Yes Does the patient have difficulty dressing or bathing?: No Independently performs ADLs?: Yes (appropriate for developmental age) Feeding: Independent In/Out Bed: Needs assistance Does the patient have difficulty walking or climbing stairs?: No Weakness of Legs: None Weakness of Arms/Hands: None  Permission Sought/Granted                  Emotional Assessment Appearance:: Appears stated age Attitude/Demeanor/Rapport: Engaged Affect (typically observed): Accepting Orientation: : Oriented to Self, Oriented to Place, Oriented to  Time, Oriented to Situation   Psych Involvement: No (comment)  Admission diagnosis:  Dizziness [R42] New onset atrial fibrillation (HCC) [I48.91] Atrial fibrillation, unspecified type ([place]) [I48.91] Traumatic tear of left rotator cuff, unspecified tear extent, initial encounter [S46.012A] Patient Active Problem List   Diagnosis Date Noted   New onset atrial fibrillation ([place]) 08/02/2021   Fall at home, initial encounter 08/02/2021   Rotator cuff tear arthropathy, right 08/02/2021   Chronic back pain 08/02/2021   DNR (do not resuscitate) 08/02/2021   Hypokalemia 09/07/2013   Cancer of breast, intraductal    HTN (hypertension)    Dyslipidemia    Breast cancer, left ([place]) 09/26/2011   DCIS (ductal carcinoma in situ)  of breast 09/12/2011   PCP:  Mayra Neer, MD Pharmacy:   [facility] [address] [place] [address] Phone: [phone] Fax: [place], [place] - [address] [facility] [place] [address] Phone:  [phone] Fax: [phone]     Social Determinants of Health (SDOH) Interventions    Readmission Risk Interventions     No data to display

## 2021-08-03 NOTE — Progress Notes (Signed)
Went over discharge paper work with patient and sister. Answered all questions, PIV removed. All belongings at bedside.

## 2021-08-03 NOTE — Evaluation (Signed)
Occupational Therapy Evaluation Patient Details Name: Judy [name] MRN: [id number] DOB: Feb 11, 1931 Today's Date: 08/03/2021   History of Present Illness Pt adm 6/19 with dizziness and fall. Pt found to have new onset afib. Pt also with left shoulder pain after trying to lift 40# bag of mulch from trunk and CT shows lt rotator cuff tear.  PMH - breast CA, HTN, back pain, L rotator cuff repair, back surgery, osteoporosis.   Clinical Impression   Since her husband recently passed away. Pt lives alone independently and occassionally uses a RW "when I need it".  Pt with significant cognitive impairment as indicated by scoring a 21/28 on the Short Blessed Test of Concentration and Memory (normal score 0-4). Able to mobilize and complete ADL tasks with S. May benefit from pain patch L shoulder and will need follow up therapy for L RTC impairment. Given apparent cognitive impairment, recommend close S for medication/financial management and IADL tasks. Family most likely needs to assess future living arrangements. If family can drive pt to appointments, she would benefit from outpt OT for L shoulder. If family is unable to provide transportation, recommend [facility] services. Acute OT to follow.  BP and VSS     Recommendations for follow up therapy are one component of a multi-disciplinary discharge planning process, led by the attending physician.  Recommendations may be updated based on patient status, additional functional criteria and insurance authorization.   Follow Up Recommendations  Home health OT    Assistance Recommended at Discharge Frequent or constant Supervision/Assistance  Patient can return home with the following A little help with bathing/dressing/bathroom;Assistance with cooking/housework;Direct supervision/assist for medications management;Direct supervision/assist for financial management;Assist for transportation    Functional Status Assessment  Patient has had a recent decline in  their functional status and demonstrates the ability to make significant improvements in function in a reasonable and predictable amount of time.  Equipment Recommendations  None recommended by OT    Recommendations for Other Services       Precautions / Restrictions Precautions Precautions: Fall Required Braces or Orthoses: Sling (LUe for comfort)      Mobility Bed Mobility Overal bed mobility: Modified Independent                  Transfers Overall transfer level: Needs assistance Equipment used: Rolling walker (2 wheels) Transfers: Sit to/from Stand Sit to Stand: Min guard           General transfer comment: initially falling backwards however on 2nd nad 3rd trial able ot stand with S      Balance Overall balance assessment: History of Falls                                         ADL either performed or assessed with clinical judgement   ADL Overall ADL's : Needs assistance/impaired                                     Functional mobility during ADLs: Min guard;Rolling walker (2 wheels);Cueing for safety General ADL Comments: overall S for ADL tasks with exception on minguard for mobility dueto tripping over shoe and unsteady at times     Vision Baseline Vision/History: 1 Wears glasses (reading)       Perception     Praxis  Pertinent Vitals/Pain Pain Assessment Pain Assessment: Faces Faces Pain Scale: Hurts even more Pain Location: L shoulder Pain Descriptors / Indicators: Discomfort, Grimacing, Aching Pain Intervention(s): Limited activity within patient's tolerance     Hand Dominance Right   Extremity/Trunk Assessment Upper Extremity Assessment Upper Extremity Assessment: LUE deficits/detail LUE Deficits / Details: limited ability to use LUE due to pain, however able to use for ADL tasks LUE: Unable to fully assess due to pain;Shoulder pain with ROM;Shoulder pain at rest (RTC injury) LUE  Coordination: decreased gross motor   Lower Extremity Assessment Lower Extremity Assessment: Defer to PT evaluation   Cervical / Trunk Assessment Cervical / Trunk Assessment: Other exceptions (forward head)   Communication Communication Communication: HOH   Cognition Arousal/Alertness: Awake/alert Behavior During Therapy: WFL for tasks assessed/performed Overall Cognitive Status: No family/caregiver present to determine baseline cognitive functioning                                 General Comments: Assessed with the Short Blessesd Test of Concentration and attention and scored a 21/28 which demonstrates significant impairment     General Comments       Exercises     Shoulder Instructions      Home Living Family/patient expects to be discharged to:: Private residence Living Arrangements: Alone Available Help at Discharge: Family;Available PRN/intermittently Type of Home: House Home Access: Stairs to enter [employer] of Steps: 2 Entrance Stairs-Rails: None Home Layout: Two level;Able to live on main level with bedroom/bathroom     Bathroom Shower/Tub: Tub/shower unit;Door   [employer] Toilet: Standard Bathroom Accessibility: Yes (sideways) How Accessible: Accessible via walker Home Equipment: [place] (2 wheels);Shower seat;Grab bars - tub/shower          Prior Functioning/Environment Prior Level of Function : Independent/Modified Independent;History of Falls (last six months)             Mobility Comments: uses a RW "when I need it"; used it when her husband died          OT Problem List: Decreased strength;Decreased range of motion;Decreased activity tolerance;Impaired balance (sitting and/or standing);Decreased coordination;Decreased cognition;Decreased safety awareness;Decreased knowledge of use of DME or AE;Impaired UE functional use;Pain      OT Treatment/Interventions: Self-care/ADL training;Therapeutic exercise;DME  and/or AE instruction;Energy conservation;Therapeutic activities;Cognitive remediation/compensation;Patient/family education;Balance training    OT Goals(Current goals can be found in the care plan section) Acute Rehab OT Goals Patient Stated Goal: to go home OT Goal Formulation: With patient Time For Goal Achievement: 08/17/21 Potential to Achieve Goals: Good  OT Frequency: Min 2X/week    Co-evaluation              AM-PAC OT "6 Clicks" Daily Activity     Outcome Measure Help from another person eating meals?: None Help from another person taking care of personal grooming?: A Little Help from another person toileting, which includes using toliet, bedpan, or urinal?: A Little Help from another person bathing (including washing, rinsing, drying)?: A Little Help from another person to put on and taking off regular upper body clothing?: A Little Help from another person to put on and taking off regular lower body clothing?: A Little 6 Click Score: 19   End of Session Equipment Utilized During Treatment: Gait belt;Rolling walker (2 wheels) Nurse Communication: Mobility status;Other (comment) (DC needs)  Activity Tolerance: Patient tolerated treatment well Patient left: in chair;with call bell/phone within reach;Other (comment) (PT to place chair  alarm pad)  OT Visit Diagnosis: Unsteadiness on feet (R26.81);Muscle weakness (generalized) (M62.81);History of falling (Z91.81);Other symptoms and signs involving cognitive function;Pain Pain - Right/Left: Left Pain - part of body: Shoulder                Time: 6606-3016 OT Time Calculation (min): 23 min Charges:  OT General Charges $OT Visit: 1 Visit OT Evaluation $OT Eval Low Complexity: [place], OT/L   Acute OT Clinical Specialist [place] Pager [phone] Office [phone]   [facility] 08/03/2021, 10:07 AM

## 2021-08-03 NOTE — Evaluation (Signed)
Physical Therapy Evaluation Patient Details Name: Judy [name] MRN: [id number] DOB: [birth year] Today's Date: 08/03/2021  History of Present Illness  Pt adm 6/19 with dizziness and fall. Pt found to have new onset afib. Pt also with left shoulder pain after trying to lift 40# bag of mulch from trunk and CT shows lt rotator cuff tear.  PMH - breast CA, HTN, back pain, lt rotator cuff repair, back surgery, osteoporosis.  Clinical Impression  Pt presents to PT with some balance, mobility, and cognitive deficits. Suspect these have been present at home prior to hospitalization. Suggested to pt that she wear shoes other than the "croc" like ones to help prevent stumbles. Also suggest family begin to consider long term plan for pt as her cognition is significantly impaired.        Recommendations for follow up therapy are one component of a multi-disciplinary discharge planning process, led by the attending physician.  Recommendations may be updated based on patient status, additional functional criteria and insurance authorization.  Follow Up Recommendations Home health PT    Assistance Recommended at Discharge Frequent or constant Supervision/Assistance (for cognitive deficits)  Patient can return home with the following  Assist for transportation;Direct supervision/assist for financial management;Direct supervision/assist for medications management;Help with stairs or ramp for entrance    Equipment Recommendations None recommended by PT  Recommendations for Other Services       Functional Status Assessment Patient has had a recent decline in their functional status and demonstrates the ability to make significant improvements in function in a reasonable and predictable amount of time.     Precautions / Restrictions Precautions Precautions: Fall Required Braces or Orthoses: Sling (LUe for comfort)      Mobility  Bed Mobility Overal bed mobility: Modified Independent                   Transfers Overall transfer level: Needs assistance Equipment used: Rolling walker (2 wheels), None Transfers: Sit to/from Stand Sit to Stand: Supervision           General transfer comment: supervision for safety    Ambulation/Gait Ambulation/Gait assistance: Min guard, Supervision Gait Distance (Feet): 250 Feet Assistive device: Rolling walker (2 wheels), None Gait Pattern/deviations: Step-through pattern, Decreased stride length Gait velocity: decr Gait velocity interpretation: 1.31 - 2.62 ft/sec, indicative of limited community ambulator   General Gait Details: Assist for safety with and without walker. Pt in "croc" type shoes which she caught on floor 2 times causing a minor stumble which she was able to correct without assistance  Stairs Stairs: Yes Stairs assistance: Min assist Stair Management: One rail Right, Backwards, Step to pattern Number of Stairs: 1 General stair comments: Used portable step and bed rail to simulate  Wheelchair Mobility    Modified Rankin (Stroke Patients Only)       Balance Overall balance assessment: History of Falls, Needs assistance Sitting-balance support: No upper extremity supported, Feet supported Sitting balance-Leahy Scale: Good     Standing balance support: No upper extremity supported, During functional activity Standing balance-Leahy Scale: Good               High level balance activites: Backward walking, Turns, Direction changes High Level Balance Comments: required min guard for high level activities. Picked up object from floor with min guard             Pertinent Vitals/Pain Pain Assessment Pain Assessment: 0-10 Pain Score: 9  Pain Location: L shoulder Pain Descriptors / Indicators: Aching,  Throbbing Pain Intervention(s): Limited activity within patient's tolerance, Repositioned    Home Living Family/patient expects to be discharged to:: Private residence Living Arrangements: Alone Available  Help at Discharge: Family;Available PRN/intermittently Type of Home: House Home Access: Stairs to enter Entrance Stairs-Rails: Right Entrance Stairs-Number of Steps: 2   Home Layout: Two level;Able to live on main level with bedroom/bathroom Home Equipment: Rolling Walker (2 wheels);Shower seat;Grab bars - tub/shower      Prior Function Prior Level of Function : Independent/Modified Independent;History of Falls (last six months)             Mobility Comments: uses a RW "when I need it"; used it when her husband died       Hand Dominance   Dominant Hand: Right    Extremity/Trunk Assessment   Upper Extremity Assessment Upper Extremity Assessment: Defer to OT evaluation LUE Deficits / Details: limited ability to use LUE due to pain, however able to use for ADL tasks LUE: Unable to fully assess due to pain;Shoulder pain with ROM;Shoulder pain at rest (RTC injury) LUE Coordination: decreased gross motor    Lower Extremity Assessment Lower Extremity Assessment: Generalized weakness    Cervical / Trunk Assessment Cervical / Trunk Assessment: Other exceptions (forward head)  Communication   Communication: HOH  Cognition Arousal/Alertness: Awake/alert Behavior During Therapy: WFL for tasks assessed/performed Overall Cognitive Status: No family/caregiver present to determine baseline cognitive functioning                                 General Comments: See OT note for test results        General Comments      Exercises     Assessment/Plan    PT Assessment Patient needs continued PT services  PT Problem List Decreased balance;Decreased mobility;Decreased cognition;Decreased safety awareness       PT Treatment Interventions DME instruction;Gait training;Functional mobility training;Therapeutic activities;Therapeutic exercise;Balance training;Patient/family education    PT Goals (Current goals can be found in the Care Plan section)  Acute Rehab PT  Goals Patient Stated Goal: go home PT Goal Formulation: With patient Time For Goal Achievement: 08/17/21 Potential to Achieve Goals: Good    Frequency Min 3X/week     Co-evaluation               AM-PAC PT "6 Clicks" Mobility  Outcome Measure Help needed turning from your back to your side while in a flat bed without using bedrails?: None Help needed moving from lying on your back to sitting on the side of a flat bed without using bedrails?: None Help needed moving to and from a bed to a chair (including a wheelchair)?: A Little Help needed standing up from a chair using your arms (e.g., wheelchair or bedside chair)?: A Little Help needed to walk in hospital room?: A Little Help needed climbing 3-5 steps with a railing? : A Little 6 Click Score: 20    End of Session Equipment Utilized During Treatment: Gait belt Activity Tolerance: Patient tolerated treatment well Patient left: in chair;with call bell/phone within reach;with chair alarm set   PT Visit Diagnosis: Unsteadiness on feet (R26.81)    Time: 4098-1191 PT Time Calculation (min) (ACUTE ONLY): 17 min   Charges:   PT Evaluation $PT Eval Low Complexity: [place] Office [place] 08/03/2021, 11:00 AM

## 2021-08-03 NOTE — Care Management Obs Status (Signed)
[place] NOTIFICATION   Patient Details  Name: Judy [name] MRN: [id number] Date of Birth: 12-Mar-1930   Medicare Observation Status Notification Given:  Yes    Pollie Friar, RN 08/03/2021, 12:53 PM

## 2021-08-05 DIAGNOSIS — E876 Hypokalemia: Secondary | ICD-10-CM | POA: Diagnosis not present

## 2021-08-05 DIAGNOSIS — R296 Repeated falls: Secondary | ICD-10-CM | POA: Diagnosis not present

## 2021-08-05 DIAGNOSIS — I4891 Unspecified atrial fibrillation: Secondary | ICD-10-CM | POA: Diagnosis not present

## 2021-08-05 DIAGNOSIS — S46012D Strain of muscle(s) and tendon(s) of the rotator cuff of left shoulder, subsequent encounter: Secondary | ICD-10-CM | POA: Diagnosis not present

## 2021-08-07 DIAGNOSIS — M48061 Spinal stenosis, lumbar region without neurogenic claudication: Secondary | ICD-10-CM | POA: Diagnosis not present

## 2021-08-07 DIAGNOSIS — I4891 Unspecified atrial fibrillation: Secondary | ICD-10-CM | POA: Diagnosis not present

## 2021-08-07 DIAGNOSIS — I700 Atherosclerosis of aorta: Secondary | ICD-10-CM | POA: Diagnosis not present

## 2021-08-07 DIAGNOSIS — G8929 Other chronic pain: Secondary | ICD-10-CM | POA: Diagnosis not present

## 2021-08-07 DIAGNOSIS — M75101 Unspecified rotator cuff tear or rupture of right shoulder, not specified as traumatic: Secondary | ICD-10-CM | POA: Diagnosis not present

## 2021-08-07 DIAGNOSIS — I119 Hypertensive heart disease without heart failure: Secondary | ICD-10-CM | POA: Diagnosis not present

## 2021-08-07 DIAGNOSIS — M4802 Spinal stenosis, cervical region: Secondary | ICD-10-CM | POA: Diagnosis not present

## 2021-08-07 DIAGNOSIS — M2578 Osteophyte, vertebrae: Secondary | ICD-10-CM | POA: Diagnosis not present

## 2021-08-07 DIAGNOSIS — M47816 Spondylosis without myelopathy or radiculopathy, lumbar region: Secondary | ICD-10-CM | POA: Diagnosis not present

## 2021-08-07 DIAGNOSIS — M419 Scoliosis, unspecified: Secondary | ICD-10-CM | POA: Diagnosis not present

## 2021-08-07 DIAGNOSIS — M19012 Primary osteoarthritis, left shoulder: Secondary | ICD-10-CM | POA: Diagnosis not present

## 2021-08-07 DIAGNOSIS — M25712 Osteophyte, left shoulder: Secondary | ICD-10-CM | POA: Diagnosis not present

## 2021-08-10 DIAGNOSIS — M47816 Spondylosis without myelopathy or radiculopathy, lumbar region: Secondary | ICD-10-CM | POA: Diagnosis not present

## 2021-08-10 DIAGNOSIS — I4891 Unspecified atrial fibrillation: Secondary | ICD-10-CM | POA: Diagnosis not present

## 2021-08-10 DIAGNOSIS — I119 Hypertensive heart disease without heart failure: Secondary | ICD-10-CM | POA: Diagnosis not present

## 2021-08-10 DIAGNOSIS — M419 Scoliosis, unspecified: Secondary | ICD-10-CM | POA: Diagnosis not present

## 2021-08-10 DIAGNOSIS — M75101 Unspecified rotator cuff tear or rupture of right shoulder, not specified as traumatic: Secondary | ICD-10-CM | POA: Diagnosis not present

## 2021-08-11 ENCOUNTER — Other Ambulatory Visit: Payer: Self-pay

## 2021-08-11 ENCOUNTER — Emergency Department (HOSPITAL_COMMUNITY): Payer: Medicare Other

## 2021-08-11 ENCOUNTER — Encounter (HOSPITAL_COMMUNITY): Payer: Self-pay | Admitting: Pharmacy Technician

## 2021-08-11 ENCOUNTER — Emergency Department (HOSPITAL_COMMUNITY)
Admission: EM | Admit: 2021-08-11 | Discharge: 2021-08-13 | Disposition: A | Discharge status: Home / Self Care | Payer: Medicare Other | Attending: Emergency Medicine | Admitting: Emergency Medicine

## 2021-08-11 ENCOUNTER — Ambulatory Visit: Payer: Medicare Other

## 2021-08-11 DIAGNOSIS — R4182 Altered mental status, unspecified: Secondary | ICD-10-CM | POA: Diagnosis not present

## 2021-08-11 DIAGNOSIS — R296 Repeated falls: Secondary | ICD-10-CM | POA: Insufficient documentation

## 2021-08-11 DIAGNOSIS — J40 Bronchitis, not specified as acute or chronic: Secondary | ICD-10-CM | POA: Diagnosis not present

## 2021-08-11 DIAGNOSIS — M47814 Spondylosis without myelopathy or radiculopathy, thoracic region: Secondary | ICD-10-CM | POA: Diagnosis not present

## 2021-08-11 DIAGNOSIS — F0390 Unspecified dementia without behavioral disturbance: Secondary | ICD-10-CM | POA: Insufficient documentation

## 2021-08-11 DIAGNOSIS — M542 Cervicalgia: Secondary | ICD-10-CM | POA: Diagnosis not present

## 2021-08-11 DIAGNOSIS — M47816 Spondylosis without myelopathy or radiculopathy, lumbar region: Secondary | ICD-10-CM | POA: Diagnosis not present

## 2021-08-11 DIAGNOSIS — E860 Dehydration: Secondary | ICD-10-CM | POA: Diagnosis not present

## 2021-08-11 DIAGNOSIS — M25511 Pain in right shoulder: Secondary | ICD-10-CM | POA: Diagnosis not present

## 2021-08-11 DIAGNOSIS — Z043 Encounter for examination and observation following other accident: Secondary | ICD-10-CM | POA: Diagnosis not present

## 2021-08-11 DIAGNOSIS — M47812 Spondylosis without myelopathy or radiculopathy, cervical region: Secondary | ICD-10-CM | POA: Diagnosis not present

## 2021-08-11 DIAGNOSIS — W19XXXA Unspecified fall, initial encounter: Secondary | ICD-10-CM | POA: Diagnosis not present

## 2021-08-11 DIAGNOSIS — I499 Cardiac arrhythmia, unspecified: Secondary | ICD-10-CM | POA: Diagnosis not present

## 2021-08-11 DIAGNOSIS — Z743 Need for continuous supervision: Secondary | ICD-10-CM | POA: Diagnosis not present

## 2021-08-11 DIAGNOSIS — M791 Myalgia, unspecified site: Secondary | ICD-10-CM | POA: Diagnosis present

## 2021-08-11 DIAGNOSIS — I517 Cardiomegaly: Secondary | ICD-10-CM | POA: Diagnosis not present

## 2021-08-11 DIAGNOSIS — S0990XA Unspecified injury of head, initial encounter: Secondary | ICD-10-CM | POA: Diagnosis not present

## 2021-08-11 DIAGNOSIS — I4891 Unspecified atrial fibrillation: Secondary | ICD-10-CM | POA: Diagnosis not present

## 2021-08-11 DIAGNOSIS — R0781 Pleurodynia: Secondary | ICD-10-CM | POA: Diagnosis not present

## 2021-08-11 DIAGNOSIS — M25519 Pain in unspecified shoulder: Secondary | ICD-10-CM | POA: Diagnosis not present

## 2021-08-11 LAB — CBC
HCT: 45.7 % (ref 36.0–46.0)
Hemoglobin: 15.2 g/dL — ABNORMAL HIGH (ref 12.0–15.0)
MCH: 31.5 pg (ref 26.0–34.0)
MCHC: 33.3 g/dL (ref 30.0–36.0)
MCV: 94.8 fL (ref 80.0–100.0)
Platelets: 180 10*3/uL (ref 150–400)
RBC: 4.82 MIL/uL (ref 3.87–5.11)
RDW: 13.3 % (ref 11.5–15.5)
WBC: 9.6 10*3/uL (ref 4.0–10.5)
nRBC: 0 % (ref 0.0–0.2)

## 2021-08-11 LAB — BASIC METABOLIC PANEL
Anion gap: 14 (ref 5–15)
BUN: 17 mg/dL (ref 8–23)
CO2: 23 mmol/L (ref 22–32)
Calcium: 9.8 mg/dL (ref 8.9–10.3)
Chloride: 101 mmol/L (ref 98–111)
Creatinine, Ser: 0.76 mg/dL (ref 0.44–1.00)
GFR, Estimated: >60 mL/min (ref >60)
Glucose, Bld: 126 mg/dL — ABNORMAL HIGH (ref 70–99)
Potassium: 3.3 mmol/L — ABNORMAL LOW (ref 3.5–5.1)
Sodium: 138 mmol/L (ref 135–145)

## 2021-08-11 LAB — URINALYSIS, ROUTINE W REFLEX MICROSCOPIC
Bacteria, UA: NONE SEEN
Bilirubin Urine: NEGATIVE
Glucose, UA: NEGATIVE mg/dL
Hgb urine dipstick: NEGATIVE
Ketones, ur: 5 mg/dL — AB
Nitrite: NEGATIVE
Protein, ur: NEGATIVE mg/dL
Specific Gravity, Urine: 1.011 (ref 1.005–1.030)
pH: 7 (ref 5.0–8.0)

## 2021-08-11 LAB — CK: Total CK: 58 U/L (ref 38–234)

## 2021-08-11 MED ORDER — LISINOPRIL 20 MG PO TABS
40.0000 mg | ORAL_TABLET | Freq: Every day | ORAL | Status: DC
  Administered 2021-08-11 – 2021-08-12 (×2): 40 mg via ORAL
  Filled 2021-08-11 (×4): qty 2

## 2021-08-11 MED ORDER — SODIUM CHLORIDE 0.9 % IV SOLN
1.0000 g | Freq: Once | INTRAVENOUS | Status: AC
  Administered 2021-08-12: 1 g via INTRAVENOUS
  Filled 2021-08-11 (×2): qty 10

## 2021-08-11 MED ORDER — LACTATED RINGERS IV BOLUS
500.0000 mL | Freq: Once | INTRAVENOUS | Status: AC
  Administered 2021-08-11: 500 mL via INTRAVENOUS

## 2021-08-11 MED ORDER — ZIPRASIDONE MESYLATE 20 MG IM SOLR
10.0000 mg | Freq: Once | INTRAMUSCULAR | Status: AC
  Administered 2021-08-11: 10 mg via INTRAMUSCULAR
  Filled 2021-08-11: qty 20

## 2021-08-11 MED ORDER — DILTIAZEM HCL ER COATED BEADS 120 MG PO CP24
120.0000 mg | ORAL_CAPSULE | Freq: Every day | ORAL | Status: DC
  Administered 2021-08-11 – 2021-08-13 (×3): 120 mg via ORAL
  Filled 2021-08-11 (×4): qty 1

## 2021-08-11 MED ORDER — POTASSIUM CHLORIDE CRYS ER 20 MEQ PO TBCR
10.0000 meq | EXTENDED_RELEASE_TABLET | Freq: Every day | ORAL | Status: DC
  Administered 2021-08-11: 10 meq via ORAL
  Filled 2021-08-11 (×3): qty 1

## 2021-08-11 MED ORDER — MELATONIN 3 MG PO TABS: 3.0000 mg | ORAL_TABLET | Freq: Every evening | ORAL | Status: DC | PRN

## 2021-08-11 MED ORDER — METOPROLOL TARTRATE 5 MG/5ML IV SOLN
5.0000 mg | Freq: Once | INTRAVENOUS | Status: AC
  Administered 2021-08-11: 5 mg via INTRAVENOUS
  Filled 2021-08-11: qty 5

## 2021-08-11 MED ORDER — SODIUM CHLORIDE 0.9 % IV BOLUS
500.0000 mL | Freq: Once | INTRAVENOUS | Status: AC
Start: 2021-08-11 — End: 2021-08-11
  Administered 2021-08-11: 500 mL via INTRAVENOUS

## 2021-08-11 MED ORDER — METOPROLOL SUCCINATE ER 25 MG PO TB24
100.0000 mg | ORAL_TABLET | Freq: Every day | ORAL | Status: DC
  Administered 2021-08-11 – 2021-08-13 (×3): 100 mg via ORAL
  Filled 2021-08-11 (×4): qty 4

## 2021-08-11 MED ORDER — ADULT MULTIVITAMIN W/MINERALS CH
1.0000 | ORAL_TABLET | Freq: Every day | ORAL | Status: DC
  Administered 2021-08-11: 1 via ORAL
  Filled 2021-08-11 (×3): qty 1

## 2021-08-11 MED ORDER — HYDROCODONE-ACETAMINOPHEN 5-325 MG PO TABS
1.0000 | ORAL_TABLET | Freq: Once | ORAL | Status: AC
  Administered 2021-08-11: 1 via ORAL
  Filled 2021-08-11: qty 1

## 2021-08-11 MED ORDER — ATORVASTATIN CALCIUM 10 MG PO TABS
10.0000 mg | ORAL_TABLET | Freq: Every day | ORAL | Status: DC
  Administered 2021-08-11 – 2021-08-12 (×2): 10 mg via ORAL
  Filled 2021-08-11 (×2): qty 1

## 2021-08-11 NOTE — ED Notes (Signed)
Safety Sitter at bedside 

## 2021-08-11 NOTE — TOC Initial Note (Signed)
Transition of Care [facility]) - Initial/Assessment Note    Patient Details  Name: Judy [name] MRN: [id number] Date of Birth: 02-04-1931  [facility]) CM/SW Contact:    Raina Mina, [place] Phone Number: 08/11/2021, 3:42 PM  Clinical Narrative:  CSW spoke with family and patient has now agreed to go to SNF. Family has chosen [facility]. CSW spoke with [facility] in admissions and is aware. Insurance authorization is pending.                  Expected Discharge Plan: Skilled Nursing Facility Barriers to Discharge: Continued Medical Work up   Patient Goals and CMS Choice        Expected Discharge Plan and Services Expected Discharge Plan: [place] arrangements for the past 2 months: Single Family Home                           HH Arranged: PT, OT [facility] Agency: [place] Date [place]: 08/03/21      Prior Living Arrangements/Services Living arrangements for the past 2 months: Single Family Home Lives with:: Self Patient language and need for interpreter reviewed:: Yes Do you feel safe going back to the place where you live?: Yes      Need for Family Participation in Patient Care: Yes (Comment) Care giver support system in place?: Yes (comment)   Criminal Activity/Legal Involvement Pertinent to Current Situation/Hospitalization: No - Comment as needed  Activities of Daily Living      Permission Sought/Granted Permission sought to share information with : Family Supports Permission granted to share information with : Yes, Verbal Permission Granted  Share Information with NAME: Judy [name]     Permission granted to share info w Relationship: Sister  Permission granted to share info w Contact Information: [phone]  Emotional Assessment Appearance:: Appears stated age Attitude/Demeanor/Rapport: Engaged Affect (typically observed): Accepting Orientation: : Oriented to Self, Oriented to  Place Alcohol / Substance Use: Not Applicable Psych Involvement: No (comment)  Admission diagnosis:  fall Patient Active Problem List   Diagnosis Date Noted   New onset atrial fibrillation ([place]) 08/02/2021   Fall at home, initial encounter 08/02/2021   Rotator cuff tear arthropathy, right 08/02/2021   Chronic back pain 08/02/2021   DNR (do not resuscitate) 08/02/2021   Hypokalemia 09/07/2013   Cancer of breast, intraductal    HTN (hypertension)    Dyslipidemia    Breast cancer, left ([place]) 09/26/2011   DCIS (ductal carcinoma in situ) of breast 09/12/2011   PCP:  Mayra Neer, MD Pharmacy:   [facility] [address], [place] [address] Phone: [phone] Fax: [place], [place] - [address] [facility] [address] [place] [address] Phone: [phone] Fax: [phone]     Social Determinants of Health (SDOH) Interventions    Readmission Risk Interventions     No data to display

## 2021-08-11 NOTE — ED Provider Notes (Signed)
[facility] EMERGENCY DEPARTMENT Provider Note   CSN: [id number] Arrival date & time: 08/11/21  0710     History  Chief Complaint  Patient presents with   Judy [name] is a 88 y.o. female.  HPI Patient presents with her sister provides a history.  Patient seems to have dementia, level 5 caveat. According to sister the patient has had frequent falls, including 1 in the hours prior to ED arrival today.  Fall was minimal, from approximately bed height, but sister was unable to help the patient get up, and EMS was required for assistance. Seemingly the patient previously complained of thoracic pain, shoulder pain, though she has had pain in these areas before.  Currently patient denies pain in any specific location, acknowledges generalized discomfort. Sister denies other notable recent events.    Home Medications Prior to Admission medications   Medication Sig Start Date End Date Taking? Authorizing Provider  atorvastatin (LIPITOR) 10 MG tablet Take 1 tablet (10 mg total) by mouth daily at 6 PM. 08/03/21   Little Ishikawa, MD  diltiazem (CARDIZEM CD) 120 MG 24 hr capsule Take 1 capsule (120 mg total) by mouth daily. 08/03/21 08/03/22  Little Ishikawa, MD  lisinopril (PRINIVIL,ZESTRIL) 40 MG tablet Take 40 mg by mouth daily.    [provider]  metoprolol succinate (TOPROL-XL) 100 MG 24 hr tablet Take 100 mg by mouth daily. Take with or immediately following a meal.    [provider]  Multiple Vitamin (MULTIVITAMIN) capsule Take 1 capsule by mouth daily.    [provider]  Polyethyl Glycol-Propyl Glycol (SYSTANE OP) Apply 1 drop to eye every morning.    [provider]  potassium chloride (K-DUR,KLOR-CON) 10 MEQ tablet Take 10 mEq by mouth daily.    [provider]      Allergies    Amoxicillin, Codeine, Amlodipine, Aricept [donepezil hcl], Clonidine derivatives, Sertraline, and Clavulanic acid     Review of Systems   Review of Systems  Unable to perform ROS: Dementia    Physical Exam Updated Vital Signs BP (!) 182/103 (BP Location: Left Arm)   Pulse (!) 118   Temp 98.3 F (36.8 C) (Oral)   Resp 16   SpO2 96%  Physical Exam Vitals and nursing note reviewed.  Constitutional:      General: She is not in acute distress.    Appearance: She is well-developed. She is not ill-appearing, toxic-appearing or diaphoretic.     Comments: Frail-appearing elderly female in no distress  HENT:     Head: Normocephalic and atraumatic.  Eyes:     Conjunctiva/sclera: Conjunctivae normal.  Neck:     Comments: Patient describes pain in the left paraspinal lower neck, no midline tenderness or crepitus. Cardiovascular:     Rate and Rhythm: Normal rate and regular rhythm.  Pulmonary:     Effort: Pulmonary effort is normal. No respiratory distress.     Breath sounds: Normal breath sounds. No stridor.  Abdominal:     General: There is no distension.  Musculoskeletal:     Cervical back: Full passive range of motion without pain, normal range of motion and neck supple. No signs of trauma, rigidity or crepitus. Muscular tenderness present. No spinous process tenderness.     Comments: No deformities of the extremities.  Patient flexes and extends both shoulders, both hips to command, without discomfort.  Elbows, wrists unremarkable.  Patient has no pelvis pain with palpation, pelvis is stable.  Skin:    General: Skin is warm and dry.  Neurological:     Mental Status: She is alert.     Cranial Nerves: No cranial nerve deficit.     Motor: Atrophy present. No tremor or abnormal muscle tone.     Coordination: Coordination normal.  Psychiatric:        Behavior: Behavior is slowed and withdrawn.        Cognition and Memory: Cognition is impaired. Memory is impaired.     ED Results / Procedures / Treatments   Labs (all labs ordered are listed, but only abnormal results are displayed) Labs  Reviewed  CBC - Abnormal; Notable for the following components:      Result Value   Hemoglobin 15.2 (*)    All other components within normal limits  BASIC METABOLIC PANEL - Abnormal; Notable for the following components:   Potassium 3.3 (*)    Glucose, Bld 126 (*)    All other components within normal limits  URINALYSIS, ROUTINE W REFLEX MICROSCOPIC - Abnormal; Notable for the following components:   Ketones, ur 5 (*)    Leukocytes,Ua SMALL (*)    All other components within normal limits  CK    EKG None  Radiology DG Ribs Unilateral W/Chest Right  Result Date: 08/11/2021 CLINICAL DATA:  pain post fall EXAM: RIGHT RIBS AND CHEST - 3+ VIEW COMPARISON:  Radiographs 08/02/2021 FINDINGS: There is no evidence of displaced rib fracture. Degenerative changes of the spine with levoconvex thoracic and dextroconvex lumbar curvatures. Partially visualized lower lumbar fusion hardware. Unchanged mildly enlarged cardiac silhouette. No airspace disease. No pleural effusion. No pneumothorax. IMPRESSION: No evidence of displaced rib fracture. No acute cardiopulmonary disease. Unchanged mild cardiomegaly. Electronically Signed   By: Maurine Simmering M.D.   On: 08/11/2021 08:14   DG Shoulder Right  Result Date: 08/11/2021 CLINICAL DATA:  pain post fall EXAM: RIGHT SHOULDER - 2+ VIEW COMPARISON:  None Available. FINDINGS: There is no evidence of acute fracture. Alignment is normal. There is moderate glenohumeral and acromioclavicular joint osteoarthritis. High-riding humeral head with subacromial spurring and greater tuberosity irregularity. IMPRESSION: No acute fracture or dislocation. Moderate glenohumeral and acromioclavicular joint osteoarthritis. Findings of chronic distal rotator cuff disease. Electronically Signed   By: Maurine Simmering M.D.   On: 08/11/2021 08:12    Procedures Procedures    Medications Ordered in ED Medications  sodium chloride 0.9 % bolus 500 mL (has no administration in time range)     ED Course/ Medical Decision Making/ A&P Clinical Course as of 08/11/21 1018  Wed Aug 11, 2021  0912 Potassium(!): 3.3 [JB]    Clinical Course User Index [JB] Bonnielee Haff, Student-PA                           This patient with a Hx of dementia, frailty presents to the ED for concern of pain, weakness following fall, this involves an extensive number of treatment options, and is a complaint that carries with it a high risk of complications and morbidity.    The differential diagnosis includes electrolyte abnormality, infection, dehydration as well as posttraumatic consequences including fracture, intracranial hemorrhage   Social Determinants of Health:  Age, frailty, dementia  Additional history obtained:  Additional history and/or information obtained from sister, notable for details included in HPI   After the initial evaluation, orders, including: Labs fluids x-ray were initiated.   Patient placed on Cardiac and Pulse-Oximetry Monitors. The patient  was maintained on a cardiac monitor.  The cardiac monitored showed an rhythm of 105 sinus tach abnormal The patient was also maintained on pulse oximetry. The readings were typically 99% room air normal   On repeat evaluation of the patient stayed the same 10:18 AM Patient committed by her son in addition to her sister.  They note that the patient has had recent conversation with her physician who is recommended SNF placement, social work has been consulted.  She has been receiving home PT, OT, and the last assessment 2 days ago from physical therapy resulted recommendation for rehab placement as well.  Today's findings were reviewed with family, consistent with mild dehydration, with ketone urea, hemoconcentration, otherwise findings reassuring, Lab Tests:  I personally interpreted labs.  The pertinent results include: Hemoconcentration, ketone urea both consistent with dehydration  Imaging Studies ordered:  I  independently visualized and interpreted imaging which showed no fracture on x-rays I agree with the radiologist interpretation  Consultations Obtained:  I requested consultation with the social work, PT,  and discussed lab and imaging findings as well as pertinent plan.  Dispostion / Final MDM:  After consideration of the diagnostic results and the patient's response to treatment, elderly female, frail, presents in the context of recent increased frequency of falls from home.  Patient's cognitive impairment is noticeable, there are some suspicion for this contributing to her progression.  Here no evidence for traumatic injuries, there is lab evidence for dehydration, but no evidence for bacteremia, sepsis, substantial electrolyte abnormalities.  Patient received fluid resuscitation, and consultation with social work given the patient's safety concerns.  Is agreeable to going to skilled nursing, anticipated discharge within 24 hours.   Final Clinical Impression(s) / ED Diagnoses Final diagnoses:  Fall, initial encounter  Dehydration     Carmin Muskrat, MD 08/11/21 1453

## 2021-08-11 NOTE — ED Provider Notes (Signed)
I was called to bedside by nursing regarding this patient who is currently a boarder awaiting nursing home placement. Patient was found to be wandering around the hallway, unable to be verbally redirected.  On my evaluation, patient appears delirious and does not understand reasoning behind needing to turn to her room.  I reviewed the patient's prior EKGs which is notable for a prolonged QTc interval at 511.  Given this, we will have to be cautious with antipsychotics. Will trial 1 time dose of IM geodon given concern for patient safety.
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
## 2021-08-11 NOTE — NC FL2 (Signed)
[place] LEVEL OF CARE SCREENING TOOL     IDENTIFICATION  Patient Name: Judy [name] Birthdate: Jan 10, 1931 Sex: female Admission Date (Current Location): 08/11/2021  [facility] and [place] Number:  Herbalist and Address:  [facility], [place] [address], [place], [place] [address]      Provider Number: [id number]  Attending Physician Name and Address:  Carmin Muskrat, MD  Relative Name and Phone Number:  Rocky Link [phone], Elaina Pattee, [place], [phone]    Current Level of Care: Hospital Recommended Level of Care: [place] Prior Approval Number:    Date Approved/Denied:   PASRR Number: [id number]  Discharge Plan: SNF    Current Diagnoses: Patient Active Problem List   Diagnosis Date Noted   New onset atrial fibrillation ([place]) 08/02/2021   Fall at home, initial encounter 08/02/2021   Rotator cuff tear arthropathy, right 08/02/2021   Chronic back pain 08/02/2021   DNR (do not resuscitate) 08/02/2021   Hypokalemia 09/07/2013   Cancer of breast, intraductal    HTN (hypertension)    Dyslipidemia    Breast cancer, left ([place]) 09/26/2011   DCIS (ductal carcinoma in situ) of breast 09/12/2011    Orientation RESPIRATION BLADDER Height & Weight     Self, Time, Place  Normal Continent Weight: 145 lbs  Height:   5'2  BEHAVIORAL SYMPTOMS/MOOD NEUROLOGICAL BOWEL NUTRITION STATUS      Continent Diet (Regular)  AMBULATORY STATUS COMMUNICATION OF NEEDS Skin   Extensive Assist Verbally Normal                       Personal Care Assistance Level of Assistance  Bathing, Feeding, Dressing Bathing Assistance: Limited assistance Feeding assistance: Independent Dressing Assistance: Limited assistance     Functional Limitations Info  Sight, Hearing, Speech Sight Info: Impaired (Wears glasses) Hearing Info: Adequate Speech Info: Adequate    SPECIAL CARE FACTORS FREQUENCY                        Contractures Contractures Info: Not present    Additional Factors Info  Code Status, Allergies Code Status Info: Code status not updated Allergies Info: Amoxicillin, Codeine, Amlodipine, Aricept (donepezil Hcl), Clonidine Derivatives, Sertraline, Clavulanic Acid           Current Medications (08/11/2021):  This is the current hospital active medication list Current Facility-Administered Medications  Medication Dose Route Frequency Provider Last Rate Last Admin   atorvastatin (LIPITOR) tablet 10 mg  10 mg Oral q1800 Carmin Muskrat, MD       diltiazem (CARDIZEM CD) 24 hr capsule 120 mg  120 mg Oral Daily Carmin Muskrat, MD       lisinopril (ZESTRIL) tablet 40 mg  40 mg Oral Daily Carmin Muskrat, MD   40 mg at 08/11/21 1115   metoprolol succinate (TOPROL-XL) 24 hr tablet 100 mg  100 mg Oral Daily Carmin Muskrat, MD   100 mg at 08/11/21 1117   multivitamin capsule 1 capsule  1 capsule Oral Daily Carmin Muskrat, MD       potassium chloride (KLOR-CON M) CR tablet 10 mEq  10 mEq Oral Daily Carmin Muskrat, MD   10 mEq at 08/11/21 1114   Current Outpatient Medications  Medication Sig Dispense Refill   atorvastatin (LIPITOR) 10 MG tablet Take 1 tablet (10 mg total) by mouth daily at 6 PM. 30 tablet 1   diltiazem (CARDIZEM CD) 120 MG 24 hr  capsule Take 1 capsule (120 mg total) by mouth daily. 30 capsule 1   lisinopril (PRINIVIL,ZESTRIL) 40 MG tablet Take 40 mg by mouth daily.     metoprolol succinate (TOPROL-XL) 100 MG 24 hr tablet Take 100 mg by mouth daily. Take with or immediately following a meal.     Multiple Vitamin (MULTIVITAMIN) capsule Take 1 capsule by mouth daily.     Polyethyl Glycol-Propyl Glycol (SYSTANE OP) Apply 1 drop to eye every morning.     potassium chloride (K-DUR,KLOR-CON) 10 MEQ tablet Take 10 mEq by mouth daily.       Discharge Medications: Please see discharge summary for a list of discharge medications.  Relevant Imaging  Results:  Relevant Lab Results:   Additional Information SSN# [id number]  Raina Mina, LCSWA
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
## 2021-08-11 NOTE — ED Triage Notes (Signed)
Pt here via ems with reports of multiple falls. Now complaining of R ribcage pain and R shoulder pain. Pt not on anticoags, denies LOC. States she lost her balance. Pt fell out of her chair and was on the floor all night. States her sister would not help her up.  140/88 HR 121 98% RA CBG 152

## 2021-08-11 NOTE — ED Notes (Signed)
Linens changed and clean brief applied. Mittens applied during IV placement to avoid grabbing.

## 2021-08-12 LAB — CBC
HCT: 45 % (ref 36.0–46.0)
Hemoglobin: 15.5 g/dL — ABNORMAL HIGH (ref 12.0–15.0)
MCH: 31.6 pg (ref 26.0–34.0)
MCHC: 34.4 g/dL (ref 30.0–36.0)
MCV: 91.6 fL (ref 80.0–100.0)
Platelets: 225 10*3/uL (ref 150–400)
RBC: 4.91 MIL/uL (ref 3.87–5.11)
RDW: 13.3 % (ref 11.5–15.5)
WBC: 9.8 10*3/uL (ref 4.0–10.5)
nRBC: 0 % (ref 0.0–0.2)

## 2021-08-12 LAB — BASIC METABOLIC PANEL
Anion gap: 11 (ref 5–15)
BUN: 13 mg/dL (ref 8–23)
CO2: 27 mmol/L (ref 22–32)
Calcium: 9.7 mg/dL (ref 8.9–10.3)
Chloride: 99 mmol/L (ref 98–111)
Creatinine, Ser: 0.81 mg/dL (ref 0.44–1.00)
GFR, Estimated: >60 mL/min (ref >60)
Glucose, Bld: 156 mg/dL — ABNORMAL HIGH (ref 70–99)
Potassium: 3.3 mmol/L — ABNORMAL LOW (ref 3.5–5.1)
Sodium: 137 mmol/L (ref 135–145)

## 2021-08-12 LAB — MAGNESIUM: Magnesium: 1.6 mg/dL — ABNORMAL LOW (ref 1.7–2.4)

## 2021-08-12 MED ORDER — LORAZEPAM 2 MG/ML IJ SOLN
1.0000 mg | Freq: Once | INTRAMUSCULAR | Status: AC
  Administered 2021-08-12: 1 mg via INTRAVENOUS
  Filled 2021-08-12: qty 1

## 2021-08-12 NOTE — ED Notes (Signed)
Pt found by this RN to have head and legs caught between stretcher rails. Pt transferred to hospital bed and bed alarms set. Pt denies any pain and no obvious injury or abnormalities noted by this RN, pt remains able to move all extremities freely with no complaints of pain or discomfort.   Complete linen change and pericare also performed at this time.

## 2021-08-12 NOTE — ED Notes (Signed)
Pt noted to complain and grimace with pain to right shoulder while completing linen change. This RN concerned about possible injury from incident earlier in the night in which pt was caught between bedrails. Dr Betsey Holiday notified of the same and at bedside to assess patient. Pt able to move arm freely with no obvious signs of distress or grimacing. Shoulder also appears intact with no obvious deformity. No repeat Xray ordered at this time d/t recent Xray of same area. Per MD, staff will continue to monitor for any changes warranting additional imaging.

## 2021-08-12 NOTE — ED Provider Notes (Incomplete)
I was called to bedside by nursing regarding this patient who is currently a boarder awaiting nursing home placement. Patient was found to be wandering around the hallway, unable to be verbally redirected.  On my evaluation, patient appears delirious and does not understand reasoning behind needing to return to her room.  I reviewed the patient's prior EKGs which is notable for a prolonged QTc interval at 511.  Given this, we will have to be cautious with antipsychotics and sedation. Will trial 1 time dose of IM geodon due to concern for patient and staff safety.  I reviewed the patient's chart and vital signs.  It appears that she has been tachycardic and mildly hypertensive throughout her emergency department stay with A-fib noted on multiple EKGs.  Her home medications were ordered, though she only received these a few hours ago.  I did review the patient's lab work from earlier this morning which is notable for mild hypokalemia without renal dysfunction. UA appears questionably infected with 21-50 WBCs, will plan to treat with dose of Rocephin in case this is contributing to her delirium.  Given her A-fib with RVR, she was treated with IV fluids and a dose of IV metoprolol with improvement.

## 2021-08-12 NOTE — ED Notes (Signed)
Patient refusing all morning medications, pursing her lips closed and later stating that she is afraid of the medications. This RN attempted multiple times to administer medications with no success.

## 2021-08-12 NOTE — Progress Notes (Signed)
Insurance authorization approved [id number]. CSW contacted Juliann Pulse with admissions at [facility]. Juliann Pulse stated she will get patient a room number and number for report. CSW will update patients nurse when information has been received.

## 2021-08-12 NOTE — ED Notes (Signed)
Patient sleeping will get vitals when she wakes up

## 2021-08-12 NOTE — ED Provider Notes (Signed)
Alerted by nursing staff that patient is complaining of right shoulder pain.  No new fall.  I examined the patient.  No deformity noted.  Range of motion is preserved.  She did have an x-ray performed yesterday that did not show fracture or dislocation.   Orpah Greek, MD 08/12/21 [phone]

## 2021-08-13 ENCOUNTER — Emergency Department (HOSPITAL_COMMUNITY): Payer: Medicare Other

## 2021-08-13 DIAGNOSIS — R319 Hematuria, unspecified: Secondary | ICD-10-CM | POA: Diagnosis not present

## 2021-08-13 DIAGNOSIS — N3281 Overactive bladder: Secondary | ICD-10-CM | POA: Diagnosis not present

## 2021-08-13 DIAGNOSIS — E785 Hyperlipidemia, unspecified: Secondary | ICD-10-CM | POA: Diagnosis not present

## 2021-08-13 DIAGNOSIS — J40 Bronchitis, not specified as acute or chronic: Secondary | ICD-10-CM | POA: Diagnosis not present

## 2021-08-13 DIAGNOSIS — M75101 Unspecified rotator cuff tear or rupture of right shoulder, not specified as traumatic: Secondary | ICD-10-CM | POA: Diagnosis not present

## 2021-08-13 DIAGNOSIS — I4891 Unspecified atrial fibrillation: Secondary | ICD-10-CM | POA: Diagnosis not present

## 2021-08-13 DIAGNOSIS — G8929 Other chronic pain: Secondary | ICD-10-CM | POA: Diagnosis not present

## 2021-08-13 DIAGNOSIS — R2681 Unsteadiness on feet: Secondary | ICD-10-CM | POA: Diagnosis not present

## 2021-08-13 DIAGNOSIS — M4802 Spinal stenosis, cervical region: Secondary | ICD-10-CM | POA: Diagnosis not present

## 2021-08-13 DIAGNOSIS — M6281 Muscle weakness (generalized): Secondary | ICD-10-CM | POA: Diagnosis not present

## 2021-08-13 DIAGNOSIS — Z741 Need for assistance with personal care: Secondary | ICD-10-CM | POA: Diagnosis not present

## 2021-08-13 DIAGNOSIS — R1312 Dysphagia, oropharyngeal phase: Secondary | ICD-10-CM | POA: Diagnosis not present

## 2021-08-13 DIAGNOSIS — R2689 Other abnormalities of gait and mobility: Secondary | ICD-10-CM | POA: Diagnosis not present

## 2021-08-13 DIAGNOSIS — M5459 Other low back pain: Secondary | ICD-10-CM | POA: Diagnosis not present

## 2021-08-13 DIAGNOSIS — M4155 Other secondary scoliosis, thoracolumbar region: Secondary | ICD-10-CM | POA: Diagnosis not present

## 2021-08-13 DIAGNOSIS — E876 Hypokalemia: Secondary | ICD-10-CM | POA: Diagnosis not present

## 2021-08-13 DIAGNOSIS — D0510 Intraductal carcinoma in situ of unspecified breast: Secondary | ICD-10-CM | POA: Diagnosis not present

## 2021-08-13 DIAGNOSIS — S46012D Strain of muscle(s) and tendon(s) of the rotator cuff of left shoulder, subsequent encounter: Secondary | ICD-10-CM | POA: Diagnosis not present

## 2021-08-13 DIAGNOSIS — R451 Restlessness and agitation: Secondary | ICD-10-CM | POA: Diagnosis not present

## 2021-08-13 DIAGNOSIS — K219 Gastro-esophageal reflux disease without esophagitis: Secondary | ICD-10-CM | POA: Diagnosis not present

## 2021-08-13 DIAGNOSIS — M542 Cervicalgia: Secondary | ICD-10-CM | POA: Diagnosis not present

## 2021-08-13 DIAGNOSIS — Z7401 Bed confinement status: Secondary | ICD-10-CM | POA: Diagnosis not present

## 2021-08-13 DIAGNOSIS — F03918 Unspecified dementia, unspecified severity, with other behavioral disturbance: Secondary | ICD-10-CM | POA: Diagnosis not present

## 2021-08-13 DIAGNOSIS — E782 Mixed hyperlipidemia: Secondary | ICD-10-CM | POA: Diagnosis not present

## 2021-08-13 DIAGNOSIS — I4581 Long QT syndrome: Secondary | ICD-10-CM | POA: Diagnosis not present

## 2021-08-13 DIAGNOSIS — Z48811 Encounter for surgical aftercare following surgery on the nervous system: Secondary | ICD-10-CM | POA: Diagnosis not present

## 2021-08-13 DIAGNOSIS — S0990XA Unspecified injury of head, initial encounter: Secondary | ICD-10-CM | POA: Diagnosis not present

## 2021-08-13 DIAGNOSIS — I10 Essential (primary) hypertension: Secondary | ICD-10-CM | POA: Diagnosis not present

## 2021-08-13 DIAGNOSIS — W010XXD Fall on same level from slipping, tripping and stumbling without subsequent striking against object, subsequent encounter: Secondary | ICD-10-CM | POA: Diagnosis not present

## 2021-08-13 DIAGNOSIS — M2550 Pain in unspecified joint: Secondary | ICD-10-CM | POA: Diagnosis not present

## 2021-08-13 DIAGNOSIS — R296 Repeated falls: Secondary | ICD-10-CM | POA: Diagnosis not present

## 2021-08-13 DIAGNOSIS — G4700 Insomnia, unspecified: Secondary | ICD-10-CM | POA: Diagnosis not present

## 2021-08-13 DIAGNOSIS — H04123 Dry eye syndrome of bilateral lacrimal glands: Secondary | ICD-10-CM | POA: Diagnosis not present

## 2021-08-13 DIAGNOSIS — I517 Cardiomegaly: Secondary | ICD-10-CM | POA: Diagnosis not present

## 2021-08-13 DIAGNOSIS — M810 Age-related osteoporosis without current pathological fracture: Secondary | ICD-10-CM | POA: Diagnosis not present

## 2021-08-13 DIAGNOSIS — D0512 Intraductal carcinoma in situ of left breast: Secondary | ICD-10-CM | POA: Diagnosis not present

## 2021-08-13 DIAGNOSIS — C50912 Malignant neoplasm of unspecified site of left female breast: Secondary | ICD-10-CM | POA: Diagnosis not present

## 2021-08-13 DIAGNOSIS — Z043 Encounter for examination and observation following other accident: Secondary | ICD-10-CM | POA: Diagnosis not present

## 2021-08-13 DIAGNOSIS — E860 Dehydration: Secondary | ICD-10-CM | POA: Diagnosis not present

## 2021-08-13 DIAGNOSIS — H9190 Unspecified hearing loss, unspecified ear: Secondary | ICD-10-CM | POA: Diagnosis not present

## 2021-08-13 DIAGNOSIS — R410 Disorientation, unspecified: Secondary | ICD-10-CM | POA: Diagnosis not present

## 2021-08-13 DIAGNOSIS — Z66 Do not resuscitate: Secondary | ICD-10-CM | POA: Diagnosis not present

## 2021-08-13 DIAGNOSIS — N182 Chronic kidney disease, stage 2 (mild): Secondary | ICD-10-CM | POA: Diagnosis not present

## 2021-08-13 DIAGNOSIS — N390 Urinary tract infection, site not specified: Secondary | ICD-10-CM | POA: Diagnosis not present

## 2021-08-13 DIAGNOSIS — M25512 Pain in left shoulder: Secondary | ICD-10-CM | POA: Diagnosis not present

## 2021-08-13 DIAGNOSIS — M47812 Spondylosis without myelopathy or radiculopathy, cervical region: Secondary | ICD-10-CM | POA: Diagnosis not present

## 2021-08-13 DIAGNOSIS — W19XXXD Unspecified fall, subsequent encounter: Secondary | ICD-10-CM | POA: Diagnosis not present

## 2021-08-13 LAB — URINE CULTURE

## 2021-08-13 MED ORDER — ACETAMINOPHEN 325 MG PO TABS
650.0000 mg | ORAL_TABLET | Freq: Once | ORAL | Status: AC
  Administered 2021-08-13: 650 mg via ORAL
  Filled 2021-08-13: qty 2

## 2021-08-13 MED ORDER — CEPHALEXIN 500 MG PO CAPS: 500.0000 mg | ORAL_CAPSULE | Freq: Three times a day (TID) | ORAL | 0 refills | Status: DC

## 2021-08-13 NOTE — ED Provider Notes (Signed)
Patient with history of A-fib with RVR this morning, history of same.  Medications given. Awaiting nursing home placement after a fall several days ago.  Complaining of head and neck pain.  It appears they were not imaged initially after her fall. Per nursing she has been refusing her medications.  Being treated for UTI.  CT head negative for acute traumatic pathology.  CT C-spine as above shows multilevel cervical stenosis and foraminal stenosis and spinal stenosis. D/w Dr. Armandina Gemma. CT is unchanged from 6/19.  Patient does have subtle weakness in her left arm of unknown acuity. She does have a known rotator cuff tear on that side.   Discussed with neurosurgery NP Glenford Peers who reviewed CT images.  He feels all findings are chronic and stable.  States patient would likely not be a surgical candidate given her age and comorbidities.  Does not recommend MRI.  Recommends outpatient follow-up with soft collar  Heart rate has improved to 97. She is ambulatory and taking medications per nursing staff.  Appears to be stable for transfer to nursing facility.   Ezequiel Essex, MD 08/13/21 1139

## 2021-08-13 NOTE — ED Notes (Signed)
Rn contacted SW to facilitate pt's transport to facility

## 2021-08-13 NOTE — ED Notes (Signed)
Rn called report to nurse at [facility] and [place] called for transport

## 2021-08-13 NOTE — Progress Notes (Signed)
Patient is going to [facility], number for report [phone]

## 2021-08-13 NOTE — ED Notes (Addendum)
Pt up and ambulated to restroom with one person assist. Complaining of pain all over. Pocketing food but did drink water without difficulty. Pulse is 115. She has moments of lucidity. EDP at bedside.

## 2021-08-13 NOTE — ED Notes (Addendum)
Pt had wet brief. Ambulated to restroom with one person assist and urinated with no difficulty. New brief placed. Complaining of "hurting all over especially her neck". EDP at bedside to assess.

## 2021-08-13 NOTE — Discharge Instructions (Signed)
Your testing is reassuring.  Take your medications as prescribed and follow-up with your doctor.  Return to the ED with any new or worsening symptoms

## 2021-08-13 NOTE — ED Notes (Signed)
RN attempted to encourage pt to eat. She continues to say she has no appetite. She reports food just doesn't taste the same. RN reassured her this happens with normal aging. Offered her ice cream- she said maybe later.

## 2021-08-13 NOTE — ED Notes (Signed)
Pt was able to swallow pills but with a lot of prompting. PT will likely do better with applesauce or pudding or crushed.

## 2021-08-16 DIAGNOSIS — I4891 Unspecified atrial fibrillation: Secondary | ICD-10-CM | POA: Diagnosis not present

## 2021-08-16 DIAGNOSIS — W010XXD Fall on same level from slipping, tripping and stumbling without subsequent striking against object, subsequent encounter: Secondary | ICD-10-CM | POA: Diagnosis not present

## 2021-08-16 DIAGNOSIS — E876 Hypokalemia: Secondary | ICD-10-CM | POA: Diagnosis not present

## 2021-08-16 DIAGNOSIS — M75101 Unspecified rotator cuff tear or rupture of right shoulder, not specified as traumatic: Secondary | ICD-10-CM | POA: Diagnosis not present

## 2021-08-16 DIAGNOSIS — M4802 Spinal stenosis, cervical region: Secondary | ICD-10-CM | POA: Diagnosis not present

## 2021-08-16 DIAGNOSIS — H04123 Dry eye syndrome of bilateral lacrimal glands: Secondary | ICD-10-CM | POA: Diagnosis not present

## 2021-08-16 DIAGNOSIS — I10 Essential (primary) hypertension: Secondary | ICD-10-CM | POA: Diagnosis not present

## 2021-08-16 DIAGNOSIS — Z66 Do not resuscitate: Secondary | ICD-10-CM | POA: Diagnosis not present

## 2021-08-16 DIAGNOSIS — E785 Hyperlipidemia, unspecified: Secondary | ICD-10-CM | POA: Diagnosis not present

## 2021-08-18 DIAGNOSIS — N390 Urinary tract infection, site not specified: Secondary | ICD-10-CM | POA: Diagnosis not present

## 2021-08-18 DIAGNOSIS — R451 Restlessness and agitation: Secondary | ICD-10-CM | POA: Diagnosis not present

## 2021-08-18 DIAGNOSIS — M542 Cervicalgia: Secondary | ICD-10-CM | POA: Diagnosis not present

## 2021-08-18 DIAGNOSIS — R319 Hematuria, unspecified: Secondary | ICD-10-CM | POA: Diagnosis not present

## 2021-08-18 DIAGNOSIS — G4700 Insomnia, unspecified: Secondary | ICD-10-CM | POA: Diagnosis not present

## 2021-08-19 DIAGNOSIS — E785 Hyperlipidemia, unspecified: Secondary | ICD-10-CM | POA: Diagnosis not present

## 2021-08-19 DIAGNOSIS — C50912 Malignant neoplasm of unspecified site of left female breast: Secondary | ICD-10-CM | POA: Diagnosis not present

## 2021-08-19 DIAGNOSIS — I10 Essential (primary) hypertension: Secondary | ICD-10-CM | POA: Diagnosis not present

## 2021-08-20 DIAGNOSIS — Z66 Do not resuscitate: Secondary | ICD-10-CM | POA: Diagnosis not present

## 2021-08-20 DIAGNOSIS — E876 Hypokalemia: Secondary | ICD-10-CM | POA: Diagnosis not present

## 2021-08-20 DIAGNOSIS — I10 Essential (primary) hypertension: Secondary | ICD-10-CM | POA: Diagnosis not present

## 2021-08-20 DIAGNOSIS — E785 Hyperlipidemia, unspecified: Secondary | ICD-10-CM | POA: Diagnosis not present

## 2021-08-20 DIAGNOSIS — W010XXD Fall on same level from slipping, tripping and stumbling without subsequent striking against object, subsequent encounter: Secondary | ICD-10-CM | POA: Diagnosis not present

## 2021-08-20 DIAGNOSIS — M4802 Spinal stenosis, cervical region: Secondary | ICD-10-CM | POA: Diagnosis not present

## 2021-08-20 DIAGNOSIS — H04123 Dry eye syndrome of bilateral lacrimal glands: Secondary | ICD-10-CM | POA: Diagnosis not present

## 2021-08-20 DIAGNOSIS — M75101 Unspecified rotator cuff tear or rupture of right shoulder, not specified as traumatic: Secondary | ICD-10-CM | POA: Diagnosis not present

## 2021-08-20 DIAGNOSIS — I4891 Unspecified atrial fibrillation: Secondary | ICD-10-CM | POA: Diagnosis not present

## 2021-08-24 DIAGNOSIS — F03918 Unspecified dementia, unspecified severity, with other behavioral disturbance: Secondary | ICD-10-CM | POA: Diagnosis not present

## 2021-08-24 DIAGNOSIS — R451 Restlessness and agitation: Secondary | ICD-10-CM | POA: Diagnosis not present

## 2021-08-25 ENCOUNTER — Other Ambulatory Visit: Payer: Self-pay | Admitting: *Deleted

## 2021-08-25 NOTE — Patient Outreach (Signed)
[facility] Post- Acute Care Coordinator follow up. Per [place] eligible member currently resides in [facility].  Screened for potential care coordination/care management services as a benefit of member's insurance plan.  Member's PCP at [facility] at [facility] has [facility] care management services.   Facility site visit to [facility] skilled nursing facility. Met with Judy [name] and Judy [name] in social work department to discuss transition plans. Judy [name] reports [name]' family is working on ALF.   No identifiable care coordination needs at this time.   Marthenia Rolling, MSN, RN,BSN [place] Acute Care Coordinator [phone] [facility]) [phone]  (Toll free office)

## 2021-08-27 DIAGNOSIS — M542 Cervicalgia: Secondary | ICD-10-CM | POA: Diagnosis not present

## 2021-08-27 DIAGNOSIS — R2681 Unsteadiness on feet: Secondary | ICD-10-CM | POA: Diagnosis not present

## 2021-08-27 DIAGNOSIS — M25512 Pain in left shoulder: Secondary | ICD-10-CM | POA: Diagnosis not present

## 2021-08-27 DIAGNOSIS — F03918 Unspecified dementia, unspecified severity, with other behavioral disturbance: Secondary | ICD-10-CM | POA: Diagnosis not present

## 2021-08-30 DIAGNOSIS — F03918 Unspecified dementia, unspecified severity, with other behavioral disturbance: Secondary | ICD-10-CM | POA: Diagnosis not present

## 2021-08-30 DIAGNOSIS — M542 Cervicalgia: Secondary | ICD-10-CM | POA: Diagnosis not present

## 2021-08-31 DIAGNOSIS — R451 Restlessness and agitation: Secondary | ICD-10-CM | POA: Diagnosis not present

## 2021-08-31 DIAGNOSIS — Z66 Do not resuscitate: Secondary | ICD-10-CM | POA: Diagnosis not present

## 2021-08-31 DIAGNOSIS — F03918 Unspecified dementia, unspecified severity, with other behavioral disturbance: Secondary | ICD-10-CM | POA: Diagnosis not present

## 2021-08-31 DIAGNOSIS — G4700 Insomnia, unspecified: Secondary | ICD-10-CM | POA: Diagnosis not present

## 2021-08-31 DIAGNOSIS — E785 Hyperlipidemia, unspecified: Secondary | ICD-10-CM | POA: Diagnosis not present

## 2021-08-31 DIAGNOSIS — M75101 Unspecified rotator cuff tear or rupture of right shoulder, not specified as traumatic: Secondary | ICD-10-CM | POA: Diagnosis not present

## 2021-08-31 DIAGNOSIS — M542 Cervicalgia: Secondary | ICD-10-CM | POA: Diagnosis not present

## 2021-08-31 DIAGNOSIS — E876 Hypokalemia: Secondary | ICD-10-CM | POA: Diagnosis not present

## 2021-08-31 DIAGNOSIS — R2681 Unsteadiness on feet: Secondary | ICD-10-CM | POA: Diagnosis not present

## 2021-08-31 DIAGNOSIS — M4802 Spinal stenosis, cervical region: Secondary | ICD-10-CM | POA: Diagnosis not present

## 2021-08-31 DIAGNOSIS — I4891 Unspecified atrial fibrillation: Secondary | ICD-10-CM | POA: Diagnosis not present

## 2021-08-31 DIAGNOSIS — I10 Essential (primary) hypertension: Secondary | ICD-10-CM | POA: Diagnosis not present

## 2021-09-01 ENCOUNTER — Other Ambulatory Visit: Payer: Self-pay | Admitting: *Deleted

## 2021-09-01 NOTE — Patient Outreach (Signed)
[facility] Post- Acute Care Coordinator follow up.   Verified with [place] department that [name] transitioned to [facility] ALF.  No identifiable care management/care coordination needs.   Judy Rolling, MSN, RN,BSN [place] Acute Care Coordinator [phone] [facility]) [phone]  (Toll free office)

## 2021-09-02 DIAGNOSIS — I4891 Unspecified atrial fibrillation: Secondary | ICD-10-CM | POA: Diagnosis not present

## 2021-09-02 DIAGNOSIS — F03918 Unspecified dementia, unspecified severity, with other behavioral disturbance: Secondary | ICD-10-CM | POA: Diagnosis not present

## 2021-09-02 DIAGNOSIS — E860 Dehydration: Secondary | ICD-10-CM | POA: Diagnosis not present

## 2021-09-02 DIAGNOSIS — M542 Cervicalgia: Secondary | ICD-10-CM | POA: Diagnosis not present

## 2021-09-02 DIAGNOSIS — E785 Hyperlipidemia, unspecified: Secondary | ICD-10-CM | POA: Diagnosis not present

## 2021-09-02 DIAGNOSIS — M4800 Spinal stenosis, site unspecified: Secondary | ICD-10-CM | POA: Diagnosis not present

## 2021-09-02 DIAGNOSIS — I10 Essential (primary) hypertension: Secondary | ICD-10-CM | POA: Diagnosis not present

## 2021-09-02 DIAGNOSIS — Z853 Personal history of malignant neoplasm of breast: Secondary | ICD-10-CM | POA: Diagnosis not present

## 2021-09-02 DIAGNOSIS — Z9181 History of falling: Secondary | ICD-10-CM | POA: Diagnosis not present

## 2021-09-02 DIAGNOSIS — N390 Urinary tract infection, site not specified: Secondary | ICD-10-CM | POA: Diagnosis not present

## 2021-09-02 DIAGNOSIS — M25512 Pain in left shoulder: Secondary | ICD-10-CM | POA: Diagnosis not present

## 2021-09-07 DIAGNOSIS — I4891 Unspecified atrial fibrillation: Secondary | ICD-10-CM | POA: Diagnosis not present

## 2021-09-07 DIAGNOSIS — N390 Urinary tract infection, site not specified: Secondary | ICD-10-CM | POA: Diagnosis not present

## 2021-09-07 DIAGNOSIS — M4800 Spinal stenosis, site unspecified: Secondary | ICD-10-CM | POA: Diagnosis not present

## 2021-09-07 DIAGNOSIS — I10 Essential (primary) hypertension: Secondary | ICD-10-CM | POA: Diagnosis not present

## 2021-09-07 DIAGNOSIS — E860 Dehydration: Secondary | ICD-10-CM | POA: Diagnosis not present

## 2021-09-07 DIAGNOSIS — M542 Cervicalgia: Secondary | ICD-10-CM | POA: Diagnosis not present

## 2021-09-07 DIAGNOSIS — F03918 Unspecified dementia, unspecified severity, with other behavioral disturbance: Secondary | ICD-10-CM | POA: Diagnosis not present

## 2021-09-07 DIAGNOSIS — Z9181 History of falling: Secondary | ICD-10-CM | POA: Diagnosis not present

## 2021-09-07 DIAGNOSIS — E785 Hyperlipidemia, unspecified: Secondary | ICD-10-CM | POA: Diagnosis not present

## 2021-09-07 DIAGNOSIS — Z853 Personal history of malignant neoplasm of breast: Secondary | ICD-10-CM | POA: Diagnosis not present

## 2021-09-07 DIAGNOSIS — M25512 Pain in left shoulder: Secondary | ICD-10-CM | POA: Diagnosis not present

## 2021-09-09 DIAGNOSIS — M25512 Pain in left shoulder: Secondary | ICD-10-CM | POA: Diagnosis not present

## 2021-09-09 DIAGNOSIS — M4800 Spinal stenosis, site unspecified: Secondary | ICD-10-CM | POA: Diagnosis not present

## 2021-09-09 DIAGNOSIS — E785 Hyperlipidemia, unspecified: Secondary | ICD-10-CM | POA: Diagnosis not present

## 2021-09-09 DIAGNOSIS — M542 Cervicalgia: Secondary | ICD-10-CM | POA: Diagnosis not present

## 2021-09-09 DIAGNOSIS — I4891 Unspecified atrial fibrillation: Secondary | ICD-10-CM | POA: Diagnosis not present

## 2021-09-09 DIAGNOSIS — Z9181 History of falling: Secondary | ICD-10-CM | POA: Diagnosis not present

## 2021-09-09 DIAGNOSIS — N390 Urinary tract infection, site not specified: Secondary | ICD-10-CM | POA: Diagnosis not present

## 2021-09-09 DIAGNOSIS — E860 Dehydration: Secondary | ICD-10-CM | POA: Diagnosis not present

## 2021-09-09 DIAGNOSIS — I10 Essential (primary) hypertension: Secondary | ICD-10-CM | POA: Diagnosis not present

## 2021-09-09 DIAGNOSIS — F03918 Unspecified dementia, unspecified severity, with other behavioral disturbance: Secondary | ICD-10-CM | POA: Diagnosis not present

## 2021-09-09 DIAGNOSIS — Z853 Personal history of malignant neoplasm of breast: Secondary | ICD-10-CM | POA: Diagnosis not present

## 2021-09-14 DIAGNOSIS — M4800 Spinal stenosis, site unspecified: Secondary | ICD-10-CM | POA: Diagnosis not present

## 2021-09-14 DIAGNOSIS — E876 Hypokalemia: Secondary | ICD-10-CM | POA: Diagnosis not present

## 2021-09-14 DIAGNOSIS — E860 Dehydration: Secondary | ICD-10-CM | POA: Diagnosis not present

## 2021-09-14 DIAGNOSIS — F03918 Unspecified dementia, unspecified severity, with other behavioral disturbance: Secondary | ICD-10-CM | POA: Diagnosis not present

## 2021-09-14 DIAGNOSIS — M25512 Pain in left shoulder: Secondary | ICD-10-CM | POA: Diagnosis not present

## 2021-09-14 DIAGNOSIS — R2689 Other abnormalities of gait and mobility: Secondary | ICD-10-CM | POA: Diagnosis not present

## 2021-09-14 DIAGNOSIS — M542 Cervicalgia: Secondary | ICD-10-CM | POA: Diagnosis not present

## 2021-09-14 DIAGNOSIS — I10 Essential (primary) hypertension: Secondary | ICD-10-CM | POA: Diagnosis not present

## 2021-09-14 DIAGNOSIS — Z853 Personal history of malignant neoplasm of breast: Secondary | ICD-10-CM | POA: Diagnosis not present

## 2021-09-14 DIAGNOSIS — E785 Hyperlipidemia, unspecified: Secondary | ICD-10-CM | POA: Diagnosis not present

## 2021-09-14 DIAGNOSIS — M5459 Other low back pain: Secondary | ICD-10-CM | POA: Diagnosis not present

## 2021-09-14 DIAGNOSIS — F01B4 Vascular dementia, moderate, with anxiety: Secondary | ICD-10-CM | POA: Diagnosis not present

## 2021-09-14 DIAGNOSIS — I484 Atypical atrial flutter: Secondary | ICD-10-CM | POA: Diagnosis not present

## 2021-09-14 DIAGNOSIS — M6281 Muscle weakness (generalized): Secondary | ICD-10-CM | POA: Diagnosis not present

## 2021-09-14 DIAGNOSIS — N390 Urinary tract infection, site not specified: Secondary | ICD-10-CM | POA: Diagnosis not present

## 2021-09-14 DIAGNOSIS — Z9181 History of falling: Secondary | ICD-10-CM | POA: Diagnosis not present

## 2021-09-14 DIAGNOSIS — I4891 Unspecified atrial fibrillation: Secondary | ICD-10-CM | POA: Diagnosis not present

## 2021-09-14 DIAGNOSIS — M4802 Spinal stenosis, cervical region: Secondary | ICD-10-CM | POA: Diagnosis not present

## 2021-09-15 DIAGNOSIS — Z79899 Other long term (current) drug therapy: Secondary | ICD-10-CM | POA: Diagnosis not present

## 2021-09-15 DIAGNOSIS — I10 Essential (primary) hypertension: Secondary | ICD-10-CM | POA: Diagnosis not present

## 2021-09-16 DIAGNOSIS — E785 Hyperlipidemia, unspecified: Secondary | ICD-10-CM | POA: Diagnosis not present

## 2021-09-16 DIAGNOSIS — M25512 Pain in left shoulder: Secondary | ICD-10-CM | POA: Diagnosis not present

## 2021-09-16 DIAGNOSIS — I4891 Unspecified atrial fibrillation: Secondary | ICD-10-CM | POA: Diagnosis not present

## 2021-09-16 DIAGNOSIS — Z9181 History of falling: Secondary | ICD-10-CM | POA: Diagnosis not present

## 2021-09-16 DIAGNOSIS — M4800 Spinal stenosis, site unspecified: Secondary | ICD-10-CM | POA: Diagnosis not present

## 2021-09-16 DIAGNOSIS — F03918 Unspecified dementia, unspecified severity, with other behavioral disturbance: Secondary | ICD-10-CM | POA: Diagnosis not present

## 2021-09-16 DIAGNOSIS — I10 Essential (primary) hypertension: Secondary | ICD-10-CM | POA: Diagnosis not present

## 2021-09-16 DIAGNOSIS — M542 Cervicalgia: Secondary | ICD-10-CM | POA: Diagnosis not present

## 2021-09-16 DIAGNOSIS — N390 Urinary tract infection, site not specified: Secondary | ICD-10-CM | POA: Diagnosis not present

## 2021-09-16 DIAGNOSIS — E860 Dehydration: Secondary | ICD-10-CM | POA: Diagnosis not present

## 2021-09-16 DIAGNOSIS — Z853 Personal history of malignant neoplasm of breast: Secondary | ICD-10-CM | POA: Diagnosis not present

## 2021-09-20 ENCOUNTER — Emergency Department (HOSPITAL_COMMUNITY): Payer: Medicare Other

## 2021-09-20 ENCOUNTER — Other Ambulatory Visit: Payer: Self-pay

## 2021-09-20 ENCOUNTER — Inpatient Hospital Stay (HOSPITAL_COMMUNITY)
Admission: EM | Admit: 2021-09-20 | Discharge: 2021-09-23 | DRG: 175 | Disposition: A | Discharge status: Home Health | Payer: Medicare Other | Source: Skilled Nursing Facility | Attending: Internal Medicine | Admitting: Internal Medicine

## 2021-09-20 DIAGNOSIS — R079 Chest pain, unspecified: Secondary | ICD-10-CM | POA: Diagnosis not present

## 2021-09-20 DIAGNOSIS — I248 Other forms of acute ischemic heart disease: Secondary | ICD-10-CM | POA: Diagnosis present

## 2021-09-20 DIAGNOSIS — I2609 Other pulmonary embolism with acute cor pulmonale: Principal | ICD-10-CM | POA: Diagnosis present

## 2021-09-20 DIAGNOSIS — M1990 Unspecified osteoarthritis, unspecified site: Secondary | ICD-10-CM | POA: Diagnosis present

## 2021-09-20 DIAGNOSIS — R609 Edema, unspecified: Secondary | ICD-10-CM | POA: Diagnosis not present

## 2021-09-20 DIAGNOSIS — J90 Pleural effusion, not elsewhere classified: Secondary | ICD-10-CM | POA: Diagnosis not present

## 2021-09-20 DIAGNOSIS — Z79899 Other long term (current) drug therapy: Secondary | ICD-10-CM | POA: Diagnosis not present

## 2021-09-20 DIAGNOSIS — F03B0 Unspecified dementia, moderate, without behavioral disturbance, psychotic disturbance, mood disturbance, and anxiety: Secondary | ICD-10-CM | POA: Diagnosis not present

## 2021-09-20 DIAGNOSIS — I6523 Occlusion and stenosis of bilateral carotid arteries: Secondary | ICD-10-CM | POA: Diagnosis not present

## 2021-09-20 DIAGNOSIS — G8929 Other chronic pain: Secondary | ICD-10-CM | POA: Diagnosis present

## 2021-09-20 DIAGNOSIS — R000 Tachycardia, unspecified: Secondary | ICD-10-CM | POA: Diagnosis present

## 2021-09-20 DIAGNOSIS — M545 Low back pain, unspecified: Secondary | ICD-10-CM | POA: Diagnosis present

## 2021-09-20 DIAGNOSIS — Z9181 History of falling: Secondary | ICD-10-CM

## 2021-09-20 DIAGNOSIS — M25512 Pain in left shoulder: Secondary | ICD-10-CM | POA: Diagnosis not present

## 2021-09-20 DIAGNOSIS — E785 Hyperlipidemia, unspecified: Secondary | ICD-10-CM | POA: Diagnosis present

## 2021-09-20 DIAGNOSIS — Z90710 Acquired absence of both cervix and uterus: Secondary | ICD-10-CM | POA: Diagnosis not present

## 2021-09-20 DIAGNOSIS — E876 Hypokalemia: Secondary | ICD-10-CM | POA: Diagnosis present

## 2021-09-20 DIAGNOSIS — I159 Secondary hypertension, unspecified: Secondary | ICD-10-CM | POA: Diagnosis not present

## 2021-09-20 DIAGNOSIS — I2699 Other pulmonary embolism without acute cor pulmonale: Secondary | ICD-10-CM

## 2021-09-20 DIAGNOSIS — Z7401 Bed confinement status: Secondary | ICD-10-CM | POA: Diagnosis not present

## 2021-09-20 DIAGNOSIS — I10 Essential (primary) hypertension: Secondary | ICD-10-CM | POA: Diagnosis not present

## 2021-09-20 DIAGNOSIS — Z9012 Acquired absence of left breast and nipple: Secondary | ICD-10-CM

## 2021-09-20 DIAGNOSIS — Z888 Allergy status to other drugs, medicaments and biological substances status: Secondary | ICD-10-CM

## 2021-09-20 DIAGNOSIS — I82431 Acute embolism and thrombosis of right popliteal vein: Secondary | ICD-10-CM | POA: Diagnosis present

## 2021-09-20 DIAGNOSIS — I499 Cardiac arrhythmia, unspecified: Secondary | ICD-10-CM | POA: Diagnosis not present

## 2021-09-20 DIAGNOSIS — Z800 Family history of malignant neoplasm of digestive organs: Secondary | ICD-10-CM

## 2021-09-20 DIAGNOSIS — I480 Paroxysmal atrial fibrillation: Secondary | ICD-10-CM | POA: Diagnosis present

## 2021-09-20 DIAGNOSIS — F01B4 Vascular dementia, moderate, with anxiety: Secondary | ICD-10-CM | POA: Diagnosis not present

## 2021-09-20 DIAGNOSIS — K219 Gastro-esophageal reflux disease without esophagitis: Secondary | ICD-10-CM | POA: Diagnosis not present

## 2021-09-20 DIAGNOSIS — J811 Chronic pulmonary edema: Secondary | ICD-10-CM | POA: Diagnosis not present

## 2021-09-20 DIAGNOSIS — Z853 Personal history of malignant neoplasm of breast: Secondary | ICD-10-CM

## 2021-09-20 DIAGNOSIS — I4891 Unspecified atrial fibrillation: Secondary | ICD-10-CM | POA: Diagnosis not present

## 2021-09-20 DIAGNOSIS — Z66 Do not resuscitate: Secondary | ICD-10-CM | POA: Diagnosis present

## 2021-09-20 DIAGNOSIS — M810 Age-related osteoporosis without current pathological fracture: Secondary | ICD-10-CM | POA: Diagnosis not present

## 2021-09-20 DIAGNOSIS — I119 Hypertensive heart disease without heart failure: Secondary | ICD-10-CM | POA: Diagnosis present

## 2021-09-20 DIAGNOSIS — I5031 Acute diastolic (congestive) heart failure: Secondary | ICD-10-CM | POA: Diagnosis not present

## 2021-09-20 DIAGNOSIS — I82441 Acute embolism and thrombosis of right tibial vein: Secondary | ICD-10-CM | POA: Diagnosis present

## 2021-09-20 DIAGNOSIS — R296 Repeated falls: Secondary | ICD-10-CM | POA: Diagnosis present

## 2021-09-20 DIAGNOSIS — R6889 Other general symptoms and signs: Secondary | ICD-10-CM | POA: Diagnosis not present

## 2021-09-20 DIAGNOSIS — K8020 Calculus of gallbladder without cholecystitis without obstruction: Secondary | ICD-10-CM | POA: Diagnosis not present

## 2021-09-20 DIAGNOSIS — M79661 Pain in right lower leg: Secondary | ICD-10-CM | POA: Diagnosis not present

## 2021-09-20 DIAGNOSIS — I82451 Acute embolism and thrombosis of right peroneal vein: Secondary | ICD-10-CM | POA: Diagnosis present

## 2021-09-20 DIAGNOSIS — Z803 Family history of malignant neoplasm of breast: Secondary | ICD-10-CM

## 2021-09-20 DIAGNOSIS — Z743 Need for continuous supervision: Secondary | ICD-10-CM | POA: Diagnosis not present

## 2021-09-20 DIAGNOSIS — S0990XA Unspecified injury of head, initial encounter: Secondary | ICD-10-CM | POA: Diagnosis not present

## 2021-09-20 DIAGNOSIS — I2694 Multiple subsegmental pulmonary emboli without acute cor pulmonale: Secondary | ICD-10-CM | POA: Diagnosis not present

## 2021-09-20 DIAGNOSIS — Z833 Family history of diabetes mellitus: Secondary | ICD-10-CM | POA: Diagnosis not present

## 2021-09-20 DIAGNOSIS — M19012 Primary osteoarthritis, left shoulder: Secondary | ICD-10-CM | POA: Diagnosis not present

## 2021-09-20 DIAGNOSIS — R531 Weakness: Secondary | ICD-10-CM | POA: Diagnosis not present

## 2021-09-20 DIAGNOSIS — M6281 Muscle weakness (generalized): Secondary | ICD-10-CM | POA: Diagnosis not present

## 2021-09-20 DIAGNOSIS — S199XXA Unspecified injury of neck, initial encounter: Secondary | ICD-10-CM | POA: Diagnosis not present

## 2021-09-20 DIAGNOSIS — Z885 Allergy status to narcotic agent status: Secondary | ICD-10-CM

## 2021-09-20 LAB — TROPONIN I (HIGH SENSITIVITY)
Troponin I (High Sensitivity): 23 ng/L — ABNORMAL HIGH (ref <18)
Troponin I (High Sensitivity): 21 ng/L — ABNORMAL HIGH (ref <18)

## 2021-09-20 LAB — CBC WITH DIFFERENTIAL/PLATELET
Abs Immature Granulocytes: 0.07 10*3/uL (ref 0.00–0.07)
Basophils Absolute: 0 10*3/uL (ref 0.0–0.1)
Basophils Relative: 0 %
Eosinophils Absolute: 0 10*3/uL (ref 0.0–0.5)
Eosinophils Relative: 0 %
HCT: 40.5 % (ref 36.0–46.0)
Hemoglobin: 13.6 g/dL (ref 12.0–15.0)
Immature Granulocytes: 1 %
Lymphocytes Relative: 14 %
Lymphs Abs: 1.1 10*3/uL (ref 0.7–4.0)
MCH: 30.6 pg (ref 26.0–34.0)
MCHC: 33.6 g/dL (ref 30.0–36.0)
MCV: 91 fL (ref 80.0–100.0)
Monocytes Absolute: 0.7 10*3/uL (ref 0.1–1.0)
Monocytes Relative: 8 %
Neutro Abs: 6.3 10*3/uL (ref 1.7–7.7)
Neutrophils Relative %: 77 %
Platelets: 447 10*3/uL — ABNORMAL HIGH (ref 150–400)
RBC: 4.45 MIL/uL (ref 3.87–5.11)
RDW: 15.2 % (ref 11.5–15.5)
WBC: 8.2 10*3/uL (ref 4.0–10.5)
nRBC: 0 % (ref 0.0–0.2)

## 2021-09-20 LAB — COMPREHENSIVE METABOLIC PANEL
ALT: 21 U/L (ref 0–44)
AST: 24 U/L (ref 15–41)
Albumin: 2.8 g/dL — ABNORMAL LOW (ref 3.5–5.0)
Alkaline Phosphatase: 141 U/L — ABNORMAL HIGH (ref 38–126)
Anion gap: 9 (ref 5–15)
BUN: 24 mg/dL — ABNORMAL HIGH (ref 8–23)
CO2: 25 mmol/L (ref 22–32)
Calcium: 9.3 mg/dL (ref 8.9–10.3)
Chloride: 101 mmol/L (ref 98–111)
Creatinine, Ser: 0.97 mg/dL (ref 0.44–1.00)
GFR, Estimated: 56 mL/min — ABNORMAL LOW (ref >60)
Glucose, Bld: 112 mg/dL — ABNORMAL HIGH (ref 70–99)
Potassium: 4.5 mmol/L (ref 3.5–5.1)
Sodium: 135 mmol/L (ref 135–145)
Total Bilirubin: 0.9 mg/dL (ref 0.3–1.2)
Total Protein: 6.2 g/dL — ABNORMAL LOW (ref 6.5–8.1)

## 2021-09-20 LAB — URINALYSIS, ROUTINE W REFLEX MICROSCOPIC
Bilirubin Urine: NEGATIVE
Glucose, UA: NEGATIVE mg/dL
Hgb urine dipstick: NEGATIVE
Ketones, ur: NEGATIVE mg/dL
Leukocytes,Ua: NEGATIVE
Nitrite: NEGATIVE
Protein, ur: NEGATIVE mg/dL
Specific Gravity, Urine: 1.013 (ref 1.005–1.030)
pH: 5 (ref 5.0–8.0)

## 2021-09-20 LAB — APTT: aPTT: 29 seconds (ref 24–36)

## 2021-09-20 LAB — MAGNESIUM: Magnesium: 1.7 mg/dL (ref 1.7–2.4)

## 2021-09-20 LAB — PROTIME-INR
INR: 1.1 (ref 0.8–1.2)
Prothrombin Time: 14.1 seconds (ref 11.4–15.2)

## 2021-09-20 MED ORDER — MAGNESIUM SULFATE IN D5W 1-5 GM/100ML-% IV SOLN
1.0000 g | Freq: Once | INTRAVENOUS | Status: AC
  Administered 2021-09-20: 1 g via INTRAVENOUS
  Filled 2021-09-20: qty 100

## 2021-09-20 MED ORDER — HEPARIN BOLUS VIA INFUSION
4000.0000 [IU] | Freq: Once | INTRAVENOUS | Status: AC
  Administered 2021-09-20: 4000 [IU] via INTRAVENOUS
  Filled 2021-09-20: qty 4000

## 2021-09-20 MED ORDER — FUROSEMIDE 10 MG/ML IJ SOLN
20.0000 mg | Freq: Once | INTRAMUSCULAR | Status: AC
  Administered 2021-09-20: 20 mg via INTRAVENOUS
  Filled 2021-09-20: qty 2

## 2021-09-20 MED ORDER — METOPROLOL TARTRATE 5 MG/5ML IV SOLN
5.0000 mg | Freq: Once | INTRAVENOUS | Status: AC
  Administered 2021-09-20: 5 mg via INTRAVENOUS
  Filled 2021-09-20: qty 5

## 2021-09-20 MED ORDER — HEPARIN (PORCINE) 25000 UT/250ML-% IV SOLN
1100.0000 [IU]/h | INTRAVENOUS | Status: AC
  Administered 2021-09-20: 1000 [IU]/h via INTRAVENOUS
  Administered 2021-09-21: 1100 [IU]/h via INTRAVENOUS
  Filled 2021-09-20 (×2): qty 250

## 2021-09-20 MED ORDER — IOHEXOL 350 MG/ML SOLN
100.0000 mL | Freq: Once | INTRAVENOUS | Status: AC | PRN
  Administered 2021-09-20: 100 mL via INTRAVENOUS

## 2021-09-20 MED ORDER — METOPROLOL SUCCINATE ER 25 MG PO TB24
100.0000 mg | ORAL_TABLET | Freq: Once | ORAL | Status: AC
  Administered 2021-09-20: 100 mg via ORAL
  Filled 2021-09-20: qty 4

## 2021-09-20 NOTE — Progress Notes (Signed)
ANTICOAGULATION CONSULT NOTE - Initial Consult  Pharmacy Consult for Heparin Indication: pulmonary embolus  Allergies  Allergen Reactions   Amoxicillin Nausea Only   Codeine Nausea Only   Amlodipine Swelling   Aricept [Donepezil Hcl] Other (See Comments)    Felt drunk and headache   Clonidine Derivatives Other (See Comments)    Mouth dryness   Sertraline Nausea Only   Clavulanic Acid Hives and Rash    Patient Measurements: Height: _0  (157.5 cm) Weight: 64 kg (141 lb 1.5 oz) IBW/kg (Calculated) : 50.1 Heparin Dosing Weight: 63 kg  Vital Signs: Temp: 97.5 F (36.4 C) (08/07 2029) Temp Source: Oral (08/07 2029) BP: 169/117 (08/07 2029) Pulse Rate: 112 (08/07 2029)  Labs: Recent Labs    09/20/21 1419 09/20/21 1616  HGB 13.6  --   HCT 40.5  --   PLT 447*  --   CREATININE 0.97  --   TROPONINIHS 23* 21*    Estimated Creatinine Clearance: 34.6 mL/min (by C-G formula based on SCr of 0.97 mg/dL).   Medical History: Past Medical History:  Diagnosis Date   Blood transfusion    Breast cancer, left ([place]) 09/26/2011   Punch biopsies of the left nipple done on 09/16/2011.  ER and PR were negative.  HER2 is positive with ratio 4.68.Had mastectomy on 10/25/11. Path T1,N0.    Chronic low back pain    Dyslipidemia    GERD (gastroesophageal reflux disease)    HTN (hypertension)    Osteoarthritis    Osteoporosis     Medications:  (Not in a hospital admission)  Scheduled:   metoprolol succinate  100 mg Oral Once   metoprolol tartrate  5 mg Intravenous Once   Infusions:  PRN:   Assessment: 77 yof with a history of remote breast cancer, HTN, HLD, atrial fibrillation, GERD, arthritis. Patient is presenting with weakness. Heparin per pharmacy consult placed for pulmonary embolus.  CTA PE w/ bilateral PE per ED MD note.  Patient is not on anticoagulation prior to arrival.  Hgb 13.6; plt 447  Goal of Therapy:  Heparin level 0.3-0.7 units/ml Monitor platelets by  anticoagulation protocol: Yes   Plan:  Give IV heparin 4000 units bolus x 1 Start heparin infusion at 1000 units/hr Check anti-Xa level in 8 hours and daily while on heparin Continue to monitor H&H and platelets  Lorelei Pont, PharmD, BCPS 09/20/2021 10:07 PM ED Clinical Pharmacist -  [phone]

## 2021-09-20 NOTE — ED Notes (Signed)
Patient assisted back into the bed by this RN and CT tech and [employer]. Patient sustained no obvious external injuries and able to help walk to the bed

## 2021-09-20 NOTE — H&P (Signed)
History and Physical    Judy [name]:[id number] DOB: 14-Oct-1930 DOA: 09/20/2021  PCP: Mayra Neer, MD  Patient coming from: NH/ALF I have personally briefly reviewed patient's old medical records in [place]  Chief Complaint:  elevated bp , afib rvr   HPI: Judy [name] is a 88 y.o. female with medical history significant of HTN, Afib d/c 08/02/21 on not on AC due to fall risk, GERD, HLD, Dementia , remote BRCA who present from facility with noted increase weakness, elevated blood pressure as well as afib with rvr. Patient is a very poor historian.She however denies any sob, chest pain ,n/v/d /dysuria or abdominal pain.    ED Course:   Patient on evaluation was noted to be pleasantly demented, work significant for  CT with B?L PE. ED course complicated by fall, CTH s/p fall noted to be negative  Patient was started on heparin and discussed with pulmonary who rec f/u in AM. Patient also noted to have spike in hr to 160-180, given home regimen  with improvement in rate to 120's . Patient remained asymptomatic   97.2, BP 154/106, hr 101-160 , rr 16 sat 99% Lab: Wbc 8.2, hgb 13.6, plt 447,  Na:135, K 4.5 ,  alkphos 141 Mag 1.7 Ce23,21 UA neg CT head: NAD Tx lasix 20, mag 1g Review of Systems: As per HPI otherwise 10 point review of systems negative.   Past Medical History:  Diagnosis Date   Blood transfusion    Breast cancer, left ([place]) 09/26/2011   Punch biopsies of the left nipple done on 09/16/2011.  ER and PR were negative.  HER2 is positive with ratio 4.68.Had mastectomy on 10/25/11. Path T1,N0.    Chronic low back pain    Dyslipidemia    GERD (gastroesophageal reflux disease)    HTN (hypertension)    Osteoarthritis    Osteoporosis     Past Surgical History:  Procedure Laterality Date   BACK SURGERY  2009   lumb lam   BREAST SURGERY  2009   lt lumpectomy-plus 2 re-excisions for margins   left breast mastectomy  2013   MASTECTOMY Left    ROTATOR CUFF  REPAIR     Bilateral, 2010   TOTAL ABDOMINAL HYSTERECTOMY       reports that she has never smoked. She has never used smokeless tobacco. She reports that she does not drink alcohol and does not use drugs.  Allergies  Allergen Reactions   Amoxicillin Nausea Only   Codeine Nausea Only   Amlodipine Swelling   Aricept [Donepezil Hcl] Other (See Comments)    Felt drunk and headache   Clonidine Derivatives Other (See Comments)    Mouth dryness   Sertraline Nausea Only   Clavulanic Acid Hives and Rash    Family History  Problem Relation Age of Onset   Cancer Sister        breast   Breast cancer Sister    Colon cancer Brother    Cancer Sister        lung   Diabetes Son    Esophageal cancer Neg Hx    Rectal cancer Neg Hx    Stomach cancer Neg Hx    Prior to Admission medications   Medication Sig Start Date End Date Taking? Authorizing Provider  atorvastatin (LIPITOR) 10 MG tablet Take 1 tablet (10 mg total) by mouth daily at 6 PM. 08/03/21   Little Ishikawa, MD  cephALEXin (KEFLEX) 500 MG capsule Take 1 capsule (500  mg total) by mouth 3 (three) times daily. 08/13/21   Rancour, Annie Main, MD  diltiazem (CARDIZEM CD) 120 MG 24 hr capsule Take 1 capsule (120 mg total) by mouth daily. 08/03/21 08/03/22  Little Ishikawa, MD  lisinopril (PRINIVIL,ZESTRIL) 40 MG tablet Take 40 mg by mouth daily. Patient not taking: Reported on 08/12/2021    [provider]  metoprolol succinate (TOPROL-XL) 100 MG 24 hr tablet Take 100 mg by mouth daily. Take with or immediately following a meal.    [provider]  Multiple Vitamins-Minerals (CENTRUM ADULTS PO) Take 1 tablet by mouth daily.    [provider]  Omega-3 Fatty Acids (FISH OIL) 1000 MG CAPS Take 1,000 mg by mouth daily.    [provider]  Polyethyl Glycol-Propyl Glycol (SYSTANE OP) Place 1 drop into both eyes daily as needed (dry eyes).    [provider]  potassium chloride SA (KLOR-CON M) 20  MEQ tablet Take 20 mEq by mouth daily.    [provider]    Physical Exam: Vitals:   09/20/21 2258 09/20/21 2300 09/20/21 2303 09/20/21 2304  BP: (!) 178/116 (!) 159/120    Pulse: (!) 108 (!) 104 (!) 109   Resp:  (!) 34  20  Temp:      TempSrc:      SpO2:  94% 98%   Weight:      Height:        Vitals:   09/20/21 2258 09/20/21 2300 09/20/21 2303 09/20/21 2304  BP: (!) 178/116 (!) 159/120    Pulse: (!) 108 (!) 104 (!) 109   Resp:  (!) 34  20  Temp:      TempSrc:      SpO2:  94% 98%   Weight:      Height:       Constitutional: NAD, calm, comfortable Eyes: PERRL, lids and conjunctivae normal ENMT: Mucous membranes are moist. Posterior pharynx clear of any exudate or lesions.Normal dentition.  Neck: normal, supple, no masses, no thyromegaly Respiratory: clear to auscultation bilaterally, no wheezing, no crackles. Normal respiratory effort. No accessory muscle use.  Cardiovascular: Regular rate and rhythm, no murmurs / rubs / gallops. + extremity edema. 2+ pedal pulses. Abdomen: no tenderness, no masses palpated. No hepatosplenomegaly. Bowel sounds positive.  Musculoskeletal: no clubbing / cyanosis. No joint deformity upper and lower extremities. Good ROM, no contractures. Normal muscle tone.  Skin: no rashes, lesions, ulcers. No induration Neurologic: CN 2-12 grossly intact. Sensation intact, DTR normal. Strength 5/5 in all 4.  Psychiatric: pleasantly confused,  Labs on Admission: I have personally reviewed following labs and imaging studies  CBC: Recent Labs  Lab 09/20/21 1419  WBC 8.2  NEUTROABS 6.3  HGB 13.6  HCT 40.5  MCV 91.0  PLT 161*   Basic Metabolic Panel: Recent Labs  Lab 09/20/21 1419  NA 135  K 4.5  CL 101  CO2 25  GLUCOSE 112*  BUN 24*  CREATININE 0.97  CALCIUM 9.3  MG 1.7   GFR: Estimated Creatinine Clearance: 34.6 mL/min (by C-G formula based on SCr of 0.97 mg/dL). Liver Function Tests: Recent Labs  Lab 09/20/21 1419  AST 24   ALT 21  ALKPHOS 141*  BILITOT 0.9  PROT 6.2*  ALBUMIN 2.8*   No results for input(s): "LIPASE", "AMYLASE" in the last 168 hours. No results for input(s): "AMMONIA" in the last 168 hours. Coagulation Profile: Recent Labs  Lab 09/20/21 2256  INR 1.1   Cardiac Enzymes: No results  for input(s): "CKTOTAL", "CKMB", "CKMBINDEX", "TROPONINI" in the last 168 hours. BNP (last 3 results) No results for input(s): "PROBNP" in the last 8760 hours. HbA1C: No results for input(s): "HGBA1C" in the last 72 hours. CBG: No results for input(s): "GLUCAP" in the last 168 hours. Lipid Profile: No results for input(s): "CHOL", "HDL", "LDLCALC", "TRIG", "CHOLHDL", "LDLDIRECT" in the last 72 hours. Thyroid Function Tests: No results for input(s): "TSH", "T4TOTAL", "FREET4", "T3FREE", "THYROIDAB" in the last 72 hours. Anemia Panel: No results for input(s): "VITAMINB12", "FOLATE", "FERRITIN", "TIBC", "IRON", "RETICCTPCT" in the last 72 hours. Urine analysis:    Component Value Date/Time   COLORURINE YELLOW 09/20/2021 1809   APPEARANCEUR CLEAR 09/20/2021 1809   LABSPEC 1.013 09/20/2021 1809   PHURINE 5.0 09/20/2021 1809   GLUCOSEU NEGATIVE 09/20/2021 1809   HGBUR NEGATIVE 09/20/2021 1809   BILIRUBINUR NEGATIVE 09/20/2021 1809   KETONESUR NEGATIVE 09/20/2021 1809   PROTEINUR NEGATIVE 09/20/2021 1809   NITRITE NEGATIVE 09/20/2021 1809   LEUKOCYTESUR NEGATIVE 09/20/2021 1809    Radiological Exams on Admission: CT Angio Chest PE W and/or Wo Contrast  Result Date: 09/20/2021 CLINICAL DATA:  Progressive weakness for 2 weeks, lower extremity edema EXAM: CT ANGIOGRAPHY CHEST WITH CONTRAST TECHNIQUE: Multidetector CT imaging of the chest was performed using the standard protocol during bolus administration of intravenous contrast. Multiplanar CT image reconstructions and MIPs were obtained to evaluate the vascular anatomy. RADIATION DOSE REDUCTION: This exam was performed according to the departmental  dose-optimization program which includes automated exposure control, adjustment of the mA and/or kV according to patient size and/or use of iterative reconstruction technique. CONTRAST:  177m OMNIPAQUE IOHEXOL 350 MG/ML SOLN COMPARISON:  09/20/2021 FINDINGS: Cardiovascular: This is a technically adequate evaluation of the pulmonary vasculature. There is a large pulmonary embolus within the right main pulmonary artery, with complete occlusion of the right middle and right lower lobe pulmonary arteries as result. Central embolus is seen within the left main pulmonary artery, extending into the segmental branches. There is a large clot burden, with evidence of right ventricular strain with RV/LV ratio measuring 1.48. There is significant right atrial dilation with reflux of contrast into the hepatic veins consistent with cardiac dysfunction. No pericardial effusion. Normal caliber of the thoracic aorta. Atherosclerosis of the aorta and coronary vasculature. Mediastinum/Nodes: No enlarged mediastinal, hilar, or axillary lymph nodes. Thyroid gland, trachea, and esophagus demonstrate no significant findings. Lungs/Pleura: Dense right middle and right lower lobe consolidation likely reflect pulmonary infarct given large central pulmonary emboli. Moderate right pleural effusion measures less than 1 L. No pneumothorax. Upper Abdomen: Calcified gallstones. No other acute upper abdominal findings. Musculoskeletal: No acute or destructive bony lesions. Reconstructed images demonstrate no additional findings. Review of the MIP images confirms the above findings. IMPRESSION: 1. Large central and segmental bilateral pulmonary emboli, most pronounced within the right main pulmonary artery as above. CT evidence of right heart strain (RV/LV Ratio = 1.48) consistent with at least submassive (intermediate risk) PE. The presence of right heart strain has been associated with an increased risk of morbidity and mortality. Please refer to  the "Code PE Focused" order set in EPIC. 2. Dense right middle and right lower lobe consolidation most consistent with pulmonary infarct given central pulmonary emboli. 3. Moderate right pleural effusion measures less than 1 L. 4.  Aortic Atherosclerosis (ICD10-I70.0). 5. Cholelithiasis. Critical Value/emergent results were called by telephone at the time of interpretation on 09/20/2021 at 10:17 pm to [facility] who verbally acknowledged these results. Electronically Signed  By: Randa Ngo M.D.   On: 09/20/2021 22:19   CT HEAD WO CONTRAST (5MM)  Result Date: 09/20/2021 CLINICAL DATA:  Head trauma, minor (Age >= 65y); Neck trauma (Age >= 65y) EXAM: CT HEAD WITHOUT CONTRAST CT CERVICAL SPINE WITHOUT CONTRAST TECHNIQUE: Multidetector CT imaging of the head and cervical spine was performed following the standard protocol without intravenous contrast. Multiplanar CT image reconstructions of the cervical spine were also generated. RADIATION DOSE REDUCTION: This exam was performed according to the departmental dose-optimization program which includes automated exposure control, adjustment of the mA and/or kV according to patient size and/or use of iterative reconstruction technique. COMPARISON:  None Available. FINDINGS: CT HEAD FINDINGS BRAIN: BRAIN Cerebral ventricle sizes are concordant with the degree of cerebral volume loss. Patchy and confluent areas of decreased attenuation are noted throughout the deep and periventricular white matter of the cerebral hemispheres bilaterally, compatible with chronic microvascular ischemic disease. No evidence of large-territorial acute infarction. No parenchymal hemorrhage. No mass lesion. No extra-axial collection. No mass effect or midline shift. No hydrocephalus. Basilar cisterns are patent. Vascular: No hyperdense vessel. Skull: No acute fracture or focal lesion. Sinuses/Orbits: Paranasal sinuses and mastoid air cells are clear. Bilateral lens replacement.  Otherwise The orbits are unremarkable. Other: None. CT CERVICAL SPINE FINDINGS Alignment: Normal. Skull base and vertebrae: Multilevel severe degenerative changes of the spine. No severe osseous neural foraminal or central canal stenosis. No acute fracture. No aggressive appearing focal osseous lesion or focal pathologic process. Soft tissues and spinal canal: No prevertebral fluid or swelling. No visible canal hematoma. Upper chest: Unremarkable. Other: Right pleural effusion. Please see separately dictated CT angiography 10/10/2021 chest for positive findings. Atherosclerotic plaque of the carotid arteries within the neck. IMPRESSION: 1. No acute intracranial abnormality. 2. No acute displaced fracture or traumatic listhesis of the cervical spine. 3. Right pleural effusion. Please see separately dictated CT angiography 10/10/2021 chest for positive findings. Electronically Signed   By: Iven Finn M.D.   On: 09/20/2021 22:13   CT Cervical Spine Wo Contrast  Result Date: 09/20/2021 CLINICAL DATA:  Head trauma, minor (Age >= 65y); Neck trauma (Age >= 65y) EXAM: CT HEAD WITHOUT CONTRAST CT CERVICAL SPINE WITHOUT CONTRAST TECHNIQUE: Multidetector CT imaging of the head and cervical spine was performed following the standard protocol without intravenous contrast. Multiplanar CT image reconstructions of the cervical spine were also generated. RADIATION DOSE REDUCTION: This exam was performed according to the departmental dose-optimization program which includes automated exposure control, adjustment of the mA and/or kV according to patient size and/or use of iterative reconstruction technique. COMPARISON:  None Available. FINDINGS: CT HEAD FINDINGS BRAIN: BRAIN Cerebral ventricle sizes are concordant with the degree of cerebral volume loss. Patchy and confluent areas of decreased attenuation are noted throughout the deep and periventricular white matter of the cerebral hemispheres bilaterally, compatible with  chronic microvascular ischemic disease. No evidence of large-territorial acute infarction. No parenchymal hemorrhage. No mass lesion. No extra-axial collection. No mass effect or midline shift. No hydrocephalus. Basilar cisterns are patent. Vascular: No hyperdense vessel. Skull: No acute fracture or focal lesion. Sinuses/Orbits: Paranasal sinuses and mastoid air cells are clear. Bilateral lens replacement. Otherwise The orbits are unremarkable. Other: None. CT CERVICAL SPINE FINDINGS Alignment: Normal. Skull base and vertebrae: Multilevel severe degenerative changes of the spine. No severe osseous neural foraminal or central canal stenosis. No acute fracture. No aggressive appearing focal osseous lesion or focal pathologic process. Soft tissues and spinal canal: No prevertebral fluid or swelling. No visible  canal hematoma. Upper chest: Unremarkable. Other: Right pleural effusion. Please see separately dictated CT angiography 10/10/2021 chest for positive findings. Atherosclerotic plaque of the carotid arteries within the neck. IMPRESSION: 1. No acute intracranial abnormality. 2. No acute displaced fracture or traumatic listhesis of the cervical spine. 3. Right pleural effusion. Please see separately dictated CT angiography 10/10/2021 chest for positive findings. Electronically Signed   By: Iven Finn M.D.   On: 09/20/2021 22:13   DG Shoulder Left  Result Date: 09/20/2021 CLINICAL DATA:  Left shoulder pain EXAM: LEFT SHOULDER - 2+ VIEW COMPARISON:  August 02, 2021 FINDINGS: Diffuse osteopenia. No acute fracture or dislocation. Moderate glenohumeral and acromioclavicular joint degenerative changes. Decreased subacromial space, suggestive of chronic rotator cuff pathology. The visualized left lung is clear. IMPRESSION: 1. No acute fracture or dislocation. 2. Left glenohumeral and acromioclavicular degenerative changes, similar to prior. Electronically Signed   By: Beryle Flock M.D.   On: 09/20/2021 14:08    DG Chest Portable 1 View  Result Date: 09/20/2021 CLINICAL DATA:  Left shoulder pain, chest pain EXAM: PORTABLE CHEST 1 VIEW COMPARISON:  08/13/2021 FINDINGS: Right lower and middle lobe opacities. Central pulmonary vascular congestion. Small right pleural effusion is not excluded. No pneumothorax. Cardiomegaly. IMPRESSION: Central pulmonary vascular congestion with right lower and middle lobe opacities. Small right pleural effusion is not excluded. Mild cardiomegaly. May reflect pneumonia or edema. Electronically Signed   By: Macy Mis M.D.   On: 09/20/2021 14:05    EKG: Independently reviewed.   Assessment/Plan  B/L moderate to large burden Pulmonary Emobli -admit to progressive care - cont heparin drip  -echo pending to further evaluate heart strain noted on CTPA  - I R consult  for evaluation of intervention  Afib RVR -short live burst -resume home meds  metoprolol and cardizem  -pt was  not on AC due to concern for falls   HTN -uncontrolled  -resume home regimen   GERD -ppi   HLD -cont statin   Dementia -place on tele sitter  -for risk  of falls   DVT prophylaxis:  heparin drip Code Status: DNR  Family Communication:  Ron,Doris (Daughter)  [phone] (Mobile Disposition Plan:  Consults called:  IR  Admission status: progressive care   Clance Boll MD [facility]   If 7PM-7AM, please contact night-coverage [email or web address] Password [facility]  09/20/2021, 11:33 PM

## 2021-09-20 NOTE — ED Provider Notes (Addendum)
  Physical Exam  BP (!) 154/111   Pulse 92   Temp (!) 97.2 F (36.2 C) (Oral)   Resp 12   SpO2 96%   Physical Exam  Procedures  .Critical Care  Performed by: Lucrezia Starch, MD Authorized by: Lucrezia Starch, MD   Critical care provider statement:    Critical care time (minutes):  38   Critical care was time spent personally by me on the following activities:  Development of treatment plan with patient or surrogate, discussions with consultants, evaluation of patient's response to treatment, examination of patient, ordering and review of laboratory studies, ordering and review of radiographic studies, ordering and performing treatments and interventions, pulse oximetry, re-evaluation of patient's condition and review of old charts   ED Course / MDM   Clinical Course as of 09/20/21 2204  Mon Sep 20, 2021  2020 I was informed by nursing staff that patient fell. She was reportedly seated on the bedside commode and stated that she tried to get up causing her to fall.  She reportedly struck the right posterior side of her head on the wall. At the time of my evaluation she had been placed back into bed.  C-spine precautions were implemented upon my arrival, she is placed in a hard collar. She is not anticoagulated on my review of her meds list both at bedside and in the computer. CT head and CT neck are ordered.  She reports pain in her right lateral lower leg and denies any other significant injuries. Her right lower leg compartments are soft and easily compressible. X-ray is ordered. My role in patient's care was limited to evaluation after she fell in the room  [EH]    Clinical Course User Index [EH] Lorin Glass, PA-C   Medical Decision Making Amount and/or Complexity of Data Reviewed Labs: ordered. Radiology: ordered.  Risk Prescription drug management. Decision regarding hospitalization.   88 year old presenting to ER for general weakness, history of  A-fib, reportedly elevated heart rate prior to arrival.  Here her rate is controlled.  Her workup thus far has been notable for slight elevation in troponin.  CXR concerning for pneumonia versus edema.  Patient not on anticoagulation, will check CTA chest to evaluate for pneumonia, edema or pulmonary embolism.  Will recheck troponin.  If CT reassuring, troponin stable, rate remains controlled, discharge likely back to facility.  Patient had a fall, Benjamine Mola PA went promptly to bedside after this happened and evaluated patient, added CT head and CT C-spine.  I went and evaluated patient subsequently, no obvious trauma noted but feel she would benefit from these scans.  I have reviewed CT of her head, no acute intracranial bleeding noted.  I have reviewed her CT scan of her chest, obvious bilateral pulmonary embolism present, no saddle.  Will start heparin, admit to medicine.   Lucrezia Starch, MD 09/20/21 2206    Lucrezia Starch, MD 09/20/21 2220

## 2021-09-20 NOTE — ED Notes (Addendum)
CT called by this RN to ensure that the patient gets the appropriate scans. Charge nurse Jinny Blossom made aware of the unwitnessed fall along with primary nurse Amy. Post Fall huddle completed by this RN

## 2021-09-20 NOTE — ED Provider Notes (Addendum)
[facility] EMERGENCY DEPARTMENT Provider Note   CSN: [id number] Arrival date & time: 09/20/21  1248     History  Chief Complaint  Patient presents with   Weakness    Judy [name] is a 87 y.o. female.   Weakness Associated symptoms: no arthralgias, no chest pain, no fever, no myalgias, no nausea and no shortness of breath   Patient presents for tachycardia.  Medical history includes remote breast cancer, HTN, HLD, atrial fibrillation, GERD, arthritis.  History per staff: Patient was evaluated by on-site doctor. She had elevated blood pressure and heart rate.  There was concern of A-fib with RVR.  Heart rate at the time was in the range of 120.  Staff confirmed that patient has been taking her Cardizem and metoprolol.  EMS reports that patient's heart rate fluctuated between 100 and 120.  She remained normotensive during transit.  Patient currently endorses left shoulder pain.  She states that this is a chronic issue for her.  She does believe that she has had increased leg swelling.  She states that she has been having issues with ambulation lately.  At times, she feels that she is going to fall forward.  She denies any recent falls.  She does currently walk with a walker.    Per chart review, patient did present to the ED for a fall 2 months ago.  Prior to that, she had a brief hospitalization in June and was diagnosed with atrial fibrillation at that time.  Discussion was had regarding Eliquis.  It was determined that, given her fall risk, risks outweighed benefits.     Home Medications Prior to Admission medications   Medication Sig Start Date End Date Taking? Authorizing Provider  atorvastatin (LIPITOR) 10 MG tablet Take 1 tablet (10 mg total) by mouth daily at 6 PM. 08/03/21   Little Ishikawa, MD  cephALEXin (KEFLEX) 500 MG capsule Take 1 capsule (500 mg total) by mouth 3 (three) times daily. 08/13/21   Rancour, Annie Main, MD  diltiazem (CARDIZEM CD) 120 MG  24 hr capsule Take 1 capsule (120 mg total) by mouth daily. 08/03/21 08/03/22  Little Ishikawa, MD  lisinopril (PRINIVIL,ZESTRIL) 40 MG tablet Take 40 mg by mouth daily. Patient not taking: Reported on 08/12/2021    [provider]  metoprolol succinate (TOPROL-XL) 100 MG 24 hr tablet Take 100 mg by mouth daily. Take with or immediately following a meal.    [provider]  Multiple Vitamins-Minerals (CENTRUM ADULTS PO) Take 1 tablet by mouth daily.    [provider]  Omega-3 Fatty Acids (FISH OIL) 1000 MG CAPS Take 1,000 mg by mouth daily.    [provider]  Polyethyl Glycol-Propyl Glycol (SYSTANE OP) Place 1 drop into both eyes daily as needed (dry eyes).    [provider]  potassium chloride SA (KLOR-CON M) 20 MEQ tablet Take 20 mEq by mouth daily.    [provider]      Allergies    Amoxicillin, Codeine, Amlodipine, Aricept [donepezil hcl], Clonidine derivatives, Sertraline, and Clavulanic acid    Review of Systems   Review of Systems  Constitutional:  Positive for fatigue. Negative for fever.  Respiratory:  Negative for chest tightness and shortness of breath.   Cardiovascular:  Positive for leg swelling. Negative for chest pain and palpitations.  Gastrointestinal:  Negative for nausea.  Musculoskeletal:  Positive for gait problem. Negative for arthralgias, myalgias and neck pain.  All other systems reviewed and are  negative.   Physical Exam Updated Vital Signs BP (!) 154/107   Pulse (!) 107   Temp (!) 97.2 F (36.2 C) (Oral)   Resp (!) 29   SpO2 99%  Physical Exam Vitals and nursing note reviewed.  Constitutional:      General: She is not in acute distress.    Appearance: Normal appearance. She is well-developed. She is not ill-appearing, toxic-appearing or diaphoretic.  HENT:     Head: Normocephalic and atraumatic.     Right Ear: External ear normal.     Left Ear: External ear normal.     Nose: Nose normal.      Mouth/Throat:     Mouth: Mucous membranes are moist.  Eyes:     Extraocular Movements: Extraocular movements intact.     Conjunctiva/sclera: Conjunctivae normal.  Cardiovascular:     Rate and Rhythm: Normal rate and regular rhythm.     Heart sounds: No murmur heard. Pulmonary:     Effort: Pulmonary effort is normal. No respiratory distress.     Breath sounds: Normal breath sounds. No wheezing or rales.  Abdominal:     General: There is no distension.     Palpations: Abdomen is soft.     Tenderness: There is no abdominal tenderness.  Musculoskeletal:        General: No swelling or deformity.     Cervical back: Normal range of motion and neck supple.     Right lower leg: Edema present.     Left lower leg: Edema present.     Comments: Left shoulder range of motion limited by pain  Skin:    General: Skin is warm and dry.     Capillary Refill: Capillary refill takes less than 2 seconds.     Coloration: Skin is not jaundiced or pale.  Neurological:     General: No focal deficit present.     Mental Status: She is alert. Mental status is at baseline.     Cranial Nerves: No cranial nerve deficit.     Sensory: No sensory deficit.     Motor: No weakness.     Coordination: Coordination normal.  Psychiatric:        Mood and Affect: Mood normal.        Behavior: Behavior normal.        Thought Content: Thought content normal.        Judgment: Judgment normal.     ED Results / Procedures / Treatments   Labs (all labs ordered are listed, but only abnormal results are displayed) Labs Reviewed  COMPREHENSIVE METABOLIC PANEL - Abnormal; Notable for the following components:      Result Value   Glucose, Bld 112 (*)    BUN 24 (*)    Total Protein 6.2 (*)    Albumin 2.8 (*)    Alkaline Phosphatase 141 (*)    GFR, Estimated 56 (*)    All other components within normal limits  CBC WITH DIFFERENTIAL/PLATELET - Abnormal; Notable for the following components:   Platelets 447 (*)    All  other components within normal limits  TROPONIN I (HIGH SENSITIVITY) - Abnormal; Notable for the following components:   Troponin I (High Sensitivity) 23 (*)    All other components within normal limits  MAGNESIUM  URINALYSIS, ROUTINE W REFLEX MICROSCOPIC  TROPONIN I (HIGH SENSITIVITY)    EKG None  Radiology DG Shoulder Left  Result Date: 09/20/2021 CLINICAL DATA:  Left shoulder pain EXAM: LEFT SHOULDER - 2+ VIEW  COMPARISON:  August 02, 2021 FINDINGS: Diffuse osteopenia. No acute fracture or dislocation. Moderate glenohumeral and acromioclavicular joint degenerative changes. Decreased subacromial space, suggestive of chronic rotator cuff pathology. The visualized left lung is clear. IMPRESSION: 1. No acute fracture or dislocation. 2. Left glenohumeral and acromioclavicular degenerative changes, similar to prior. Electronically Signed   By: Beryle Flock M.D.   On: 09/20/2021 14:08   DG Chest Portable 1 View  Result Date: 09/20/2021 CLINICAL DATA:  Left shoulder pain, chest pain EXAM: PORTABLE CHEST 1 VIEW COMPARISON:  08/13/2021 FINDINGS: Right lower and middle lobe opacities. Central pulmonary vascular congestion. Small right pleural effusion is not excluded. No pneumothorax. Cardiomegaly. IMPRESSION: Central pulmonary vascular congestion with right lower and middle lobe opacities. Small right pleural effusion is not excluded. Mild cardiomegaly. May reflect pneumonia or edema. Electronically Signed   By: Macy Mis M.D.   On: 09/20/2021 14:05    Procedures Procedures    Medications Ordered in ED Medications  magnesium sulfate IVPB 1 g 100 mL (has no administration in time range)  furosemide (LASIX) injection 20 mg (20 mg Intravenous Given 09/20/21 1704)    ED Course/ Medical Decision Making/ A&P                           Medical Decision Making Amount and/or Complexity of Data Reviewed Labs: ordered. Radiology: ordered.  Risk Prescription drug management.   This patient  presents to the ED for concern of tachycardia, this involves an extensive number of treatment options, and is a complaint that carries with it a high risk of complications and morbidity.  The differential diagnosis includes atrial fibrillation with RVR, infection, dehydration, metabolic abnormalities, medication nonadherence, anemia   Co morbidities that complicate the patient evaluation  remote breast cancer, HTN, HLD, atrial fibrillation, GERD, arthritis   Additional history obtained:  Additional history obtained from EMS, staff at patient's nursing facility, patient's nephew External records from outside source obtained and reviewed including EMR   Lab Tests:  I Ordered, and personally interpreted labs.  The pertinent results include: Low-normal magnesium, otherwise normal electrolytes, mild elevation in troponin, normal hemoglobin, no leukocytosis   Imaging Studies ordered:  I ordered imaging studies including x-ray of chest and left shoulder I independently visualized and interpreted imaging which showed no acute findings on x-ray of shoulder.  Chest x-ray showed some right-sided opacities. I agree with the radiologist interpretation   Cardiac Monitoring: / EKG:  The patient was maintained on a cardiac monitor.  I personally viewed and interpreted the cardiac monitored which showed an underlying rhythm of: Atrial fibrillation  Problem List / ED Course / Critical interventions / Medication management  Patient presents from [facility] facility for concern of tachycardia.  EMS noted heart rate in the range of 100-120 during transit.  Patient's blood pressure remained mildly elevated.  On arrival, patient endorses left shoulder pain which she is aware is a chronic issue for her.  She denies any recent falls.  She feels that her legs are swollen and states that she has had increasing difficulty with ambulation.  She otherwise denies any current complaints, including chest  pain, palpitations, or shortness of breath.  It does seem like she is not sure why she is in the emergency department.  This prompted a call to her nursing facility and I was able to obtain corroborating information.  Patient was brought to the ED due to concern of tachycardia on onsite physician  assessment earlier today.  Patient's rhythm is atrial fibrillation.  She was placed on bedside cardiac monitor.  Heart rate was between 90 and 100 during her ED observation.  She continued to deny any acute symptoms.  She underwent laboratory workup.  Results were reassuring.  Her magnesium was optimized and she was given a dose of Lasix.  On chest x-ray, there were some right-sided opacities.  This could simply be from some edema.  CTA chest was ordered to further characterize and to assess for possible PE.  Care of patient was signed out to oncoming ED provider. I ordered medication including magnesium sulfate for electrolyte optimization; Lasix for diuresis   Social Determinants of Health:  Lives at [facility] Impression(s) / ED Diagnoses Final diagnoses:  Tachycardia    Rx / DC Orders ED Discharge Orders     None         Godfrey Pick, MD 09/20/21 1708    Godfrey Pick, MD 09/20/21 1708

## 2021-09-20 NOTE — ED Notes (Signed)
This RN attempted to reorient the patient to the situation with little success. Patient reports "this ain't no d**m emergency, yall meet to tell me "what the fu*k is going on here if it's an emergency" This RN tried to educate the patient with no successful verbal from the patient that she understands. Falls risk precautions are in place

## 2021-09-20 NOTE — ED Notes (Addendum)
Primary RN Amy was assisting in a high acuity setting  this RN at the nurses station documenting when hearing a shout for help from [place]. This RN walks into the patient's room to find that the CT tech walking in the room to take the patient to her scan has found the patient on the floor. Patient placed on bedside commode at an earlier time by nurse tech in the bay per nurse tech at bedside upon this RN walking into to find the patient resting on the floor.

## 2021-09-20 NOTE — ED Notes (Signed)
Provider notified that the patient fell. Provider at bedside to preform assessment. Patient has no obvious external injury on assessment. C collar placed per provider orders. Patient complains that she hit the back of her head on the wall and her right knee hurts. Prior to falling.. the patient had fall risk precautions in place, call bell at the bedside, and a purewick was placed on the patient while in bed.

## 2021-09-20 NOTE — ED Triage Notes (Signed)
Progressive weakness x 2 weeks with BLE edema. Hx of aflutter. Alert and oriented x 3 which is pt baseline. Lives in an [place].

## 2021-09-21 ENCOUNTER — Telehealth (HOSPITAL_COMMUNITY): Payer: Self-pay | Admitting: Pharmacy Technician

## 2021-09-21 ENCOUNTER — Other Ambulatory Visit (HOSPITAL_COMMUNITY): Payer: Medicare Other

## 2021-09-21 ENCOUNTER — Inpatient Hospital Stay (HOSPITAL_COMMUNITY): Payer: Medicare Other

## 2021-09-21 ENCOUNTER — Telehealth: Payer: Self-pay | Admitting: Pulmonary Disease

## 2021-09-21 ENCOUNTER — Other Ambulatory Visit (HOSPITAL_COMMUNITY): Payer: Self-pay

## 2021-09-21 DIAGNOSIS — I248 Other forms of acute ischemic heart disease: Secondary | ICD-10-CM

## 2021-09-21 DIAGNOSIS — K219 Gastro-esophageal reflux disease without esophagitis: Secondary | ICD-10-CM

## 2021-09-21 DIAGNOSIS — I2699 Other pulmonary embolism without acute cor pulmonale: Secondary | ICD-10-CM

## 2021-09-21 DIAGNOSIS — I480 Paroxysmal atrial fibrillation: Secondary | ICD-10-CM | POA: Diagnosis present

## 2021-09-21 LAB — URINALYSIS, COMPLETE (UACMP) WITH MICROSCOPIC
Bilirubin Urine: NEGATIVE
Glucose, UA: NEGATIVE mg/dL
Hgb urine dipstick: NEGATIVE
Ketones, ur: NEGATIVE mg/dL
Nitrite: NEGATIVE
Protein, ur: NEGATIVE mg/dL
Specific Gravity, Urine: 1.008 (ref 1.005–1.030)
pH: 5 (ref 5.0–8.0)

## 2021-09-21 LAB — TROPONIN I (HIGH SENSITIVITY)
Troponin I (High Sensitivity): 43 ng/L — ABNORMAL HIGH (ref <18)
Troponin I (High Sensitivity): 47 ng/L — ABNORMAL HIGH (ref <18)

## 2021-09-21 LAB — BASIC METABOLIC PANEL
Anion gap: 19 — ABNORMAL HIGH (ref 5–15)
BUN: 19 mg/dL (ref 8–23)
CO2: 17 mmol/L — ABNORMAL LOW (ref 22–32)
Calcium: 9.1 mg/dL (ref 8.9–10.3)
Chloride: 100 mmol/L (ref 98–111)
Creatinine, Ser: 1.05 mg/dL — ABNORMAL HIGH (ref 0.44–1.00)
GFR, Estimated: 51 mL/min — ABNORMAL LOW (ref >60)
Glucose, Bld: 140 mg/dL — ABNORMAL HIGH (ref 70–99)
Potassium: 3 mmol/L — ABNORMAL LOW (ref 3.5–5.1)
Sodium: 136 mmol/L (ref 135–145)

## 2021-09-21 LAB — HEPARIN LEVEL (UNFRACTIONATED)
Heparin Unfractionated: 0.33 IU/mL (ref 0.30–0.70)
Heparin Unfractionated: 0.52 IU/mL (ref 0.30–0.70)

## 2021-09-21 LAB — MAGNESIUM: Magnesium: 1.7 mg/dL (ref 1.7–2.4)

## 2021-09-21 LAB — CBC
HCT: 40.3 % (ref 36.0–46.0)
Hemoglobin: 13.6 g/dL (ref 12.0–15.0)
MCH: 30.5 pg (ref 26.0–34.0)
MCHC: 33.7 g/dL (ref 30.0–36.0)
MCV: 90.4 fL (ref 80.0–100.0)
Platelets: 451 10*3/uL — ABNORMAL HIGH (ref 150–400)
RBC: 4.46 MIL/uL (ref 3.87–5.11)
RDW: 15.1 % (ref 11.5–15.5)
WBC: 13.1 10*3/uL — ABNORMAL HIGH (ref 4.0–10.5)
nRBC: 0 % (ref 0.0–0.2)

## 2021-09-21 LAB — PHOSPHORUS: Phosphorus: 4.1 mg/dL (ref 2.5–4.6)

## 2021-09-21 LAB — CBG MONITORING, ED: Glucose-Capillary: 128 mg/dL — ABNORMAL HIGH (ref 70–99)

## 2021-09-21 MED ORDER — ORAL CARE MOUTH RINSE: 15.0000 mL | OROMUCOSAL | Status: DC | PRN

## 2021-09-21 MED ORDER — ACETAMINOPHEN 650 MG RE SUPP: 650.0000 mg | Freq: Four times a day (QID) | RECTAL | Status: DC | PRN

## 2021-09-21 MED ORDER — ATORVASTATIN CALCIUM 10 MG PO TABS
10.0000 mg | ORAL_TABLET | Freq: Every day | ORAL | Status: DC
  Administered 2021-09-21 – 2021-09-22 (×2): 10 mg via ORAL
  Filled 2021-09-21 (×2): qty 1

## 2021-09-21 MED ORDER — ONDANSETRON HCL 4 MG/2ML IJ SOLN: 4.0000 mg | Freq: Four times a day (QID) | INTRAMUSCULAR | Status: DC | PRN

## 2021-09-21 MED ORDER — METOPROLOL SUCCINATE ER 50 MG PO TB24
150.0000 mg | ORAL_TABLET | Freq: Every day | ORAL | Status: DC
  Administered 2021-09-21 – 2021-09-23 (×3): 150 mg via ORAL
  Filled 2021-09-21: qty 6
  Filled 2021-09-21 (×2): qty 1

## 2021-09-21 MED ORDER — DILTIAZEM HCL ER COATED BEADS 120 MG PO CP24
120.0000 mg | ORAL_CAPSULE | Freq: Every day | ORAL | Status: DC
  Administered 2021-09-21 – 2021-09-23 (×3): 120 mg via ORAL
  Filled 2021-09-21 (×3): qty 1

## 2021-09-21 MED ORDER — METOPROLOL SUCCINATE ER 25 MG PO TB24
100.0000 mg | ORAL_TABLET | Freq: Every day | ORAL | Status: DC
Start: 2021-09-21 — End: 2021-09-21

## 2021-09-21 MED ORDER — POTASSIUM CHLORIDE CRYS ER 20 MEQ PO TBCR
40.0000 meq | EXTENDED_RELEASE_TABLET | ORAL | Status: AC
  Administered 2021-09-21: 40 meq via ORAL
  Filled 2021-09-21: qty 2

## 2021-09-21 MED ORDER — ACETAMINOPHEN 325 MG PO TABS: 650.0000 mg | ORAL_TABLET | Freq: Four times a day (QID) | ORAL | Status: DC | PRN

## 2021-09-21 MED ORDER — METOPROLOL TARTRATE 5 MG/5ML IV SOLN
2.5000 mg | Freq: Four times a day (QID) | INTRAVENOUS | Status: DC | PRN
  Administered 2021-09-21: 2.5 mg via INTRAVENOUS
  Filled 2021-09-21: qty 5

## 2021-09-21 MED ORDER — ONDANSETRON HCL 4 MG PO TABS: 4.0000 mg | ORAL_TABLET | Freq: Four times a day (QID) | ORAL | Status: DC | PRN

## 2021-09-21 MED ORDER — HYDRALAZINE HCL 20 MG/ML IJ SOLN: 10.0000 mg | Freq: Four times a day (QID) | INTRAMUSCULAR | Status: DC | PRN

## 2021-09-21 NOTE — Progress Notes (Signed)
Right popliteal and calf veins positive for DVT notified by Delsa Sale from cath lab.

## 2021-09-21 NOTE — ED Notes (Signed)
This RN found patient to be in her room attempting to climb out of stretcher. Patient then removed both PIV's and threw them on the ground. When asked why patient had removed IV's and removed all cardiac leads patient stated "Get out of my room devil!! Who put me in here? I am leaving and I don't care what you say." This RN then attempted to verbally redirect patient back into bed and let her know that she was in the hospital. Patient then stated "I am in [place] and ya'll can't tell me shit don't come any closer devil." Patient then attempted to hit and kick this RN and Danae Chen RN who assisted at bedside. Patient then continued to yell " William Dalton my ass!"

## 2021-09-21 NOTE — Telephone Encounter (Signed)
Please arrange hospital f/u with Bennye Nix in 3 months for PE.

## 2021-09-21 NOTE — TOC Benefit Eligibility Note (Signed)
Patient Advocate Encounter  Insurance verification completed.    The patient is currently admitted and upon discharge could be taking Eliquis 5 mg.  The current 30 day co-pay is $47.00.   The patient is insured through [employer] Medicare Part D     Dejuana Weist, CPhT Pharmacy Patient Advocate Specialist [facility] Patient Advocate Team Direct Number: [phone]  Fax: [phone]        

## 2021-09-21 NOTE — Consult Note (Signed)
NAME:  Judy [name], MRN:  [id number], DOB:  January 16, 1931, LOS: 1 ADMISSION DATE:  09/20/2021, CONSULTATION DATE: 09/21/2021 REFERRING MD: [facility], CHIEF COMPLAINT: Shortness of breath  History of Present Illness:  88 year old female nursing home resident with known dementia, hypertension, rapid ventricular response to atrial fibrillation, poorly controlled hypertension he is not on anticoagulation due to history of falls.  He is reportedly more short of breath transferred to [facility] emergency department and found to have pulmonary embolism with strain.  Pulmonary critical care a.m. interventional radiology has been consulted.  Pertinent  Medical History   Past Medical History:  Diagnosis Date   Blood transfusion    Breast cancer, left ([place]) 09/26/2011   Punch biopsies of the left nipple done on 09/16/2011.  ER and PR were negative.  HER2 is positive with ratio 4.68.Had mastectomy on 10/25/11. Path T1,N0.    Chronic low back pain    Dyslipidemia    GERD (gastroesophageal reflux disease)    HTN (hypertension)    Osteoarthritis    Osteoporosis      Significant Hospital Events: Including procedures, antibiotic start and stop dates in addition to other pertinent events   CT scan with pulmonary embolus  Interim History / Subjective:  88 year old DNR with new diagnosis of  Objective   Blood pressure (!) 167/127, pulse (!) 117, temperature 98 F (36.7 C), resp. rate 16, height '5\' 2"'  (1.575 m), weight 64 kg, SpO2 100 %.       No intake or output data in the 24 hours ending 09/21/21 0942 Filed Weights   09/20/21 1824  Weight: 64 kg    Examination: General: Elderly female who with obvious dementia HENT: No JVD or lymphadenopathy is appreciated Lungs: Decreased in the bases Cardiovascular: Heart sounds are regular with atrial fibrillation Abdomen: Soft nontender Extremities: Mild edema Neuro: Not orientated to place or person.  Sodium progressing requiring GU:  Voids  Resolved Hospital Problem list     Assessment & Plan:  88 year old female nursing home resident is known to have dementia coronary cooperative was found to be short of breath transferred to [facility] emergency department and found to have bilateral pulmonary embolism on CT(large segmental and right main pulmonary artery.)  Pulmonary critical care asked to evaluate.  Note she has a past medical history of atrial fibrillation response is not on anticoagulation due to multiple falls.  She does note 1 week of lower extremity discomfort Systemic heparin Questionable evaluation by interventional radiology Admit to progressive unit and to [facility] hospitalist service  Poorly controlled hypertension Per primary  Atrial fibrillation with rapid ventricular response Currently receiving metoprolol.  Dementia Per primary  Best Practice (right click and "Reselect all SmartList Selections" daily)   Diet/type: Regular consistency (see orders) DVT prophylaxis: systemic heparin GI prophylaxis: PPI Lines: N/A Foley:  N/A Code Status:  full code Last date of multidisciplinary goals of care discussion [tbd]  Labs   CBC: Recent Labs  Lab 09/20/21 1419  WBC 8.2  NEUTROABS 6.3  HGB 13.6  HCT 40.5  MCV 91.0  PLT 447*    Basic Metabolic Panel: Recent Labs  Lab 09/20/21 1419 09/21/21 0603  NA 135 136  K 4.5 3.0*  CL 101 100  CO2 25 17*  GLUCOSE 112* 140*  BUN 24* 19  CREATININE 0.97 1.05*  CALCIUM 9.3 9.1  MG 1.7 1.7  PHOS  --  4.1   GFR: Estimated Creatinine Clearance: 31.9 mL/min (A) (by C-G formula based on  SCr of 1.05 mg/dL (H)). Recent Labs  Lab 09/20/21 1419  WBC 8.2    Liver Function Tests: Recent Labs  Lab 09/20/21 1419  AST 24  ALT 21  ALKPHOS 141*  BILITOT 0.9  PROT 6.2*  ALBUMIN 2.8*   No results for input(s): "LIPASE", "AMYLASE" in the last 168 hours. No results for input(s): "AMMONIA" in the last 168 hours.  ABG No results found for:  "PHART", "PCO2ART", "PO2ART", "HCO3", "TCO2", "ACIDBASEDEF", "O2SAT"   Coagulation Profile: Recent Labs  Lab 09/20/21 2256  INR 1.1    Cardiac Enzymes: No results for input(s): "CKTOTAL", "CKMB", "CKMBINDEX", "TROPONINI" in the last 168 hours.  HbA1C: No results found for: "HGBA1C"  CBG: Recent Labs  Lab 09/21/21 0845  GLUCAP 128*    Review of Systems:   na  Past Medical History:  She,  has a past medical history of Blood transfusion, Breast cancer, left ([place]) (09/26/2011), Chronic low back pain, Dyslipidemia, GERD (gastroesophageal reflux disease), HTN (hypertension), Osteoarthritis, and Osteoporosis.   Surgical History:   Past Surgical History:  Procedure Laterality Date   BACK SURGERY  2009   lumb lam   BREAST SURGERY  2009   lt lumpectomy-plus 2 re-excisions for margins   left breast mastectomy  2013   MASTECTOMY Left    ROTATOR CUFF REPAIR     Bilateral, 2010   TOTAL ABDOMINAL HYSTERECTOMY       Social History:   reports that she has never smoked. She has never used smokeless tobacco. She reports that she does not drink alcohol and does not use drugs.   Family History:  Her family history includes Breast cancer in her sister; Cancer in her sister and sister; Colon cancer in her brother; Diabetes in her son. There is no history of Esophageal cancer, Rectal cancer, or Stomach cancer.   Allergies Allergies  Allergen Reactions   Amoxicillin Nausea Only   Codeine Nausea Only   Amlodipine Swelling   Aricept [Donepezil Hcl] Other (See Comments)    Felt drunk and headache   Clonidine Derivatives Other (See Comments)    Mouth dryness   Sertraline Nausea Only   Clavulanic Acid Hives and Rash     Home Medications  Prior to Admission medications   Medication Sig Start Date End Date Taking? Authorizing Provider  atorvastatin (LIPITOR) 10 MG tablet Take 1 tablet (10 mg total) by mouth daily at 6 PM. 08/03/21   Little Ishikawa, MD  cephALEXin (KEFLEX) 500  MG capsule Take 1 capsule (500 mg total) by mouth 3 (three) times daily. 08/13/21   Rancour, Annie Main, MD  diltiazem (CARDIZEM CD) 120 MG 24 hr capsule Take 1 capsule (120 mg total) by mouth daily. 08/03/21 08/03/22  Little Ishikawa, MD  lisinopril (PRINIVIL,ZESTRIL) 40 MG tablet Take 40 mg by mouth daily. Patient not taking: Reported on 08/12/2021    [provider]  metoprolol succinate (TOPROL-XL) 100 MG 24 hr tablet Take 100 mg by mouth daily. Take with or immediately following a meal.    [provider]  Multiple Vitamins-Minerals (CENTRUM ADULTS PO) Take 1 tablet by mouth daily.    [provider]  Omega-3 Fatty Acids (FISH OIL) 1000 MG CAPS Take 1,000 mg by mouth daily.    [provider]  Polyethyl Glycol-Propyl Glycol (SYSTANE OP) Place 1 drop into both eyes daily as needed (dry eyes).    [provider]  potassium chloride SA (KLOR-CON M) 20 MEQ tablet Take 20 mEq by mouth daily.  [provider]     Critical care time:     Gaylyn Lambert ACNP Acute Care Nurse Practitioner [place] Please consult Amion 09/21/2021, 9:42 AM

## 2021-09-21 NOTE — Telephone Encounter (Signed)
Pharmacy Patient Advocate Encounter  Insurance verification completed.    The patient is insured through [employer] Medicare Part D   The patient is currently admitted and ran test claims for the following: Eliquis.  Copays and coinsurance results were relayed to Inpatient clinical team.  

## 2021-09-21 NOTE — Progress Notes (Signed)
ANTICOAGULATION CONSULT NOTE - Initial Consult  Pharmacy Consult for Heparin Indication: pulmonary embolus  Allergies  Allergen Reactions   Amoxil [Amoxicillin] Nausea Only   Codeine Nausea Only   Aricept [Donepezil Hcl] Other (See Comments)    "Felt drunk" Headaches Drowsiness   Catapres [Clonidine] Other (See Comments)    Dry mouth   Hycodan [Hydrocodone Bit-Homatrop Mbr] Diarrhea and Nausea And Vomiting   Norco [Hydrocodone-Acetaminophen] Diarrhea and Nausea And Vomiting   Norvasc [Amlodipine] Swelling   Prolia [Denosumab] Other (See Comments)    Unknown reaction   Ultram [Tramadol] Nausea Only   Zoloft [Sertraline] Nausea Only   Clavulanic Acid Hives and Rash    Patient Measurements: Height: '5\' 2"'  (157.5 cm) Weight: 64 kg (141 lb 1.5 oz) IBW/kg (Calculated) : 50.1 Heparin Dosing Weight: 63 kg  Vital Signs: Temp: 97.7 F (36.5 C) (08/08 1427) Temp Source: Oral (08/08 1427) BP: 158/122 (08/08 1427) Pulse Rate: 114 (08/08 1427)  Labs: Recent Labs    09/20/21 1419 09/20/21 1616 09/20/21 2256 09/21/21 0603 09/21/21 0817 09/21/21 0925 09/21/21 1515  HGB 13.6  --   --   --   --  13.6  --   HCT 40.5  --   --   --   --  40.3  --   PLT 447*  --   --   --   --  451*  --   APTT  --   --  29  --   --   --   --   LABPROT  --   --  14.1  --   --   --   --   INR  --   --  1.1  --   --   --   --   HEPARINUNFRC  --   --   --  0.33  --   --  0.52  CREATININE 0.97  --   --  1.05*  --   --   --   TROPONINIHS 23* 21*  --  43* 47*  --   --      Estimated Creatinine Clearance: 31.9 mL/min (A) (by C-G formula based on SCr of 1.05 mg/dL (H)).   Medical History: Past Medical History:  Diagnosis Date   Blood transfusion    Breast cancer, left ([place]) 09/26/2011   Punch biopsies of the left nipple done on 09/16/2011.  ER and PR were negative.  HER2 is positive with ratio 4.68.Had mastectomy on 10/25/11. Path T1,N0.    Chronic low back pain    Dyslipidemia    GERD  (gastroesophageal reflux disease)    HTN (hypertension)    Osteoarthritis    Osteoporosis     Medications:  Medications Prior to Admission  Medication Sig Dispense Refill Last Dose   acetaminophen (TYLENOL) 500 MG tablet Take 500-1,000 mg by mouth See admin instructions. 1000 mg every 12 hours + 500 mg every 6 hours PRN for fever 99.5-101, minor headache, minor discomfort.   09/20/2021   atorvastatin (LIPITOR) 10 MG tablet Take 1 tablet (10 mg total) by mouth daily at 6 PM. (Patient taking differently: Take 10 mg by mouth at bedtime.) 30 tablet 1 09/20/2021   diltiazem (CARDIZEM CD) 120 MG 24 hr capsule Take 1 capsule (120 mg total) by mouth daily. 30 capsule 1 09/20/2021   hydrochlorothiazide (HYDRODIURIL) 25 MG tablet Take 25 mg by mouth daily.   09/20/2021   ibuprofen (ADVIL) 200 MG tablet Take 400 mg by mouth every 6 (  six) hours as needed (pain).   Past Week   lisinopril (PRINIVIL,ZESTRIL) 40 MG tablet Take 40 mg by mouth daily.   09/20/2021   metoprolol succinate (TOPROL-XL) 100 MG 24 hr tablet Take 100 mg by mouth daily.   09/19/2021 at 20:36   Omega-3 Fatty Acids (OMEGA-3 PO) Take 1 capsule by mouth daily. Omega 3 - dha - epa - fish oil [phone] mg capsule   09/20/2021   potassium chloride SA (KLOR-CON M) 20 MEQ tablet Take 20 mEq by mouth daily.   09/20/2021   saccharomyces boulardii (FLORASTOR) 250 MG capsule Take 250 mg by mouth 2 (two) times daily.   09/20/2021   cephALEXin (KEFLEX) 500 MG capsule Take 1 capsule (500 mg total) by mouth 3 (three) times daily. (Patient not taking: Reported on 09/21/2021) 21 capsule 0 Not Taking    Scheduled:   atorvastatin  10 mg Oral q1800   diltiazem  120 mg Oral Daily   metoprolol succinate  150 mg Oral Daily   Infusions:   heparin 1,100 Units/hr (09/21/21 0650)   PRN:   Assessment: 53 yof with a history of remote breast cancer, HTN, HLD, atrial fibrillation, GERD, arthritis. Patient is presenting with weakness. Heparin per pharmacy consult placed for  pulmonary embolus.  CTA PE w/ bilateral PE per ED MD note.  Patient is not on anticoagulation prior to arrival.  Confirm heparin level came back therapeutic tonight. Plan to transition to apixaban soon after ECHO.  Goal of Therapy:  Heparin level 0.3-0.7 units/ml Monitor platelets by anticoagulation protocol: Yes   Plan:  Cont heparin infusion at 1100 units/hr Check anti-Xa level daily while on heparin Continue to monitor H&H and platelets  Onnie Boer, PharmD, BCIDP, AAHIVP, CPP Infectious Disease Pharmacist 09/21/2021 4:15 PM

## 2021-09-21 NOTE — Progress Notes (Signed)
Bilateral LE venous duplex study completed. Notified RN and MD Marcello Moores of preliminary results.  Please see CV Proc for preliminary results.  Zykia Walla BS, RVT 09/21/2021 2:31 PM

## 2021-09-21 NOTE — Progress Notes (Signed)
Ms. Boberg is admitted to 5w10. Alert and verbal. Family at bedside. Skin assessed with Marshall Cork. Connected to cardiac monitoring, oriented to room, call light, and bed controls. Sitter at bedside. Bed in lowest position, call light within reach.

## 2021-09-21 NOTE — Progress Notes (Addendum)
PROGRESS NOTE    Judy [name]  RXV:[id number] DOB: [birth year] DOA: 09/20/2021 PCP: Mayra Neer, MD   Brief Narrative:  HPI: Judy [name] is a 88 y.o. female with medical history significant of HTN, Afib d/c 08/02/21 on not on AC due to fall risk, GERD, HLD, Dementia , remote BRCA who present from facility with noted increase weakness, elevated blood pressure as well as afib with rvr. Patient is a very poor historian.She however denies any sob, chest pain ,n/v/d /dysuria or abdominal pain.      ED Course:    Patient on evaluation was noted to be pleasantly demented, work significant for  CT with B?L PE. ED course complicated by fall, CTH s/p fall noted to be negative  Patient was started on heparin and discussed with pulmonary who rec f/u in AM. Patient also noted to have spike in hr to 160-180, given home regimen  with improvement in rate to 120's . Patient remained asymptomatic    97.2, BP 154/106, hr 101-160 , rr 16 sat 99% Lab: Wbc 8.2, hgb 13.6, plt 447,  Na:135, K 4.5 ,  alkphos 141 Mag 1.7 Ce23,21 UA neg CT head: NAD Tx lasix 20, mag 1g  Assessment & Plan:   Principal Problem:   Pulmonary emboli (HCC) Active Problems:   Hypokalemia   HTN (hypertension)   Dyslipidemia   Paroxysmal atrial fibrillation with RVR (HCC)   GERD (gastroesophageal reflux disease)  Large central and subsegmental bilateral PE: CT chest has evidence of right heart strain.  Patient was started on heparin drip.  Evaluated by PCCM, they did not recommend any thrombolytics.  IR was aware as well.  Patient much comfortable.  Multiple family members at the bedside.  Patient is not having hypoxic, denied any chest pain or shortness of breath.  Echo pending.  Continue heparin and eventually will transition to Eliquis based on the echo results.  Will order Doppler lower extremities.  Elevated troponin: Likely secondary to demand ischemia.  Echo pending.  History of paroxysmal atrial fibrillation, now  with RVR: Takes Cardizem and Toprol-XL at home.  Heart rate around 108 but she is totally comfortable.  Increased her Toprol-XL to 150 mg p.o. daily but continue current dose of diltiazem long-acting.  Essential hypertension: Blood pressure slightly elevated.  Takes hydrochlorothiazide and lisinopril at home which are on hold and will continue to hold due to slightly elevated creatinine.  Continue Toprol-XL and diltiazem.  We will add hydralazine as needed.  Hypokalemia: Replace.  GERD: PPI.  Hyperlipidemia: Statin.  Moderate dementia: Has sitter at bedside.  Patient was having behavioral changes overnight but during my evaluation, she was very pleasant.  She was alert and oriented x2 as well.  3 family members were at the bedside.  DVT prophylaxis:   Heparin   Code Status: DNR  Family Communication: Sister, daughter-in-law and nephew present at bedside.  Status is: Inpatient Remains inpatient appropriate because: Needs heparin drip and echo   Estimated body mass index is 25.81 kg/m as calculated from the following:   Height as of this encounter: '5\' 2"'  (1.575 m).   Weight as of this encounter: 64 kg.    Nutritional Assessment: Body mass index is 25.81 kg/m.Marland Kitchen Seen by dietician.  I agree with the assessment and plan as outlined below: Nutrition Status:        . Skin Assessment: I have examined the patient's skin and I agree with the wound assessment as performed by the wound care RN as  outlined below:    Consultants:  PCCM and IR  Procedures:  None  Antimicrobials:  Anti-infectives (From admission, onward)    None         Subjective: Patient seen and examined.  She has no complaints.  She is pleasantly confused but actually doing much better, now oriented x2.  Objective: Vitals:   09/21/21 1100 09/21/21 1148 09/21/21 1156 09/21/21 1200  BP:  (!) 169/123  (!) 161/108  Pulse: (!) 114 (!) 108  86  Resp:  16    Temp:   98.6 F (37 C)   TempSrc:   Oral    SpO2: 97% 100%  97%  Weight:      Height:       No intake or output data in the 24 hours ending 09/21/21 1232 Filed Weights   09/20/21 1824  Weight: 64 kg    Examination:  General exam: Appears calm and comfortable  Respiratory system: Clear to auscultation. Respiratory effort normal. Cardiovascular system: S1 & S2 heard, RRR. No JVD, murmurs, rubs, gallops or clicks. No pedal edema. Gastrointestinal system: Abdomen is nondistended, soft and nontender. No organomegaly or masses felt. Normal bowel sounds heard. Central nervous system: Alert and oriented x2. No focal neurological deficits. Extremities: Symmetric 5 x 5 power. Skin: No rashes, lesions or ulcers Psychiatry: Judgement and insight appear poor.   Data Reviewed: I have personally reviewed following labs and imaging studies  CBC: Recent Labs  Lab 09/20/21 1419 09/21/21 0925  WBC 8.2 13.1*  NEUTROABS 6.3  --   HGB 13.6 13.6  HCT 40.5 40.3  MCV 91.0 90.4  PLT 447* 865*   Basic Metabolic Panel: Recent Labs  Lab 09/20/21 1419 09/21/21 0603  NA 135 136  K 4.5 3.0*  CL 101 100  CO2 25 17*  GLUCOSE 112* 140*  BUN 24* 19  CREATININE 0.97 1.05*  CALCIUM 9.3 9.1  MG 1.7 1.7  PHOS  --  4.1   GFR: Estimated Creatinine Clearance: 31.9 mL/min (A) (by C-G formula based on SCr of 1.05 mg/dL (H)). Liver Function Tests: Recent Labs  Lab 09/20/21 1419  AST 24  ALT 21  ALKPHOS 141*  BILITOT 0.9  PROT 6.2*  ALBUMIN 2.8*   No results for input(s): "LIPASE", "AMYLASE" in the last 168 hours. No results for input(s): "AMMONIA" in the last 168 hours. Coagulation Profile: Recent Labs  Lab 09/20/21 2256  INR 1.1   Cardiac Enzymes: No results for input(s): "CKTOTAL", "CKMB", "CKMBINDEX", "TROPONINI" in the last 168 hours. BNP (last 3 results) No results for input(s): "PROBNP" in the last 8760 hours. HbA1C: No results for input(s): "HGBA1C" in the last 72 hours. CBG: Recent Labs  Lab 09/21/21 0845  GLUCAP  128*   Lipid Profile: No results for input(s): "CHOL", "HDL", "LDLCALC", "TRIG", "CHOLHDL", "LDLDIRECT" in the last 72 hours. Thyroid Function Tests: No results for input(s): "TSH", "T4TOTAL", "FREET4", "T3FREE", "THYROIDAB" in the last 72 hours. Anemia Panel: No results for input(s): "VITAMINB12", "FOLATE", "FERRITIN", "TIBC", "IRON", "RETICCTPCT" in the last 72 hours. Sepsis Labs: No results for input(s): "PROCALCITON", "LATICACIDVEN" in the last 168 hours.  No results found for this or any previous visit (from the past 240 hour(s)).   Radiology Studies: DG Tibia/Fibula Right  Result Date: 09/21/2021 CLINICAL DATA:  88 year old female with right lower extremity pain after fall. EXAM: RIGHT TIBIA AND FIBULA - 2 VIEW COMPARISON:  None Available. FINDINGS: There is no evidence of fracture or other focal bone lesions. Soft tissues are  unremarkable. IMPRESSION: No acute fracture or malalignment. Electronically Signed   By: Ruthann Cancer M.D.   On: 09/21/2021 11:22   CT Angio Chest PE W and/or Wo Contrast  Result Date: 09/20/2021 CLINICAL DATA:  Progressive weakness for 2 weeks, lower extremity edema EXAM: CT ANGIOGRAPHY CHEST WITH CONTRAST TECHNIQUE: Multidetector CT imaging of the chest was performed using the standard protocol during bolus administration of intravenous contrast. Multiplanar CT image reconstructions and MIPs were obtained to evaluate the vascular anatomy. RADIATION DOSE REDUCTION: This exam was performed according to the departmental dose-optimization program which includes automated exposure control, adjustment of the mA and/or kV according to patient size and/or use of iterative reconstruction technique. CONTRAST:  167m OMNIPAQUE IOHEXOL 350 MG/ML SOLN COMPARISON:  09/20/2021 FINDINGS: Cardiovascular: This is a technically adequate evaluation of the pulmonary vasculature. There is a large pulmonary embolus within the right main pulmonary artery, with complete occlusion of the  right middle and right lower lobe pulmonary arteries as result. Central embolus is seen within the left main pulmonary artery, extending into the segmental branches. There is a large clot burden, with evidence of right ventricular strain with RV/LV ratio measuring 1.48. There is significant right atrial dilation with reflux of contrast into the hepatic veins consistent with cardiac dysfunction. No pericardial effusion. Normal caliber of the thoracic aorta. Atherosclerosis of the aorta and coronary vasculature. Mediastinum/Nodes: No enlarged mediastinal, hilar, or axillary lymph nodes. Thyroid gland, trachea, and esophagus demonstrate no significant findings. Lungs/Pleura: Dense right middle and right lower lobe consolidation likely reflect pulmonary infarct given large central pulmonary emboli. Moderate right pleural effusion measures less than 1 L. No pneumothorax. Upper Abdomen: Calcified gallstones. No other acute upper abdominal findings. Musculoskeletal: No acute or destructive bony lesions. Reconstructed images demonstrate no additional findings. Review of the MIP images confirms the above findings. IMPRESSION: 1. Large central and segmental bilateral pulmonary emboli, most pronounced within the right main pulmonary artery as above. CT evidence of right heart strain (RV/LV Ratio = 1.48) consistent with at least submassive (intermediate risk) PE. The presence of right heart strain has been associated with an increased risk of morbidity and mortality. Please refer to the "Code PE Focused" order set in EPIC. 2. Dense right middle and right lower lobe consolidation most consistent with pulmonary infarct given central pulmonary emboli. 3. Moderate right pleural effusion measures less than 1 L. 4.  Aortic Atherosclerosis (ICD10-I70.0). 5. Cholelithiasis. Critical Value/emergent results were called by telephone at the time of interpretation on 09/20/2021 at 10:17 pm to [facility] who verbally acknowledged  these results. Electronically Signed   By: MRanda NgoM.D.   On: 09/20/2021 22:19   CT HEAD WO CONTRAST (5MM)  Result Date: 09/20/2021 CLINICAL DATA:  Head trauma, minor (Age >= 65y); Neck trauma (Age >= 65y) EXAM: CT HEAD WITHOUT CONTRAST CT CERVICAL SPINE WITHOUT CONTRAST TECHNIQUE: Multidetector CT imaging of the head and cervical spine was performed following the standard protocol without intravenous contrast. Multiplanar CT image reconstructions of the cervical spine were also generated. RADIATION DOSE REDUCTION: This exam was performed according to the departmental dose-optimization program which includes automated exposure control, adjustment of the mA and/or kV according to patient size and/or use of iterative reconstruction technique. COMPARISON:  None Available. FINDINGS: CT HEAD FINDINGS BRAIN: BRAIN Cerebral ventricle sizes are concordant with the degree of cerebral volume loss. Patchy and confluent areas of decreased attenuation are noted throughout the deep and periventricular white matter of the cerebral hemispheres bilaterally, compatible with chronic  microvascular ischemic disease. No evidence of large-territorial acute infarction. No parenchymal hemorrhage. No mass lesion. No extra-axial collection. No mass effect or midline shift. No hydrocephalus. Basilar cisterns are patent. Vascular: No hyperdense vessel. Skull: No acute fracture or focal lesion. Sinuses/Orbits: Paranasal sinuses and mastoid air cells are clear. Bilateral lens replacement. Otherwise The orbits are unremarkable. Other: None. CT CERVICAL SPINE FINDINGS Alignment: Normal. Skull base and vertebrae: Multilevel severe degenerative changes of the spine. No severe osseous neural foraminal or central canal stenosis. No acute fracture. No aggressive appearing focal osseous lesion or focal pathologic process. Soft tissues and spinal canal: No prevertebral fluid or swelling. No visible canal hematoma. Upper chest: Unremarkable.  Other: Right pleural effusion. Please see separately dictated CT angiography 10/10/2021 chest for positive findings. Atherosclerotic plaque of the carotid arteries within the neck. IMPRESSION: 1. No acute intracranial abnormality. 2. No acute displaced fracture or traumatic listhesis of the cervical spine. 3. Right pleural effusion. Please see separately dictated CT angiography 10/10/2021 chest for positive findings. Electronically Signed   By: Iven Finn M.D.   On: 09/20/2021 22:13   CT Cervical Spine Wo Contrast  Result Date: 09/20/2021 CLINICAL DATA:  Head trauma, minor (Age >= 65y); Neck trauma (Age >= 65y) EXAM: CT HEAD WITHOUT CONTRAST CT CERVICAL SPINE WITHOUT CONTRAST TECHNIQUE: Multidetector CT imaging of the head and cervical spine was performed following the standard protocol without intravenous contrast. Multiplanar CT image reconstructions of the cervical spine were also generated. RADIATION DOSE REDUCTION: This exam was performed according to the departmental dose-optimization program which includes automated exposure control, adjustment of the mA and/or kV according to patient size and/or use of iterative reconstruction technique. COMPARISON:  None Available. FINDINGS: CT HEAD FINDINGS BRAIN: BRAIN Cerebral ventricle sizes are concordant with the degree of cerebral volume loss. Patchy and confluent areas of decreased attenuation are noted throughout the deep and periventricular white matter of the cerebral hemispheres bilaterally, compatible with chronic microvascular ischemic disease. No evidence of large-territorial acute infarction. No parenchymal hemorrhage. No mass lesion. No extra-axial collection. No mass effect or midline shift. No hydrocephalus. Basilar cisterns are patent. Vascular: No hyperdense vessel. Skull: No acute fracture or focal lesion. Sinuses/Orbits: Paranasal sinuses and mastoid air cells are clear. Bilateral lens replacement. Otherwise The orbits are unremarkable. Other:  None. CT CERVICAL SPINE FINDINGS Alignment: Normal. Skull base and vertebrae: Multilevel severe degenerative changes of the spine. No severe osseous neural foraminal or central canal stenosis. No acute fracture. No aggressive appearing focal osseous lesion or focal pathologic process. Soft tissues and spinal canal: No prevertebral fluid or swelling. No visible canal hematoma. Upper chest: Unremarkable. Other: Right pleural effusion. Please see separately dictated CT angiography 10/10/2021 chest for positive findings. Atherosclerotic plaque of the carotid arteries within the neck. IMPRESSION: 1. No acute intracranial abnormality. 2. No acute displaced fracture or traumatic listhesis of the cervical spine. 3. Right pleural effusion. Please see separately dictated CT angiography 10/10/2021 chest for positive findings. Electronically Signed   By: Iven Finn M.D.   On: 09/20/2021 22:13   DG Shoulder Left  Result Date: 09/20/2021 CLINICAL DATA:  Left shoulder pain EXAM: LEFT SHOULDER - 2+ VIEW COMPARISON:  August 02, 2021 FINDINGS: Diffuse osteopenia. No acute fracture or dislocation. Moderate glenohumeral and acromioclavicular joint degenerative changes. Decreased subacromial space, suggestive of chronic rotator cuff pathology. The visualized left lung is clear. IMPRESSION: 1. No acute fracture or dislocation. 2. Left glenohumeral and acromioclavicular degenerative changes, similar to prior. Electronically Signed   By: Robby Sermon  Konanur M.D.   On: 09/20/2021 14:08   DG Chest Portable 1 View  Result Date: 09/20/2021 CLINICAL DATA:  Left shoulder pain, chest pain EXAM: PORTABLE CHEST 1 VIEW COMPARISON:  08/13/2021 FINDINGS: Right lower and middle lobe opacities. Central pulmonary vascular congestion. Small right pleural effusion is not excluded. No pneumothorax. Cardiomegaly. IMPRESSION: Central pulmonary vascular congestion with right lower and middle lobe opacities. Small right pleural effusion is not excluded.  Mild cardiomegaly. May reflect pneumonia or edema. Electronically Signed   By: Macy Mis M.D.   On: 09/20/2021 14:05    Scheduled Meds:  atorvastatin  10 mg Oral q1800   diltiazem  120 mg Oral Daily   metoprolol succinate  150 mg Oral Daily   potassium chloride  40 mEq Oral Q4H   Continuous Infusions:  heparin 1,100 Units/hr (09/21/21 0650)     LOS: 1 day   Darliss Cheney, MD [facility]  09/21/2021, 12:32 PM   *Please note that this is a verbal dictation therefore any spelling or grammatical errors are due to the "[place] [employer]" system interpretation.  Please page via [place] and do not message via secure chat for urgent patient care matters. Secure chat can be used for non urgent patient care matters.  How to contact the [facility] Attending or Consulting provider [place] or covering provider during after hours [place], for this patient?  Check the care team in [facility] and look for a) attending/consulting [facility] provider listed and b) the [facility] team listed. Page or secure chat [phone]. Log into [email or web address] and use [facility]'s universal password to access. If you do not have the password, please contact the hospital operator. Locate the [facility] provider you are looking for under [facility] and page to a number that you can be directly reached. If you still have difficulty reaching the provider, please page the [facility] (Director on Call) for the Hospitalists listed on amion for assistance.

## 2021-09-21 NOTE — ED Notes (Signed)
Report given and care endorsed to Donia Pounds RN

## 2021-09-21 NOTE — Progress Notes (Signed)
ANTICOAGULATION CONSULT NOTE - Follow Up Consult  Pharmacy Consult for heparin Indication: pulmonary embolus  Labs: Recent Labs    09/20/21 1419 09/20/21 1616 09/20/21 2256 09/21/21 0603  HGB 13.6  --   --   --   HCT 40.5  --   --   --   PLT 447*  --   --   --   APTT  --   --  29  --   LABPROT  --   --  14.1  --   INR  --   --  1.1  --   HEPARINUNFRC  --   --   --  0.33  CREATININE 0.97  --   --   --   TROPONINIHS 23* 21*  --   --     Assessment: 88yo female therapeutic on heparin with initial dosing for PE but at very low end of goal; no infusion issues (though IV access was lost briefly around MN) or signs of bleeding per RN.  Goal of Therapy:  Heparin level 0.3-0.7 units/ml   Plan:  Will increase heparin infusion by 10% to 1100 units/hr and check level in 8 hours.    Wynona Neat, PharmD, BCPS  09/21/2021,6:43 AM

## 2021-09-22 ENCOUNTER — Other Ambulatory Visit (HOSPITAL_COMMUNITY): Payer: Self-pay

## 2021-09-22 ENCOUNTER — Inpatient Hospital Stay (HOSPITAL_COMMUNITY): Payer: Medicare Other

## 2021-09-22 DIAGNOSIS — E785 Hyperlipidemia, unspecified: Secondary | ICD-10-CM | POA: Diagnosis not present

## 2021-09-22 DIAGNOSIS — I480 Paroxysmal atrial fibrillation: Secondary | ICD-10-CM

## 2021-09-22 DIAGNOSIS — I2699 Other pulmonary embolism without acute cor pulmonale: Secondary | ICD-10-CM | POA: Diagnosis not present

## 2021-09-22 DIAGNOSIS — K219 Gastro-esophageal reflux disease without esophagitis: Secondary | ICD-10-CM

## 2021-09-22 DIAGNOSIS — I159 Secondary hypertension, unspecified: Secondary | ICD-10-CM

## 2021-09-22 DIAGNOSIS — I248 Other forms of acute ischemic heart disease: Secondary | ICD-10-CM | POA: Diagnosis not present

## 2021-09-22 LAB — ECHOCARDIOGRAM COMPLETE
Height: 62 in
Weight: 2257.51 oz

## 2021-09-22 LAB — CBC
HCT: 44.6 % (ref 36.0–46.0)
Hemoglobin: 15.3 g/dL — ABNORMAL HIGH (ref 12.0–15.0)
MCH: 31 pg (ref 26.0–34.0)
MCHC: 34.3 g/dL (ref 30.0–36.0)
MCV: 90.3 fL (ref 80.0–100.0)
Platelets: 394 10*3/uL (ref 150–400)
RBC: 4.94 MIL/uL (ref 3.87–5.11)
RDW: 15.6 % — ABNORMAL HIGH (ref 11.5–15.5)
WBC: 13.5 10*3/uL — ABNORMAL HIGH (ref 4.0–10.5)
nRBC: 0 % (ref 0.0–0.2)

## 2021-09-22 LAB — HEPARIN LEVEL (UNFRACTIONATED): Heparin Unfractionated: 0.69 IU/mL (ref 0.30–0.70)

## 2021-09-22 LAB — BASIC METABOLIC PANEL
Anion gap: 20 — ABNORMAL HIGH (ref 5–15)
BUN: 21 mg/dL (ref 8–23)
CO2: 19 mmol/L — ABNORMAL LOW (ref 22–32)
Calcium: 9.3 mg/dL (ref 8.9–10.3)
Chloride: 99 mmol/L (ref 98–111)
Creatinine, Ser: 1.09 mg/dL — ABNORMAL HIGH (ref 0.44–1.00)
GFR, Estimated: 49 mL/min — ABNORMAL LOW (ref >60)
Glucose, Bld: 105 mg/dL — ABNORMAL HIGH (ref 70–99)
Potassium: 3.4 mmol/L — ABNORMAL LOW (ref 3.5–5.1)
Sodium: 138 mmol/L (ref 135–145)

## 2021-09-22 LAB — MAGNESIUM: Magnesium: 1.8 mg/dL (ref 1.7–2.4)

## 2021-09-22 MED ORDER — GUAIFENESIN 100 MG/5ML PO LIQD: 5.0000 mL | ORAL | Status: DC | PRN

## 2021-09-22 MED ORDER — POTASSIUM CHLORIDE 20 MEQ PO PACK
40.0000 meq | PACK | Freq: Once | ORAL | Status: DC
  Filled 2021-09-22: qty 2

## 2021-09-22 MED ORDER — METOPROLOL TARTRATE 5 MG/5ML IV SOLN: 5.0000 mg | INTRAVENOUS | Status: DC | PRN

## 2021-09-22 MED ORDER — APIXABAN 5 MG PO TABS: 5.0000 mg | ORAL_TABLET | Freq: Two times a day (BID) | ORAL | Status: DC

## 2021-09-22 MED ORDER — HYDRALAZINE HCL 20 MG/ML IJ SOLN: 10.0000 mg | INTRAMUSCULAR | Status: DC | PRN

## 2021-09-22 MED ORDER — IPRATROPIUM-ALBUTEROL 0.5-2.5 (3) MG/3ML IN SOLN: 3.0000 mL | RESPIRATORY_TRACT | Status: DC | PRN

## 2021-09-22 MED ORDER — SENNOSIDES-DOCUSATE SODIUM 8.6-50 MG PO TABS: 1.0000 | ORAL_TABLET | Freq: Every evening | ORAL | Status: DC | PRN

## 2021-09-22 MED ORDER — TRAZODONE HCL 50 MG PO TABS: 50.0000 mg | ORAL_TABLET | Freq: Every evening | ORAL | Status: DC | PRN

## 2021-09-22 MED ORDER — APIXABAN 5 MG PO TABS
10.0000 mg | ORAL_TABLET | Freq: Two times a day (BID) | ORAL | Status: DC
  Administered 2021-09-22 – 2021-09-23 (×3): 10 mg via ORAL
  Filled 2021-09-22 (×3): qty 2

## 2021-09-22 NOTE — TOC Initial Note (Signed)
Transition of Care [facility]) - Initial/Assessment Note    Patient Details  Name: Judy [name] MRN: [id number] Date of Birth: 01-Aug-1930  Transition of Care [facility]) CM/SW Contact:    Benard Halsted, LCSW Phone Number: 09/22/2021, 2:35 PM  Clinical Narrative:                 CSW spoke with Roselyn Reef at [facility] regarding discharge. She requested patient receive PT evaluation to determine if she can walk. PT ordered.  She requested CSW send over Fl2, med list, vitals, and notes to f. [phone]. She will need printed scripts for any new medications.   Expected Discharge Plan: Assisted Living Barriers to Discharge: Other (must enter comment) (ALF requesting PT eval; restraints)   Patient Goals and CMS Choice Patient states their goals for this hospitalization and ongoing recovery are:: Return to ALF      Expected Discharge Plan and Services Expected Discharge Plan: Assisted Living In-house Referral: Clinical Social Work   Post Acute Care Choice: Resumption of Svcs/PTA Provider Living arrangements for the past 2 months: [place]                                      Prior Living Arrangements/Services Living arrangements for the past 2 months: [place] Lives with:: Facility Resident Patient language and need for interpreter reviewed:: Yes Do you feel safe going back to the place where you live?: Yes      Need for Family Participation in Patient Care: Yes (Comment) Care giver support system in place?: Yes (comment) Current home services: DME Criminal Activity/Legal Involvement Pertinent to Current Situation/Hospitalization: No - Comment as needed  Activities of Daily Living      Permission Sought/Granted Permission sought to share information with : Facility Sport and exercise psychologist, Family Supports Permission granted to share information with : No  Share Information with NAME: Nori Riis  Permission granted to share info w AGENCY: [place] granted to share info w Relationship: [employer] granted to share info w Contact Information: [phone]  Emotional Assessment Appearance:: Appears stated age Attitude/Demeanor/Rapport: Unable to Assess Affect (typically observed): Unable to Assess Orientation: : Oriented to Self Alcohol / Substance Use: Not Applicable Psych Involvement: No (comment)  Admission diagnosis:  Tachycardia [R00.0] Pulmonary emboli (HCC) [I26.99] Acute pulmonary embolism, unspecified pulmonary embolism type, unspecified whether acute cor pulmonale present [facility]) [I26.99] Patient Active Problem List   Diagnosis Date Noted   Paroxysmal atrial fibrillation with RVR ([place]) 09/21/2021   GERD (gastroesophageal reflux disease) 09/21/2021   Demand ischemia ([place]) 09/21/2021   Pulmonary emboli ([place]) 09/20/2021   New onset atrial fibrillation ([place]) 08/02/2021   Fall at home, initial encounter 08/02/2021   Rotator cuff tear arthropathy, right 08/02/2021   Chronic back pain 08/02/2021   DNR (do not resuscitate) 08/02/2021   Hypokalemia 09/07/2013   Cancer of breast, intraductal    HTN (hypertension)    Dyslipidemia    Breast cancer, left ([place]) 09/26/2011   DCIS (ductal carcinoma in situ) of breast 09/12/2011   PCP:  Mayra Neer, MD Pharmacy:   [facility] [address], [place] [address] Phone: [phone] Fax: [place], [place] [address] [place] AT [facility] [place] [address] Phone: [phone] Fax: [phone]     Social  Determinants of Health (SDOH) Interventions    Readmission Risk Interventions     No data to display

## 2021-09-22 NOTE — Progress Notes (Signed)
Echo attempted approximately 1100 and pt refused. Will reattempt as time permits.

## 2021-09-22 NOTE — Progress Notes (Signed)
PROGRESS NOTE    Judy [name]  BOF:[id number] DOB: January 30, 1931 DOA: 09/20/2021 PCP: Mayra Neer, MD   Brief Narrative:  88 year old with history of HTN, A-fib not on anticoagulation due to risk of fall, GERD, HLD, dementia admitted to the hospital for large central and subsegmental bilateral PE.  CT chest showed right heart strain, she was started on heparin drip.  It was determined she was not a candidate for thrombolytics.  Lower extremity Dopplers was positive for RLE DVT.   Assessment & Plan:  Principal Problem:   Pulmonary emboli (HCC) Active Problems:   Hypokalemia   HTN (hypertension)   Dyslipidemia   Paroxysmal atrial fibrillation with RVR (HCC)   GERD (gastroesophageal reflux disease)   Demand ischemia (HCC)    Large Central and subsegmental bilateral PE - Confirmed on CTA chest with right heart strain.  Echocardiogram pending - Seen by pulmonary, not a candidate for thrombolytics.  Transition IV heparin to Eliquis - Echocardiogram-refusing echo - Lower extremity Dopplers-RLE DVT  Paroxysmal A-fib - Continue Cardizem.  Increased Toprol-XL 250 mg daily - IV as needed ordered.  Essential hypertension - Continue Toprol and Cardizem.  IV as needed ordered  GERD - PPI  Hyperlipidemia - Statin  Moderate dementia - Supportive care  Hypokalemia - As needed repletion   PT/OT ordered   DVT prophylaxis: Heparin drip Code Status: DNR Family Communication: Met with nephew at bedside  Status is: Inpatient Remains inpatient appropriate because: Unsafe disposition.  Once bed available she can be discharged.  PT/OT pending currently      Subjective: Patient seen and examined at bedside this morning.  She continues to refuse an echocardiogram.  Patient's nephew is also present at bedside and is aware of this.   Examination:  General exam: Appears calm and comfortable  Respiratory system: Clear to auscultation. Respiratory effort normal. Cardiovascular  system: S1 & S2 heard, RRR. No JVD, murmurs, rubs, gallops or clicks. No pedal edema. Gastrointestinal system: Abdomen is nondistended, soft and nontender. No organomegaly or masses felt. Normal bowel sounds heard. Central nervous system: Alert and oriented to name, and place. No focal deficit.s  Extremities: Symmetric 4 x 5 power. Skin: No rashes, lesions or ulcers Psychiatry: Judgement and insight appear Poor    Objective: Vitals:   09/21/21 2028 09/22/21 0030 09/22/21 0400 09/22/21 0807  BP: (!) 129/104 (!) 149/118 (!) 149/118 (!) 115/93  Pulse: 95 (!) 113 (!) 106 (!) 109  Resp: _0 Temp: 97.8 F (36.6 C) 98.6 F (37 C) 98.7 F (37.1 C) 98.5 F (36.9 C)  TempSrc: Oral Oral Oral Oral  SpO2: 95% 98% 97% 98%  Weight:      Height:        Intake/Output Summary (Last 24 hours) at 09/22/2021 0819 Last data filed at 09/22/2021 0349 Gross per 24 hour  Intake 453.33 ml  Output 250 ml  Net 203.33 ml   Filed Weights   09/20/21 1824  Weight: 64 kg     Data Reviewed:   CBC: Recent Labs  Lab 09/20/21 1419 09/21/21 0925 09/22/21 0519  WBC 8.2 13.1* 13.5*  NEUTROABS 6.3  --   --   HGB 13.6 13.6 15.3*  HCT 40.5 40.3 44.6  MCV 91.0 90.4 90.3  PLT 447* 451* 778   Basic Metabolic Panel: Recent Labs  Lab 09/20/21 1419 09/21/21 0603 09/22/21 0519  NA 135 136 138  K 4.5 3.0* 3.4*  CL 101 100 99  CO2 25 17* 19*  GLUCOSE 112* 140* 105*  BUN 24* 19 21  CREATININE 0.97 1.05* 1.09*  CALCIUM 9.3 9.1 9.3  MG 1.7 1.7  --   PHOS  --  4.1  --    GFR: Estimated Creatinine Clearance: 30.8 mL/min (A) (by C-G formula based on SCr of 1.09 mg/dL (H)). Liver Function Tests: Recent Labs  Lab 09/20/21 1419  AST 24  ALT 21  ALKPHOS 141*  BILITOT 0.9  PROT 6.2*  ALBUMIN 2.8*   No results for input(s): "LIPASE", "AMYLASE" in the last 168 hours. No results for input(s): "AMMONIA" in the last 168 hours. Coagulation Profile: Recent Labs  Lab 09/20/21 2256  INR 1.1    Cardiac Enzymes: No results for input(s): "CKTOTAL", "CKMB", "CKMBINDEX", "TROPONINI" in the last 168 hours. BNP (last 3 results) No results for input(s): "PROBNP" in the last 8760 hours. HbA1C: No results for input(s): "HGBA1C" in the last 72 hours. CBG: Recent Labs  Lab 09/21/21 0845  GLUCAP 128*   Lipid Profile: No results for input(s): "CHOL", "HDL", "LDLCALC", "TRIG", "CHOLHDL", "LDLDIRECT" in the last 72 hours. Thyroid Function Tests: No results for input(s): "TSH", "T4TOTAL", "FREET4", "T3FREE", "THYROIDAB" in the last 72 hours. Anemia Panel: No results for input(s): "VITAMINB12", "FOLATE", "FERRITIN", "TIBC", "IRON", "RETICCTPCT" in the last 72 hours. Sepsis Labs: No results for input(s): "PROCALCITON", "LATICACIDVEN" in the last 168 hours.  No results found for this or any previous visit (from the past 240 hour(s)).       Radiology Studies: VAS [name] LOWER EXTREMITY VENOUS (DVT)  Result Date: 09/21/2021  Lower Venous DVT Study Patient Name:  Judy [name]  Date of Exam:   09/21/2021 Medical Rec #: [id number]     Accession #:    [id number] Date of Birth: 04/01/1930     Patient Gender: F Patient Age:   44 years Exam Location:  [facility] Procedure:      VAS [name] LOWER EXTREMITY VENOUS (DVT) Referring Phys: RAVI PAHWANI --------------------------------------------------------------------------------  Indications: Pulmonary embolism.  Comparison Study: No previous exam Performing Technologist: Bobetta Lime BS, RVT  Examination Guidelines: A complete evaluation includes B-mode imaging, spectral Doppler, color Doppler, and power Doppler as needed of all accessible portions of each vessel. Bilateral testing is considered an integral part of a complete examination. Limited examinations for reoccurring indications may be performed as noted. The reflux portion of the exam is performed with the patient in reverse Trendelenburg.   +---------+---------------+---------+-----------+----------+--------------+ RIGHT    CompressibilityPhasicitySpontaneityPropertiesThrombus Aging +---------+---------------+---------+-----------+----------+--------------+ CFV      Full           Yes      Yes                                 +---------+---------------+---------+-----------+----------+--------------+ SFJ      Full                                                        +---------+---------------+---------+-----------+----------+--------------+ FV Prox  Full                                                        +---------+---------------+---------+-----------+----------+--------------+  FV Mid   Full                                                        +---------+---------------+---------+-----------+----------+--------------+ FV DistalFull                                                        +---------+---------------+---------+-----------+----------+--------------+ PFV      Full                                                        +---------+---------------+---------+-----------+----------+--------------+ POP      None           No       No                   Acute          +---------+---------------+---------+-----------+----------+--------------+ PTV      None           No       No                   Acute          +---------+---------------+---------+-----------+----------+--------------+ PERO     None           No       No                   Acute          +---------+---------------+---------+-----------+----------+--------------+   +---------+---------------+---------+-----------+----------+--------------+ LEFT     CompressibilityPhasicitySpontaneityPropertiesThrombus Aging +---------+---------------+---------+-----------+----------+--------------+ CFV      Full           Yes      Yes                                  +---------+---------------+---------+-----------+----------+--------------+ SFJ      Full                                                        +---------+---------------+---------+-----------+----------+--------------+ FV Prox  Full                                                        +---------+---------------+---------+-----------+----------+--------------+ FV Mid   Full                                                        +---------+---------------+---------+-----------+----------+--------------+ FV DistalFull                                                        +---------+---------------+---------+-----------+----------+--------------+  PFV      Full                                                        +---------+---------------+---------+-----------+----------+--------------+ POP      Full           Yes      Yes                                 +---------+---------------+---------+-----------+----------+--------------+ PTV      Full                                                        +---------+---------------+---------+-----------+----------+--------------+ PERO     Full                                                        +---------+---------------+---------+-----------+----------+--------------+     Summary: BILATERAL: -No evidence of popliteal cyst, bilaterally. RIGHT: - Findings consistent with acute deep vein thrombosis involving the right popliteal vein, right posterior tibial veins, and right peroneal veins.  LEFT: - No evidence of deep vein thrombosis in the lower extremity. No indirect evidence of obstruction proximal to the inguinal ligament.  *See table(s) above for measurements and observations. Electronically signed by Deitra Mayo MD on 09/21/2021 at 7:19:32 PM.    Final    DG Tibia/Fibula Right  Result Date: 09/21/2021 CLINICAL DATA:  88 year old female with right lower extremity pain after fall. EXAM: RIGHT TIBIA AND  FIBULA - 2 VIEW COMPARISON:  None Available. FINDINGS: There is no evidence of fracture or other focal bone lesions. Soft tissues are unremarkable. IMPRESSION: No acute fracture or malalignment. Electronically Signed   By: Ruthann Cancer M.D.   On: 09/21/2021 11:22   CT Angio Chest PE W and/or Wo Contrast  Result Date: 09/20/2021 CLINICAL DATA:  Progressive weakness for 2 weeks, lower extremity edema EXAM: CT ANGIOGRAPHY CHEST WITH CONTRAST TECHNIQUE: Multidetector CT imaging of the chest was performed using the standard protocol during bolus administration of intravenous contrast. Multiplanar CT image reconstructions and MIPs were obtained to evaluate the vascular anatomy. RADIATION DOSE REDUCTION: This exam was performed according to the departmental dose-optimization program which includes automated exposure control, adjustment of the mA and/or kV according to patient size and/or use of iterative reconstruction technique. CONTRAST:  19m OMNIPAQUE IOHEXOL 350 MG/ML SOLN COMPARISON:  09/20/2021 FINDINGS: Cardiovascular: This is a technically adequate evaluation of the pulmonary vasculature. There is a large pulmonary embolus within the right main pulmonary artery, with complete occlusion of the right middle and right lower lobe pulmonary arteries as result. Central embolus is seen within the left main pulmonary artery, extending into the segmental branches. There is a large clot burden, with evidence of right ventricular strain with RV/LV ratio measuring 1.48. There is significant right atrial dilation with reflux of contrast into the hepatic veins consistent with cardiac dysfunction. No pericardial effusion. Normal caliber of the  thoracic aorta. Atherosclerosis of the aorta and coronary vasculature. Mediastinum/Nodes: No enlarged mediastinal, hilar, or axillary lymph nodes. Thyroid gland, trachea, and esophagus demonstrate no significant findings. Lungs/Pleura: Dense right middle and right lower lobe  consolidation likely reflect pulmonary infarct given large central pulmonary emboli. Moderate right pleural effusion measures less than 1 L. No pneumothorax. Upper Abdomen: Calcified gallstones. No other acute upper abdominal findings. Musculoskeletal: No acute or destructive bony lesions. Reconstructed images demonstrate no additional findings. Review of the MIP images confirms the above findings. IMPRESSION: 1. Large central and segmental bilateral pulmonary emboli, most pronounced within the right main pulmonary artery as above. CT evidence of right heart strain (RV/LV Ratio = 1.48) consistent with at least submassive (intermediate risk) PE. The presence of right heart strain has been associated with an increased risk of morbidity and mortality. Please refer to the "Code PE Focused" order set in EPIC. 2. Dense right middle and right lower lobe consolidation most consistent with pulmonary infarct given central pulmonary emboli. 3. Moderate right pleural effusion measures less than 1 L. 4.  Aortic Atherosclerosis (ICD10-I70.0). 5. Cholelithiasis. Critical Value/emergent results were called by telephone at the time of interpretation on 09/20/2021 at 10:17 pm to provider DR [place], who verbally acknowledged these results. Electronically Signed   By: Randa Ngo M.D.   On: 09/20/2021 22:19   CT HEAD WO CONTRAST (5MM)  Result Date: 09/20/2021 CLINICAL DATA:  Head trauma, minor (Age >= 65y); Neck trauma (Age >= 65y) EXAM: CT HEAD WITHOUT CONTRAST CT CERVICAL SPINE WITHOUT CONTRAST TECHNIQUE: Multidetector CT imaging of the head and cervical spine was performed following the standard protocol without intravenous contrast. Multiplanar CT image reconstructions of the cervical spine were also generated. RADIATION DOSE REDUCTION: This exam was performed according to the departmental dose-optimization program which includes automated exposure control, adjustment of the mA and/or kV according to patient size and/or use of  iterative reconstruction technique. COMPARISON:  None Available. FINDINGS: CT HEAD FINDINGS BRAIN: BRAIN Cerebral ventricle sizes are concordant with the degree of cerebral volume loss. Patchy and confluent areas of decreased attenuation are noted throughout the deep and periventricular white matter of the cerebral hemispheres bilaterally, compatible with chronic microvascular ischemic disease. No evidence of large-territorial acute infarction. No parenchymal hemorrhage. No mass lesion. No extra-axial collection. No mass effect or midline shift. No hydrocephalus. Basilar cisterns are patent. Vascular: No hyperdense vessel. Skull: No acute fracture or focal lesion. Sinuses/Orbits: Paranasal sinuses and mastoid air cells are clear. Bilateral lens replacement. Otherwise The orbits are unremarkable. Other: None. CT CERVICAL SPINE FINDINGS Alignment: Normal. Skull base and vertebrae: Multilevel severe degenerative changes of the spine. No severe osseous neural foraminal or central canal stenosis. No acute fracture. No aggressive appearing focal osseous lesion or focal pathologic process. Soft tissues and spinal canal: No prevertebral fluid or swelling. No visible canal hematoma. Upper chest: Unremarkable. Other: Right pleural effusion. Please see separately dictated CT angiography 10/10/2021 chest for positive findings. Atherosclerotic plaque of the carotid arteries within the neck. IMPRESSION: 1. No acute intracranial abnormality. 2. No acute displaced fracture or traumatic listhesis of the cervical spine. 3. Right pleural effusion. Please see separately dictated CT angiography 10/10/2021 chest for positive findings. Electronically Signed   By: Iven Finn M.D.   On: 09/20/2021 22:13   CT Cervical Spine Wo Contrast  Result Date: 09/20/2021 CLINICAL DATA:  Head trauma, minor (Age >= 65y); Neck trauma (Age >= 65y) EXAM: CT HEAD WITHOUT CONTRAST CT CERVICAL SPINE WITHOUT CONTRAST TECHNIQUE: Multidetector CT  imaging  of the head and cervical spine was performed following the standard protocol without intravenous contrast. Multiplanar CT image reconstructions of the cervical spine were also generated. RADIATION DOSE REDUCTION: This exam was performed according to the departmental dose-optimization program which includes automated exposure control, adjustment of the mA and/or kV according to patient size and/or use of iterative reconstruction technique. COMPARISON:  None Available. FINDINGS: CT HEAD FINDINGS BRAIN: BRAIN Cerebral ventricle sizes are concordant with the degree of cerebral volume loss. Patchy and confluent areas of decreased attenuation are noted throughout the deep and periventricular white matter of the cerebral hemispheres bilaterally, compatible with chronic microvascular ischemic disease. No evidence of large-territorial acute infarction. No parenchymal hemorrhage. No mass lesion. No extra-axial collection. No mass effect or midline shift. No hydrocephalus. Basilar cisterns are patent. Vascular: No hyperdense vessel. Skull: No acute fracture or focal lesion. Sinuses/Orbits: Paranasal sinuses and mastoid air cells are clear. Bilateral lens replacement. Otherwise The orbits are unremarkable. Other: None. CT CERVICAL SPINE FINDINGS Alignment: Normal. Skull base and vertebrae: Multilevel severe degenerative changes of the spine. No severe osseous neural foraminal or central canal stenosis. No acute fracture. No aggressive appearing focal osseous lesion or focal pathologic process. Soft tissues and spinal canal: No prevertebral fluid or swelling. No visible canal hematoma. Upper chest: Unremarkable. Other: Right pleural effusion. Please see separately dictated CT angiography 10/10/2021 chest for positive findings. Atherosclerotic plaque of the carotid arteries within the neck. IMPRESSION: 1. No acute intracranial abnormality. 2. No acute displaced fracture or traumatic listhesis of the cervical spine. 3. Right  pleural effusion. Please see separately dictated CT angiography 10/10/2021 chest for positive findings. Electronically Signed   By: Iven Finn M.D.   On: 09/20/2021 22:13   DG Shoulder Left  Result Date: 09/20/2021 CLINICAL DATA:  Left shoulder pain EXAM: LEFT SHOULDER - 2+ VIEW COMPARISON:  August 02, 2021 FINDINGS: Diffuse osteopenia. No acute fracture or dislocation. Moderate glenohumeral and acromioclavicular joint degenerative changes. Decreased subacromial space, suggestive of chronic rotator cuff pathology. The visualized left lung is clear. IMPRESSION: 1. No acute fracture or dislocation. 2. Left glenohumeral and acromioclavicular degenerative changes, similar to prior. Electronically Signed   By: Beryle Flock M.D.   On: 09/20/2021 14:08   DG Chest Portable 1 View  Result Date: 09/20/2021 CLINICAL DATA:  Left shoulder pain, chest pain EXAM: PORTABLE CHEST 1 VIEW COMPARISON:  08/13/2021 FINDINGS: Right lower and middle lobe opacities. Central pulmonary vascular congestion. Small right pleural effusion is not excluded. No pneumothorax. Cardiomegaly. IMPRESSION: Central pulmonary vascular congestion with right lower and middle lobe opacities. Small right pleural effusion is not excluded. Mild cardiomegaly. May reflect pneumonia or edema. Electronically Signed   By: Macy Mis M.D.   On: 09/20/2021 14:05        Scheduled Meds:  atorvastatin  10 mg Oral q1800   diltiazem  120 mg Oral Daily   metoprolol succinate  150 mg Oral Daily   potassium chloride  40 mEq Oral Once   Continuous Infusions:  heparin 1,100 Units/hr (09/22/21 0305)     LOS: 2 days   Time spent= 35 mins    Daysha Ashmore Arsenio Loader, MD [facility]  If 7PM-7AM, please contact night-coverage  09/22/2021, 8:19 AM

## 2021-09-22 NOTE — Telephone Encounter (Signed)
Patient is scheduled on 12/03/2021 at 9:30am with Dr. [place] Bells- appointment reminder mailed to address on file. Nothing further needed.

## 2021-09-22 NOTE — Progress Notes (Signed)
  Transition of Care [facility]) Screening Note   Patient Details  Name: Judy [name] Date of Birth: 03-29-1930   Transition of Care [facility]) CM/SW Contact:    Cyndi Bender, RN Phone Number: 09/22/2021, 1:55 PM    Transition of Care Department [facility]) has reviewed patient and no TOC needs have been identified at this time. We will continue to monitor patient advancement through interdisciplinary progression rounds. If new patient transition needs arise, please place a TOC consult.

## 2021-09-22 NOTE — Progress Notes (Addendum)
ANTICOAGULATION CONSULT NOTE - Initial Consult  Pharmacy Consult for Heparin Indication: pulmonary embolus  Allergies  Allergen Reactions   Amoxil [Amoxicillin] Nausea Only   Codeine Nausea Only   Aricept [Donepezil Hcl] Other (See Comments)    "Felt drunk" Headaches Drowsiness   Catapres [Clonidine] Other (See Comments)    Dry mouth   Hycodan [Hydrocodone Bit-Homatrop Mbr] Diarrhea and Nausea And Vomiting   Norco [Hydrocodone-Acetaminophen] Diarrhea and Nausea And Vomiting   Norvasc [Amlodipine] Swelling   Prolia [Denosumab] Other (See Comments)    Unknown reaction   Ultram [Tramadol] Nausea Only   Zoloft [Sertraline] Nausea Only   Clavulanic Acid Hives and Rash    Patient Measurements: Height: _0  (157.5 cm) Weight: 64 kg (141 lb 1.5 oz) IBW/kg (Calculated) : 50.1 Heparin Dosing Weight: 63 kg  Vital Signs: Temp: 98.7 F (37.1 C) (08/09 0400) Temp Source: Oral (08/09 0400) BP: 149/118 (08/09 0400) Pulse Rate: 106 (08/09 0400)  Labs: Recent Labs    09/20/21 1419 09/20/21 1616 09/20/21 2256 09/21/21 0603 09/21/21 0817 09/21/21 0925 09/21/21 1515 09/22/21 0519  HGB 13.6  --   --   --   --  13.6  --  15.3*  HCT 40.5  --   --   --   --  40.3  --  44.6  PLT 447*  --   --   --   --  451*  --  394  APTT  --   --  29  --   --   --   --   --   LABPROT  --   --  14.1  --   --   --   --   --   INR  --   --  1.1  --   --   --   --   --   HEPARINUNFRC  --   --   --  0.33  --   --  0.52 0.69  CREATININE 0.97  --   --  1.05*  --   --   --  1.09*  TROPONINIHS 23* 21*  --  43* 47*  --   --   --      Estimated Creatinine Clearance: 30.8 mL/min (A) (by C-G formula based on SCr of 1.09 mg/dL (H)).   Medical History: Past Medical History:  Diagnosis Date   Blood transfusion    Breast cancer, left ([place]) 09/26/2011   Punch biopsies of the left nipple done on 09/16/2011.  ER and PR were negative.  HER2 is positive with ratio 4.68.Had mastectomy on 10/25/11. Path T1,N0.     Chronic low back pain    Dyslipidemia    GERD (gastroesophageal reflux disease)    HTN (hypertension)    Osteoarthritis    Osteoporosis     Medications:  Medications Prior to Admission  Medication Sig Dispense Refill Last Dose   acetaminophen (TYLENOL) 500 MG tablet Take 500-1,000 mg by mouth See admin instructions. 1000 mg every 12 hours + 500 mg every 6 hours PRN for fever 99.5-101, minor headache, minor discomfort.   09/20/2021   atorvastatin (LIPITOR) 10 MG tablet Take 1 tablet (10 mg total) by mouth daily at 6 PM. (Patient taking differently: Take 10 mg by mouth at bedtime.) 30 tablet 1 09/20/2021   diltiazem (CARDIZEM CD) 120 MG 24 hr capsule Take 1 capsule (120 mg total) by mouth daily. 30 capsule 1 09/20/2021   hydrochlorothiazide (HYDRODIURIL) 25 MG tablet Take 25 mg by  mouth daily.   09/20/2021   ibuprofen (ADVIL) 200 MG tablet Take 400 mg by mouth every 6 (six) hours as needed (pain).   Past Week   lisinopril (PRINIVIL,ZESTRIL) 40 MG tablet Take 40 mg by mouth daily.   09/20/2021   metoprolol succinate (TOPROL-XL) 100 MG 24 hr tablet Take 100 mg by mouth daily.   09/19/2021 at 20:36   Omega-3 Fatty Acids (OMEGA-3 PO) Take 1 capsule by mouth daily. Omega 3 - dha - epa - fish oil [phone] mg capsule   09/20/2021   potassium chloride SA (KLOR-CON M) 20 MEQ tablet Take 20 mEq by mouth daily.   09/20/2021   saccharomyces boulardii (FLORASTOR) 250 MG capsule Take 250 mg by mouth 2 (two) times daily.   09/20/2021   cephALEXin (KEFLEX) 500 MG capsule Take 1 capsule (500 mg total) by mouth 3 (three) times daily. (Patient not taking: Reported on 09/21/2021) 21 capsule 0 Not Taking    Scheduled:   atorvastatin  10 mg Oral q1800   diltiazem  120 mg Oral Daily   metoprolol succinate  150 mg Oral Daily   Infusions:   heparin 1,100 Units/hr (09/22/21 0305)   PRN:   Assessment: 4 yof with a history of remote breast cancer, HTN, HLD, atrial fibrillation, GERD, arthritis. Patient is presenting with  weakness. Heparin per pharmacy consult placed for pulmonary embolus.  CTA PE w/ bilateral PE per ED MD note.  Patient is not on anticoagulation prior to arrival.  Heperin level therapeutic at 0.69 this AM Plan to transition to apixaban soon after ECHO.  Goal of Therapy:  Heparin level 0.3-0.7 units/ml Monitor platelets by anticoagulation protocol: Yes   Plan:  Cont heparin infusion at 1100 units/hr Check anti-Xa level daily while on heparin Continue to monitor H&H and platelets  Kasi Lasky A. Levada Dy, PharmD, BCPS, FNKF Clinical Pharmacist [place] Please utilize Amion for appropriate phone number to reach the unit pharmacist ([place])   Addendum: MD wishes to change to apixaban for PE. Will start this afternoon. D/c heparin once first dose given.  Setareh Rom A. Levada Dy, PharmD, BCPS, FNKF Clinical Pharmacist [place] Please utilize Amion for appropriate phone number to reach the unit pharmacist ([place])   09/22/2021 7:29 AM

## 2021-09-23 DIAGNOSIS — E785 Hyperlipidemia, unspecified: Secondary | ICD-10-CM | POA: Diagnosis not present

## 2021-09-23 DIAGNOSIS — I2699 Other pulmonary embolism without acute cor pulmonale: Secondary | ICD-10-CM | POA: Diagnosis not present

## 2021-09-23 DIAGNOSIS — K219 Gastro-esophageal reflux disease without esophagitis: Secondary | ICD-10-CM | POA: Diagnosis not present

## 2021-09-23 DIAGNOSIS — I248 Other forms of acute ischemic heart disease: Secondary | ICD-10-CM | POA: Diagnosis not present

## 2021-09-23 LAB — CBC
HCT: 37.9 % (ref 36.0–46.0)
Hemoglobin: 12.8 g/dL (ref 12.0–15.0)
MCH: 30.3 pg (ref 26.0–34.0)
MCHC: 33.8 g/dL (ref 30.0–36.0)
MCV: 89.6 fL (ref 80.0–100.0)
Platelets: 413 10*3/uL — ABNORMAL HIGH (ref 150–400)
RBC: 4.23 MIL/uL (ref 3.87–5.11)
RDW: 15.8 % — ABNORMAL HIGH (ref 11.5–15.5)
WBC: 11.2 10*3/uL — ABNORMAL HIGH (ref 4.0–10.5)
nRBC: 0 % (ref 0.0–0.2)

## 2021-09-23 LAB — BASIC METABOLIC PANEL
Anion gap: 11 (ref 5–15)
BUN: 39 mg/dL — ABNORMAL HIGH (ref 8–23)
CO2: 26 mmol/L (ref 22–32)
Calcium: 8.7 mg/dL — ABNORMAL LOW (ref 8.9–10.3)
Chloride: 100 mmol/L (ref 98–111)
Creatinine, Ser: 1.23 mg/dL — ABNORMAL HIGH (ref 0.44–1.00)
GFR, Estimated: 42 mL/min — ABNORMAL LOW (ref >60)
Glucose, Bld: 133 mg/dL — ABNORMAL HIGH (ref 70–99)
Potassium: 3.3 mmol/L — ABNORMAL LOW (ref 3.5–5.1)
Sodium: 137 mmol/L (ref 135–145)

## 2021-09-23 MED ORDER — METOPROLOL SUCCINATE ER 50 MG PO TB24: 150.0000 mg | ORAL_TABLET | Freq: Every day | ORAL | 0 refills | Status: DC

## 2021-09-23 MED ORDER — APIXABAN 5 MG PO TABS: ORAL_TABLET | ORAL | 0 refills | Status: DC

## 2021-09-23 MED ORDER — POTASSIUM CHLORIDE 10 MEQ/100ML IV SOLN
10.0000 meq | Freq: Once | INTRAVENOUS | Status: AC
  Administered 2021-09-23: 10 meq via INTRAVENOUS

## 2021-09-23 MED ORDER — POTASSIUM CHLORIDE 20 MEQ PO PACK
20.0000 meq | PACK | Freq: Two times a day (BID) | ORAL | Status: DC
  Administered 2021-09-23: 20 meq via ORAL
  Filled 2021-09-23: qty 1

## 2021-09-23 MED ORDER — POTASSIUM CHLORIDE 10 MEQ/100ML IV SOLN
10.0000 meq | INTRAVENOUS | Status: DC
  Administered 2021-09-23: 10 meq via INTRAVENOUS
  Filled 2021-09-23 (×2): qty 100

## 2021-09-23 NOTE — TOC Transition Note (Signed)
Transition of Care [facility]) - CM/SW Discharge Note   Patient Details  Name: Judy [name] MRN: [id number] Date of Birth: [birth year]  Transition of Care [facility]) CM/SW Contact:  Benard Halsted, LCSW Phone Number: 09/23/2021, 1:48 PM   Clinical Narrative:    Patient will DC to: [facility] Anticipated DC date: 09/23/21 Family notified: [facility] Transport by: Corey Harold   Per MD patient ready for DC to [facility]. RN to call report prior to discharge ([phone]). RN, patient, patient's family, and facility notified of DC. Discharge Summary and FL2 sent to facility. DC packet on chart including signed DNR and scripts. Ambulance transport requested for patient.   CSW will sign off for now as social work intervention is no longer needed. Please consult [name] again if new needs arise.     Final next level of care: Assisted Living Barriers to Discharge: Barriers Resolved   Patient Goals and CMS Choice Patient states their goals for this hospitalization and ongoing recovery are:: Return to ALF CMS Medicare.gov Compare Post Acute Care list provided to:: Patient Represenative (must comment) Choice offered to / list presented to :  [place] Puls)  Discharge Placement              Patient chooses bed at:  [facility]) Patient to be transferred to facility by: [place] Name of family member notified: Nephew Patient and family notified of of transfer: 09/23/21  Discharge Plan and Services In-house Referral: Clinical Social Work   Post Acute Care Choice: Resumption of Svcs/PTA Provider                               Social Determinants of Health (SDOH) Interventions     Readmission Risk Interventions     No data to display

## 2021-09-23 NOTE — Evaluation (Signed)
Occupational Therapy Evaluation Patient Details Name: Judy [name] MRN: [id number] DOB: 06-Feb-1931 Today's Date: 09/23/2021   History of Present Illness 88 yo female presents to [facility] from ALF on 8/7 for progressive weakness, BLE edema. CTA demonstrating bilat PE and LE [name] finds DVT of R popliteal vein, R posterior tibial veins, and R peroneal veins. Fall in ED on 8/7. Recent admission  07/2021 for dizziness and fall. PMH - breast CA, HTN, back pain, lt rotator cuff repair, back surgery, osteoporosis, dementia.   Clinical Impression   Pt typically walks with a walker and is assisted for ADLs. She is likely very near her baseline in cognition and ADLs. Recommend return to ALF upon discharge. No further OT needs.      Recommendations for follow up therapy are one component of a multi-disciplinary discharge planning process, led by the attending physician.  Recommendations may be updated based on patient status, additional functional criteria and insurance authorization.   Follow Up Recommendations  Other (comment) (ALF)    Assistance Recommended at Discharge Frequent or constant Supervision/Assistance  Patient can return home with the following A little help with walking and/or transfers;A lot of help with bathing/dressing/bathroom    Functional Status Assessment  Patient has had a recent decline in their functional status and demonstrates the ability to make significant improvements in function in a reasonable and predictable amount of time.  Equipment Recommendations  None recommended by OT    Recommendations for Other Services       Precautions / Restrictions Precautions Precautions: Fall Restrictions Weight Bearing Restrictions: No      Mobility Bed Mobility Overal bed mobility: Needs Assistance Bed Mobility: Supine to Sit, Sit to Supine     Supine to sit: Min assist Sit to supine: Supervision        Transfers Overall transfer level: Needs assistance Equipment used:  Rolling walker (2 wheels) Transfers: Sit to/from Stand Sit to Stand: Min assist           General transfer comment: light steadying assist upon standing, cues for hand placement when rising      Balance Overall balance assessment: Needs assistance   Sitting balance-Leahy Scale: Fair     Standing balance support: Bilateral upper extremity supported, During functional activity, Reliant on assistive device for balance Standing balance-Leahy Scale: Poor Standing balance comment: reliant on at least one hand for balance                           ADL either performed or assessed with clinical judgement   ADL Overall ADL's : Needs assistance/impaired Eating/Feeding: Set up   Grooming: Wash/dry face;Wash/dry hands;Sitting;Set up   Upper Body Bathing: Moderate assistance;Sitting   Lower Body Bathing: Maximal assistance;Sit to/from stand   Upper Body Dressing : Moderate assistance;Sitting   Lower Body Dressing: Maximal assistance;Sit to/from stand   Toilet Transfer: Minimal assistance;Ambulation;Rolling walker (2 wheels)   Toileting- Clothing Manipulation and Hygiene: Moderate assistance;Sit to/from stand       Functional mobility during ADLs: Minimal assistance;Rolling walker (2 wheels)       Vision Ability to See in Adequate Light: 0 Adequate Patient Visual Report: No change from baseline       Perception     Praxis      Pertinent Vitals/Pain Pain Assessment Pain Assessment: No/denies pain     Hand Dominance Right   Extremity/Trunk Assessment Upper Extremity Assessment Upper Extremity Assessment: Overall WFL for tasks assessed  Lower Extremity Assessment Lower Extremity Assessment: Defer to PT evaluation   Cervical / Trunk Assessment Cervical / Trunk Assessment: Normal   Communication Communication Communication: HOH   Cognition Arousal/Alertness: Awake/alert Behavior During Therapy: WFL for tasks assessed/performed Overall Cognitive  Status: History of cognitive impairments - at baseline                                       General Comments  no dyspnea on exertion    Exercises     Shoulder Instructions      Home Living Family/patient expects to be discharged to:: Assisted living Living Arrangements: Other (Comment) (ALF) Available Help at Discharge: Family;Available PRN/intermittently Type of Home: Assisted living                       Home Equipment: Rolling Walker (2 wheels)          Prior Functioning/Environment Prior Level of Function : History of Falls (last six months);Needs assist             Mobility Comments: pt states she uses RW for gait ADLs Comments: pt states she gets help with dressing and bathing self, states staff at ALF "clean me up"        OT Problem List: Impaired balance (sitting and/or standing)      OT Treatment/Interventions:      OT Goals(Current goals can be found in the care plan section) Acute Rehab OT Goals OT Goal Formulation: With family  OT Frequency:      Co-evaluation              AM-PAC OT "6 Clicks" Daily Activity     Outcome Measure Help from another person eating meals?: None Help from another person taking care of personal grooming?: A Little Help from another person toileting, which includes using toliet, bedpan, or urinal?: A Lot Help from another person bathing (including washing, rinsing, drying)?: A Lot Help from another person to put on and taking off regular upper body clothing?: A Lot Help from another person to put on and taking off regular lower body clothing?: A Lot 6 Click Score: 15   End of Session Equipment Utilized During Treatment: Gait belt;Rolling walker (2 wheels)  Activity Tolerance: Patient tolerated treatment well Patient left: in bed;with call bell/phone within reach;with bed alarm set;with nursing/sitter in room;with family/visitor present  OT Visit Diagnosis: Unsteadiness on feet  (R26.81);Other abnormalities of gait and mobility (R26.89)                Time: 1791-5056 OT Time Calculation (min): 21 min Charges:  OT General Charges $OT Visit: 1 Visit OT Evaluation $OT Eval Moderate Complexity: [place], OTR/L Acute Rehabilitation Services Office: [phone]   Malka So 09/23/2021, 11:49 AM

## 2021-09-23 NOTE — Care Management Important Message (Signed)
Important Message  Patient Details  Name: Judy [name] MRN: [id number] Date of Birth: 1930/07/26   Medicare Important Message Given:  Yes     [name] 09/23/2021, 3:22 PM

## 2021-09-23 NOTE — TOC Progression Note (Signed)
Transition of Care [facility]) - Progression Note    Patient Details  Name: Judy [name] MRN: [id number] Date of Birth: March 22, 1930  Transition of Care [facility]) CM/SW [place], LCSW Phone Number: 09/23/2021, 12:38 PM  Clinical Narrative:    CSW faxed PT note and preliminary FL2 to [facility]. CSW called them and they reported patient is fine to return today. CSW faxed DC Summary, PT/OT orders, Signed FL2, and new med scripts to them. CSW updated patient's nephew, Nori Riis, and will contact [facility] for transport.    Expected Discharge Plan: Assisted Living Barriers to Discharge: Barriers resolved  Expected Discharge Plan and Services Expected Discharge Plan: Assisted Living In-house Referral: Clinical Social Work   Post Acute Care Choice: Resumption of Svcs/PTA Provider Living arrangements for the past 2 months: [place] Expected Discharge Date: 09/23/21                                     Social Determinants of Health (SDOH) Interventions    Readmission Risk Interventions     No data to display

## 2021-09-23 NOTE — Progress Notes (Signed)
Report called to [facility] at [place].

## 2021-09-23 NOTE — Discharge Instructions (Addendum)
Start Eliquis Twice daily. '10mg'$  PO BID until 8/15; thereafter '5mg'$  PO BID Avoid NSAID use and monitor for bleeding.    Information on my medicine - ELIQUIS (apixaban)  This medication education was reviewed with me or my healthcare representative as part of my discharge preparation.   Why was Eliquis prescribed for you? Eliquis was prescribed to treat blood clots that may have been found in the veins of your legs (deep vein thrombosis) or in your lungs (pulmonary embolism) and to reduce the risk of them occurring again.  What do You need to know about Eliquis ? The starting dose is 10 mg (two 5 mg tablets) taken TWICE daily for the FIRST SEVEN (7) DAYS, then on 09/29/21  the dose is reduced to ONE 5 mg tablet taken TWICE daily.  Eliquis may be taken with or without food.   Try to take the dose about the same time in the morning and in the evening. If you have difficulty swallowing the tablet whole please discuss with your pharmacist how to take the medication safely.  Take Eliquis exactly as prescribed and DO NOT stop taking Eliquis without talking to the doctor who prescribed the medication.  Stopping may increase your risk of developing a new blood clot.  Refill your prescription before you run out.  After discharge, you should have regular check-up appointments with your healthcare provider that is prescribing your Eliquis.    What do you do if you miss a dose? If a dose of ELIQUIS is not taken at the scheduled time, take it as soon as possible on the same day and twice-daily administration should be resumed. The dose should not be doubled to make up for a missed dose.  Important Safety Information A possible side effect of Eliquis is bleeding. You should call your healthcare provider right away if you experience any of the following: Bleeding from an injury or your nose that does not stop. Unusual colored urine (red or dark brown) or unusual colored stools (red or  black). Unusual bruising for unknown reasons. A serious fall or if you hit your head (even if there is no bleeding).  Some medicines may interact with Eliquis and might increase your risk of bleeding or clotting while on Eliquis. To help avoid this, consult your healthcare provider or pharmacist prior to using any new prescription or non-prescription medications, including herbals, vitamins, non-steroidal anti-inflammatory drugs (NSAIDs) and supplements.  This website has more information on Eliquis (apixaban): http://www.eliquis.com/eliquis/home

## 2021-09-23 NOTE — Discharge Summary (Signed)
Physician Discharge Summary  Judy [name]:[id number] DOB: 03-Dec-1930 DOA: 09/20/2021  PCP: Mayra Neer, MD  Admit date: 09/20/2021 Discharge date: 09/23/2021  Admitted From: ALF Disposition: ALF  Recommendations for Outpatient Follow-up:  Follow up with PCP in 1-2 weeks Please obtain BMP/CBC in one week your next doctors visit.  Take Eliquis 10 mg twice daily until 8/15 thereafter 5 mg twice daily at least for next 6 months to 1 year.  The length of treatment eventually can be determined on by outpatient provider Toprol-XL has been increased 150 mg daily For now I will discontinue lisinopril, ibuprofen.  No longer on Keflex as well.  Home Health: PT/OT Equipment/Devices: None Discharge Condition: Stable CODE STATUS: DNR Diet recommendation: 2 g salt  Brief/Interim Summary: 88 year old with history of HTN, A-fib not on anticoagulation due to risk of fall, GERD, HLD, dementia admitted to the hospital for large central and subsegmental bilateral PE.  CT chest showed right heart strain, she was started on heparin drip.  It was determined she was not a candidate for thrombolytics.  Lower extremity Dopplers was positive for RLE DVT.  Patient declined echocardiogram.  Eventually she was transitioned to oral Eliquis with recommendations as mentioned above.  PT/OT recommended home health, arrangements were made.  Today she is medically stable for discharge.     Assessment & Plan:  Principal Problem:   Pulmonary emboli (HCC) Active Problems:   Hypokalemia   HTN (hypertension)   Dyslipidemia   Paroxysmal atrial fibrillation with RVR (HCC)   GERD (gastroesophageal reflux disease)   Demand ischemia (HCC)     Large Central and subsegmental bilateral PE - Confirmed on CTA chest with right heart strain.  - Seen by pulmonary, not a candidate for thrombolytics.  Transition to oral Eliquis - Echocardiogram-refusing echo - Lower extremity Dopplers-RLE DVT   Paroxysmal A-fib - Continue  Cardizem.  Increased Toprol-XL 150 mg daily.   Essential hypertension - Continue Toprol and Cardizem.  Will discontinue lisinopril now but slowly can be added as appropriate   GERD - PPI   Hyperlipidemia - Statin   Moderate dementia - Supportive care   Hypokalemia - As needed repletion   PT/OT-home health   Assessment and Plan: No notes have been filed under this hospital service. Service: Hospitalist      Body mass index is 26.01 kg/m.       Discharge Diagnoses:  Principal Problem:   Pulmonary emboli (HCC) Active Problems:   Hypokalemia   HTN (hypertension)   Dyslipidemia   Paroxysmal atrial fibrillation with RVR (HCC)   GERD (gastroesophageal reflux disease)   Demand ischemia (HCC)      Consultations: Pulmonary  Subjective: Feels well no complaints Nephew at bedside.  Discharge Exam: Vitals:   09/23/21 0400 09/23/21 1055  BP: 128/84 (!) 141/102  Pulse: (!) 101   Resp: 16   Temp: 97.8 F (36.6 C) 97.8 F (36.6 C)  SpO2: 98%    Vitals:   09/23/21 0000 09/23/21 0400 09/23/21 0500 09/23/21 1055  BP: 118/75 128/84  (!) 141/102  Pulse: 96 (!) 101    Resp: 16 16    Temp: 97.9 F (36.6 C) 97.8 F (36.6 C)  97.8 F (36.6 C)  TempSrc: Oral Oral  Oral  SpO2: 96% 98%    Weight:   64.5 kg   Height:        General: Pt is alert, awake, not in acute distress Cardiovascular: RRR, S1/S2 +, no rubs, no gallops Respiratory: CTA bilaterally, no  wheezing, no rhonchi Abdominal: Soft, NT, ND, bowel sounds + Extremities: no edema, no cyanosis  Discharge Instructions   Allergies as of 09/23/2021       Reactions   Amoxil [amoxicillin] Nausea Only   Codeine Nausea Only   Aricept [donepezil Hcl] Other (See Comments)   "Felt drunk" Headaches Drowsiness   Catapres [clonidine] Other (See Comments)   Dry mouth   Hycodan [hydrocodone Bit-homatrop Mbr] Diarrhea, Nausea And Vomiting   Norco [hydrocodone-acetaminophen] Diarrhea, Nausea And Vomiting    Norvasc [amlodipine] Swelling   Prolia [denosumab] Other (See Comments)   Unknown reaction   Ultram [tramadol] Nausea Only   Zoloft [sertraline] Nausea Only   Clavulanic Acid Hives, Rash        Medication List     STOP taking these medications    cephALEXin 500 MG capsule Commonly known as: KEFLEX   ibuprofen 200 MG tablet Commonly known as: ADVIL   lisinopril 40 MG tablet Commonly known as: ZESTRIL       TAKE these medications    acetaminophen 500 MG tablet Commonly known as: TYLENOL Take 500-1,000 mg by mouth See admin instructions. 1000 mg every 12 hours + 500 mg every 6 hours PRN for fever 99.5-101, minor headache, minor discomfort.   apixaban 5 MG Tabs tablet Commonly known as: ELIQUIS Take 2 tablets (10 mg total) by mouth 2 (two) times daily for 6 days, THEN 1 tablet (5 mg total) 2 (two) times daily for 24 days. Start taking on: September 29, 2021   atorvastatin 10 MG tablet Commonly known as: LIPITOR Take 1 tablet (10 mg total) by mouth daily at 6 PM. What changed: when to take this   diltiazem 120 MG 24 hr capsule Commonly known as: Cardizem CD Take 1 capsule (120 mg total) by mouth daily.   hydrochlorothiazide 25 MG tablet Commonly known as: HYDRODIURIL Take 25 mg by mouth daily.   metoprolol succinate 50 MG 24 hr tablet Commonly known as: TOPROL-XL Take 3 tablets (150 mg total) by mouth daily. Take with or immediately following a meal. Start taking on: September 24, 2021 What changed:  medication strength how much to take additional instructions   OMEGA-3 PO Take 1 capsule by mouth daily. Omega 3 - dha - epa - fish oil [phone] mg capsule   potassium chloride SA 20 MEQ tablet Commonly known as: KLOR-CON M Take 20 mEq by mouth daily.   saccharomyces boulardii 250 MG capsule Commonly known as: FLORASTOR Take 250 mg by mouth 2 (two) times daily.        Follow-up Information     Mayra Neer, MD Follow up in 1 week(s).   Specialty:  Family Medicine Contact information: [address] [place] [address] [phone]                Allergies  Allergen Reactions   Amoxil [Amoxicillin] Nausea Only   Codeine Nausea Only   Aricept [Donepezil Hcl] Other (See Comments)    "Felt drunk" Headaches Drowsiness   Catapres [Clonidine] Other (See Comments)    Dry mouth   Hycodan [Hydrocodone Bit-Homatrop Mbr] Diarrhea and Nausea And Vomiting   Norco [Hydrocodone-Acetaminophen] Diarrhea and Nausea And Vomiting   Norvasc [Amlodipine] Swelling   Prolia [Denosumab] Other (See Comments)    Unknown reaction   Ultram [Tramadol] Nausea Only   Zoloft [Sertraline] Nausea Only   Clavulanic Acid Hives and Rash    You were cared for by a hospitalist during your hospital stay. If  you have any questions about your discharge medications or the care you received while you were in the hospital after you are discharged, you can call the unit and asked to speak with the hospitalist on call if the hospitalist that took care of you is not available. Once you are discharged, your primary care physician will handle any further medical issues. Please note that no refills for any discharge medications will be authorized once you are discharged, as it is imperative that you return to your primary care physician (or establish a relationship with a primary care physician if you do not have one) for your aftercare needs so that they can reassess your need for medications and monitor your lab values.   Procedures/Studies: VAS [name] LOWER EXTREMITY VENOUS (DVT)  Result Date: 09/21/2021  Lower Venous DVT Study Patient Name:  Judy [name]  Date of Exam:   09/21/2021 Medical Rec #: [id number]     Accession #:    [id number] Date of Birth: 06/23/1930     Patient Gender: F Patient Age:   30 years Exam Location:  [facility] Procedure:      VAS [name] LOWER EXTREMITY VENOUS (DVT) Referring Phys: RAVI PAHWANI  --------------------------------------------------------------------------------  Indications: Pulmonary embolism.  Comparison Study: No previous exam Performing Technologist: Bobetta Lime BS, RVT  Examination Guidelines: A complete evaluation includes B-mode imaging, spectral Doppler, color Doppler, and power Doppler as needed of all accessible portions of each vessel. Bilateral testing is considered an integral part of a complete examination. Limited examinations for reoccurring indications may be performed as noted. The reflux portion of the exam is performed with the patient in reverse Trendelenburg.  +---------+---------------+---------+-----------+----------+--------------+ RIGHT    CompressibilityPhasicitySpontaneityPropertiesThrombus Aging +---------+---------------+---------+-----------+----------+--------------+ CFV      Full           Yes      Yes                                 +---------+---------------+---------+-----------+----------+--------------+ SFJ      Full                                                        +---------+---------------+---------+-----------+----------+--------------+ FV Prox  Full                                                        +---------+---------------+---------+-----------+----------+--------------+ FV Mid   Full                                                        +---------+---------------+---------+-----------+----------+--------------+ FV DistalFull                                                        +---------+---------------+---------+-----------+----------+--------------+ PFV      Full                                                        +---------+---------------+---------+-----------+----------+--------------+  POP      None           No       No                   Acute          +---------+---------------+---------+-----------+----------+--------------+ PTV      None           No       No                    Acute          +---------+---------------+---------+-----------+----------+--------------+ PERO     None           No       No                   Acute          +---------+---------------+---------+-----------+----------+--------------+   +---------+---------------+---------+-----------+----------+--------------+ LEFT     CompressibilityPhasicitySpontaneityPropertiesThrombus Aging +---------+---------------+---------+-----------+----------+--------------+ CFV      Full           Yes      Yes                                 +---------+---------------+---------+-----------+----------+--------------+ SFJ      Full                                                        +---------+---------------+---------+-----------+----------+--------------+ FV Prox  Full                                                        +---------+---------------+---------+-----------+----------+--------------+ FV Mid   Full                                                        +---------+---------------+---------+-----------+----------+--------------+ FV DistalFull                                                        +---------+---------------+---------+-----------+----------+--------------+ PFV      Full                                                        +---------+---------------+---------+-----------+----------+--------------+ POP      Full           Yes      Yes                                 +---------+---------------+---------+-----------+----------+--------------+ PTV      Full                                                        +---------+---------------+---------+-----------+----------+--------------+  PERO     Full                                                        +---------+---------------+---------+-----------+----------+--------------+     Summary: BILATERAL: -No evidence of popliteal cyst, bilaterally. RIGHT: - Findings  consistent with acute deep vein thrombosis involving the right popliteal vein, right posterior tibial veins, and right peroneal veins.  LEFT: - No evidence of deep vein thrombosis in the lower extremity. No indirect evidence of obstruction proximal to the inguinal ligament.  *See table(s) above for measurements and observations. Electronically signed by Deitra Mayo MD on 09/21/2021 at 7:19:32 PM.    Final    DG Tibia/Fibula Right  Result Date: 09/21/2021 CLINICAL DATA:  88 year old female with right lower extremity pain after fall. EXAM: RIGHT TIBIA AND FIBULA - 2 VIEW COMPARISON:  None Available. FINDINGS: There is no evidence of fracture or other focal bone lesions. Soft tissues are unremarkable. IMPRESSION: No acute fracture or malalignment. Electronically Signed   By: Ruthann Cancer M.D.   On: 09/21/2021 11:22   CT Angio Chest PE W and/or Wo Contrast  Result Date: 09/20/2021 CLINICAL DATA:  Progressive weakness for 2 weeks, lower extremity edema EXAM: CT ANGIOGRAPHY CHEST WITH CONTRAST TECHNIQUE: Multidetector CT imaging of the chest was performed using the standard protocol during bolus administration of intravenous contrast. Multiplanar CT image reconstructions and MIPs were obtained to evaluate the vascular anatomy. RADIATION DOSE REDUCTION: This exam was performed according to the departmental dose-optimization program which includes automated exposure control, adjustment of the mA and/or kV according to patient size and/or use of iterative reconstruction technique. CONTRAST:  132m OMNIPAQUE IOHEXOL 350 MG/ML SOLN COMPARISON:  09/20/2021 FINDINGS: Cardiovascular: This is a technically adequate evaluation of the pulmonary vasculature. There is a large pulmonary embolus within the right main pulmonary artery, with complete occlusion of the right middle and right lower lobe pulmonary arteries as result. Central embolus is seen within the left main pulmonary artery, extending into the segmental  branches. There is a large clot burden, with evidence of right ventricular strain with RV/LV ratio measuring 1.48. There is significant right atrial dilation with reflux of contrast into the hepatic veins consistent with cardiac dysfunction. No pericardial effusion. Normal caliber of the thoracic aorta. Atherosclerosis of the aorta and coronary vasculature. Mediastinum/Nodes: No enlarged mediastinal, hilar, or axillary lymph nodes. Thyroid gland, trachea, and esophagus demonstrate no significant findings. Lungs/Pleura: Dense right middle and right lower lobe consolidation likely reflect pulmonary infarct given large central pulmonary emboli. Moderate right pleural effusion measures less than 1 L. No pneumothorax. Upper Abdomen: Calcified gallstones. No other acute upper abdominal findings. Musculoskeletal: No acute or destructive bony lesions. Reconstructed images demonstrate no additional findings. Review of the MIP images confirms the above findings. IMPRESSION: 1. Large central and segmental bilateral pulmonary emboli, most pronounced within the right main pulmonary artery as above. CT evidence of right heart strain (RV/LV Ratio = 1.48) consistent with at least submassive (intermediate risk) PE. The presence of right heart strain has been associated with an increased risk of morbidity and mortality. Please refer to the "Code PE Focused" order set in EPIC. 2. Dense right middle and right lower lobe consolidation most consistent with pulmonary infarct given central pulmonary emboli. 3. Moderate right pleural effusion measures less than 1 L. 4.  Aortic Atherosclerosis (ICD10-I70.0). 5. Cholelithiasis. Critical Value/emergent results were called by telephone at the time of interpretation on 09/20/2021 at 10:17 pm to provider DR [facility], who verbally acknowledged these results. Electronically Signed   By: Randa Ngo M.D.   On: 09/20/2021 22:19   CT HEAD WO CONTRAST (5MM)  Result Date: 09/20/2021 CLINICAL DATA:   Head trauma, minor (Age >= 65y); Neck trauma (Age >= 65y) EXAM: CT HEAD WITHOUT CONTRAST CT CERVICAL SPINE WITHOUT CONTRAST TECHNIQUE: Multidetector CT imaging of the head and cervical spine was performed following the standard protocol without intravenous contrast. Multiplanar CT image reconstructions of the cervical spine were also generated. RADIATION DOSE REDUCTION: This exam was performed according to the departmental dose-optimization program which includes automated exposure control, adjustment of the mA and/or kV according to patient size and/or use of iterative reconstruction technique. COMPARISON:  None Available. FINDINGS: CT HEAD FINDINGS BRAIN: BRAIN Cerebral ventricle sizes are concordant with the degree of cerebral volume loss. Patchy and confluent areas of decreased attenuation are noted throughout the deep and periventricular white matter of the cerebral hemispheres bilaterally, compatible with chronic microvascular ischemic disease. No evidence of large-territorial acute infarction. No parenchymal hemorrhage. No mass lesion. No extra-axial collection. No mass effect or midline shift. No hydrocephalus. Basilar cisterns are patent. Vascular: No hyperdense vessel. Skull: No acute fracture or focal lesion. Sinuses/Orbits: Paranasal sinuses and mastoid air cells are clear. Bilateral lens replacement. Otherwise The orbits are unremarkable. Other: None. CT CERVICAL SPINE FINDINGS Alignment: Normal. Skull base and vertebrae: Multilevel severe degenerative changes of the spine. No severe osseous neural foraminal or central canal stenosis. No acute fracture. No aggressive appearing focal osseous lesion or focal pathologic process. Soft tissues and spinal canal: No prevertebral fluid or swelling. No visible canal hematoma. Upper chest: Unremarkable. Other: Right pleural effusion. Please see separately dictated CT angiography 10/10/2021 chest for positive findings. Atherosclerotic plaque of the carotid  arteries within the neck. IMPRESSION: 1. No acute intracranial abnormality. 2. No acute displaced fracture or traumatic listhesis of the cervical spine. 3. Right pleural effusion. Please see separately dictated CT angiography 10/10/2021 chest for positive findings. Electronically Signed   By: Iven Finn M.D.   On: 09/20/2021 22:13   CT Cervical Spine Wo Contrast  Result Date: 09/20/2021 CLINICAL DATA:  Head trauma, minor (Age >= 65y); Neck trauma (Age >= 65y) EXAM: CT HEAD WITHOUT CONTRAST CT CERVICAL SPINE WITHOUT CONTRAST TECHNIQUE: Multidetector CT imaging of the head and cervical spine was performed following the standard protocol without intravenous contrast. Multiplanar CT image reconstructions of the cervical spine were also generated. RADIATION DOSE REDUCTION: This exam was performed according to the departmental dose-optimization program which includes automated exposure control, adjustment of the mA and/or kV according to patient size and/or use of iterative reconstruction technique. COMPARISON:  None Available. FINDINGS: CT HEAD FINDINGS BRAIN: BRAIN Cerebral ventricle sizes are concordant with the degree of cerebral volume loss. Patchy and confluent areas of decreased attenuation are noted throughout the deep and periventricular white matter of the cerebral hemispheres bilaterally, compatible with chronic microvascular ischemic disease. No evidence of large-territorial acute infarction. No parenchymal hemorrhage. No mass lesion. No extra-axial collection. No mass effect or midline shift. No hydrocephalus. Basilar cisterns are patent. Vascular: No hyperdense vessel. Skull: No acute fracture or focal lesion. Sinuses/Orbits: Paranasal sinuses and mastoid air cells are clear. Bilateral lens replacement. Otherwise The orbits are unremarkable. Other: None. CT CERVICAL SPINE FINDINGS Alignment: Normal. Skull base and vertebrae: Multilevel severe degenerative changes of the  spine. No severe osseous  neural foraminal or central canal stenosis. No acute fracture. No aggressive appearing focal osseous lesion or focal pathologic process. Soft tissues and spinal canal: No prevertebral fluid or swelling. No visible canal hematoma. Upper chest: Unremarkable. Other: Right pleural effusion. Please see separately dictated CT angiography 10/10/2021 chest for positive findings. Atherosclerotic plaque of the carotid arteries within the neck. IMPRESSION: 1. No acute intracranial abnormality. 2. No acute displaced fracture or traumatic listhesis of the cervical spine. 3. Right pleural effusion. Please see separately dictated CT angiography 10/10/2021 chest for positive findings. Electronically Signed   By: Iven Finn M.D.   On: 09/20/2021 22:13   DG Shoulder Left  Result Date: 09/20/2021 CLINICAL DATA:  Left shoulder pain EXAM: LEFT SHOULDER - 2+ VIEW COMPARISON:  August 02, 2021 FINDINGS: Diffuse osteopenia. No acute fracture or dislocation. Moderate glenohumeral and acromioclavicular joint degenerative changes. Decreased subacromial space, suggestive of chronic rotator cuff pathology. The visualized left lung is clear. IMPRESSION: 1. No acute fracture or dislocation. 2. Left glenohumeral and acromioclavicular degenerative changes, similar to prior. Electronically Signed   By: Beryle Flock M.D.   On: 09/20/2021 14:08   DG Chest Portable 1 View  Result Date: 09/20/2021 CLINICAL DATA:  Left shoulder pain, chest pain EXAM: PORTABLE CHEST 1 VIEW COMPARISON:  08/13/2021 FINDINGS: Right lower and middle lobe opacities. Central pulmonary vascular congestion. Small right pleural effusion is not excluded. No pneumothorax. Cardiomegaly. IMPRESSION: Central pulmonary vascular congestion with right lower and middle lobe opacities. Small right pleural effusion is not excluded. Mild cardiomegaly. May reflect pneumonia or edema. Electronically Signed   By: Macy Mis M.D.   On: 09/20/2021 14:05     The results of  significant diagnostics from this hospitalization (including imaging, microbiology, ancillary and laboratory) are listed below for reference.     Microbiology: No results found for this or any previous visit (from the past 240 hour(s)).   Labs: BNP (last 3 results) No results for input(s): "BNP" in the last 8760 hours. Basic Metabolic Panel: Recent Labs  Lab 09/20/21 1419 09/21/21 0603 09/22/21 0519 09/23/21 0357  NA 135 136 138 137  K 4.5 3.0* 3.4* 3.3*  CL 101 100 99 100  CO2 25 17* 19* 26  GLUCOSE 112* 140* 105* 133*  BUN 24* 19 21 39*  CREATININE 0.97 1.05* 1.09* 1.23*  CALCIUM 9.3 9.1 9.3 8.7*  MG 1.7 1.7 1.8  --   PHOS  --  4.1  --   --    Liver Function Tests: Recent Labs  Lab 09/20/21 1419  AST 24  ALT 21  ALKPHOS 141*  BILITOT 0.9  PROT 6.2*  ALBUMIN 2.8*   No results for input(s): "LIPASE", "AMYLASE" in the last 168 hours. No results for input(s): "AMMONIA" in the last 168 hours. CBC: Recent Labs  Lab 09/20/21 1419 09/21/21 0925 09/22/21 0519 09/23/21 0357  WBC 8.2 13.1* 13.5* 11.2*  NEUTROABS 6.3  --   --   --   HGB 13.6 13.6 15.3* 12.8  HCT 40.5 40.3 44.6 37.9  MCV 91.0 90.4 90.3 89.6  PLT 447* 451* 394 413*   Cardiac Enzymes: No results for input(s): "CKTOTAL", "CKMB", "CKMBINDEX", "TROPONINI" in the last 168 hours. BNP: Invalid input(s): "POCBNP" CBG: Recent Labs  Lab 09/21/21 0845  GLUCAP 128*   D-Dimer No results for input(s): "DDIMER" in the last 72 hours. Hgb A1c No results for input(s): "HGBA1C" in the last 72 hours. Lipid Profile No results for input(s): "CHOL", "  HDL", "LDLCALC", "TRIG", "CHOLHDL", "LDLDIRECT" in the last 72 hours. Thyroid function studies No results for input(s): "TSH", "T4TOTAL", "T3FREE", "THYROIDAB" in the last 72 hours.  Invalid input(s): "FREET3" Anemia work up No results for input(s): "VITAMINB12", "FOLATE", "FERRITIN", "TIBC", "IRON", "RETICCTPCT" in the last 72 hours. Urinalysis    Component  Value Date/Time   COLORURINE YELLOW 09/21/2021 0530   APPEARANCEUR CLEAR 09/21/2021 0530   LABSPEC 1.008 09/21/2021 0530   PHURINE 5.0 09/21/2021 0530   GLUCOSEU NEGATIVE 09/21/2021 0530   HGBUR NEGATIVE 09/21/2021 0530   BILIRUBINUR NEGATIVE 09/21/2021 0530   KETONESUR NEGATIVE 09/21/2021 0530   PROTEINUR NEGATIVE 09/21/2021 0530   NITRITE NEGATIVE 09/21/2021 0530   LEUKOCYTESUR SMALL (A) 09/21/2021 0530   Sepsis Labs Recent Labs  Lab 09/20/21 1419 09/21/21 0925 09/22/21 0519 09/23/21 0357  WBC 8.2 13.1* 13.5* 11.2*   Microbiology No results found for this or any previous visit (from the past 240 hour(s)).   Time coordinating discharge:  I have spent 35 minutes face to face with the patient and on the ward discussing the patients care, assessment, plan and disposition with other care givers. >50% of the time was devoted counseling the patient about the risks and benefits of treatment/Discharge disposition and coordinating care.   SIGNED:   Damita Lack, MD  [facility] 09/23/2021, 12:13 PM   If 7PM-7AM, please contact night-coverage

## 2021-09-23 NOTE — NC FL2 (Addendum)
[place] LEVEL OF CARE SCREENING TOOL     IDENTIFICATION  Patient Name: Judy [name] Birthdate: Jun 01, 1930 Sex: female Admission Date (Current Location): 09/20/2021  [facility] and [place] Number:  Herbalist and Address:  [facility] [place], [place] [address] [place], [place] [address]      Provider Number: [id number]  Attending Physician Name and Address:  Damita Lack, MD  Relative Name and Phone Number:       Current Level of Care: Hospital Recommended Level of Care: [place] Prior Approval Number:    Date Approved/Denied:   PASRR Number:    Discharge Plan: Other (Comment) (ALF)    Current Diagnoses: Patient Active Problem List   Diagnosis Date Noted   Paroxysmal atrial fibrillation with RVR ([place]) 09/21/2021   GERD (gastroesophageal reflux disease) 09/21/2021   Demand ischemia ([place]) 09/21/2021   Pulmonary emboli ([place]) 09/20/2021   New onset atrial fibrillation ([place]) 08/02/2021   Fall at home, initial encounter 08/02/2021   Rotator cuff tear arthropathy, right 08/02/2021   Chronic back pain 08/02/2021   DNR (do not resuscitate) 08/02/2021   Hypokalemia 09/07/2013   Cancer of breast, intraductal    HTN (hypertension)    Dyslipidemia    Breast cancer, left ([place]) 09/26/2011   DCIS (ductal carcinoma in situ) of breast 09/12/2011    Orientation RESPIRATION BLADDER Height & Weight     Self  Normal Incontinent Weight: 142 lb 3.2 oz (64.5 kg) Height:  '5\' 2"'$  (157.5 cm)  BEHAVIORAL SYMPTOMS/MOOD NEUROLOGICAL BOWEL NUTRITION STATUS      Continent Diet (No added salt)  AMBULATORY STATUS COMMUNICATION OF NEEDS Skin   Limited Assistance Verbally Normal                       Personal Care Assistance Level of Assistance  Bathing, Feeding, Dressing Bathing Assistance: Limited assistance Feeding assistance: Maximum assistance Dressing Assistance: Limited assistance     Functional Limitations Info              SPECIAL CARE FACTORS FREQUENCY  PT (By licensed PT), OT (By licensed OT)     PT Frequency: Home Health PT as per ALF OT Frequency: Home Health OT as per ALF            Contractures Contractures Info: Not present    Additional Factors Info  Code Status, Allergies Code Status Info: DNR Allergies Info: Amoxil (Amoxicillin), Codeine, Aricept (Donepezil Hcl), Catapres (Clonidine), Hycodan (Hydrocodone Bit-homatrop Mbr), Norco (Hydrocodone-acetaminophen), Norvasc (Amlodipine), Prolia (Denosumab), Ultram (Tramadol), Zoloft (Sertraline), Clavulanic Acid           Current Medications (09/23/2021):   Discharge Medications:  STOP taking these medications     cephALEXin 500 MG capsule Commonly known as: KEFLEX    ibuprofen 200 MG tablet Commonly known as: ADVIL    lisinopril 40 MG tablet Commonly known as: ZESTRIL           TAKE these medications     acetaminophen 500 MG tablet Commonly known as: TYLENOL Take 500-1,000 mg by mouth See admin instructions. 1000 mg every 12 hours + 500 mg every 6 hours PRN for fever 99.5-101, minor headache, minor discomfort.    apixaban 5 MG Tabs tablet Commonly known as: ELIQUIS Take 2 tablets (10 mg total) by mouth 2 (two) times daily for 6 days, THEN 1 tablet (5 mg total) 2 (two) times daily for 24 days. Start taking on: September 29, 2021    atorvastatin 10 MG tablet Commonly known as: LIPITOR Take 1 tablet (10 mg total) by mouth daily at 6 PM. What changed: when to take this    diltiazem 120 MG 24 hr capsule Commonly known as: Cardizem CD Take 1 capsule (120 mg total) by mouth daily.    hydrochlorothiazide 25 MG tablet Commonly known as: HYDRODIURIL Take 25 mg by mouth daily.    metoprolol succinate 50 MG 24 hr tablet Commonly known as: TOPROL-XL Take 3 tablets (150 mg total) by mouth daily. Take with or immediately following a meal. Start taking on: September 24, 2021 What changed:  medication strength how much to  take additional instructions    OMEGA-3 PO Take 1 capsule by mouth daily. Omega 3 - dha - epa - fish oil [phone] mg capsule    potassium chloride SA 20 MEQ tablet Commonly known as: KLOR-CON M Take 20 mEq by mouth daily.    saccharomyces boulardii 250 MG capsule Commonly known as: FLORASTOR Take 250 mg by mouth 2 (two) times daily.    Relevant Imaging Results:  Relevant Lab Results:   Additional Information SSN# [id number]  Benard Halsted, LCSW

## 2021-09-23 NOTE — Evaluation (Signed)
Physical Therapy Evaluation Patient Details Name: Judy [name] MRN: [id number] DOB: 1930/03/11 Today'[name] Date: 09/23/2021  History of Present Illness  88 yo female [facility] to [facility] from ALF on 8/7 for progressive weakness, BLE edema. CTA demonstrating bilat PE and LE [name] finds DVT of R popliteal vein, R posterior tibial veins, and R peroneal veins. Fall in ED on 8/7. Recent admission  07/2021 for dizziness and fall. PMH - breast CA, HTN, back pain, lt rotator cuff repair, back surgery, osteoporosis, dementia.  Clinical Impression   Pt presents with generalized weakness, impaired cognition with history of dementia, impaired balance with history of falls, and decreased activity tolerance. Pt to benefit from acute PT to address deficits. Pt ambulated hallway distance with use of RW, overall requiring min guard to light physical assist for mobility tasks. Pt is from ALF where she has assist with ADLs and mobility per pt report, appropriate to d/c back to ALF with [facility] services there. PT to progress mobility as tolerated, and will continue to follow acutely.         Recommendations for follow up therapy are one component of a multi-disciplinary discharge planning process, led by the attending physician.  Recommendations may be updated based on patient status, additional functional criteria and insurance authorization.  Follow Up Recommendations Home health PT (at ALF)      Assistance Recommended at Discharge Frequent or constant Supervision/Assistance  Patient can return home with the following  A little help with walking and/or transfers;A little help with bathing/dressing/bathroom;Assistance with cooking/housework;Direct supervision/assist for financial management;Direct supervision/assist for medications management;Assist for transportation;Help with stairs or ramp for entrance    Equipment Recommendations None recommended by PT  Recommendations for Other Services       Functional Status Assessment  Patient has had a recent decline in their functional status and demonstrates the ability to make significant improvements in function in a reasonable and predictable amount of time.     Precautions / Restrictions Precautions Precautions: Fall Restrictions Weight Bearing Restrictions: No      Mobility  Bed Mobility Overal bed mobility: Needs Assistance Bed Mobility: Supine to Sit, Sit to Supine     Supine to sit: Min assist Sit to supine: Min assist   General bed mobility comments: light assist for LE translation into and out of bed, pt using bedrails to perform    Transfers Overall transfer level: Needs assistance Equipment used: Rolling walker (2 wheels) Transfers: Sit to/from Stand Sit to Stand: Min assist           General transfer comment: light steadying assist upon standing, cues for hand placement when rising    Ambulation/Gait Ambulation/Gait assistance: Min guard Gait Distance (Feet): 70 Feet Assistive device: Rolling walker (2 wheels) Gait Pattern/deviations: Step-through pattern, Decreased stride length, Trunk flexed Gait velocity: decr     General Gait Details: cues for upright posture, hallway navigation, and navigating back to room  Stairs            Wheelchair Mobility    Modified Rankin (Stroke Patients Only)       Balance Overall balance assessment: Needs assistance Sitting-balance support: No upper extremity supported, Feet supported Sitting balance-Leahy Scale: Fair     Standing balance support: Bilateral upper extremity supported, During functional activity, Reliant on assistive device for balance Standing balance-Leahy Scale: Poor  Pertinent Vitals/Pain Pain Assessment Pain Assessment: Faces Faces Pain Scale: No hurt    Home Living Family/patient expects to be discharged to:: Assisted living Living Arrangements: Alone Available Help at Discharge: Family;Available  PRN/intermittently Type of Home: Assisted living           Home Equipment: Rolling Walker (2 wheels)      Prior Function Prior Level of Function : History of Falls (last six months);Needs assist             Mobility Comments: pt states she uses RW for gait ADLs Comments: pt states she gets help with dressing and bathing self, states staff at ALF "clean me up"     Hand Dominance   Dominant Hand: Right    Extremity/Trunk Assessment   Upper Extremity Assessment Upper Extremity Assessment: Defer to OT evaluation    Lower Extremity Assessment Lower Extremity Assessment: Generalized weakness    Cervical / Trunk Assessment Cervical / Trunk Assessment: Normal  Communication   Communication: HOH  Cognition Arousal/Alertness: Awake/alert Behavior During Therapy: WFL for tasks assessed/performed Overall Cognitive Status: History of cognitive impairments - at baseline                                 General Comments: pt initially stating she is at the hospital, then stating "when will this airplane stop going up?" and attempting to reorient pt increased distress. pt follows one step commands well, is pleasant during session        General Comments General comments (skin integrity, edema, etc.): no dyspnea on exertion    Exercises     Assessment/Plan    PT Assessment Patient needs continued PT services  PT Problem List Decreased strength;Decreased mobility;Decreased safety awareness;Decreased activity tolerance;Decreased cognition;Decreased balance       PT Treatment Interventions DME instruction;Therapeutic activities;Gait training;Therapeutic exercise;Patient/family education;Balance training;Functional mobility training;Neuromuscular re-education    PT Goals (Current goals can be found in the Care Plan section)  Acute Rehab PT Goals Patient Stated Goal: go home PT Goal Formulation: With patient Time For Goal Achievement: 10/07/21 Potential to  Achieve Goals: Good    Frequency Min 3X/week     Co-evaluation               AM-PAC PT "6 Clicks" Mobility  Outcome Measure Help needed turning from your back to your side while in a flat bed without using bedrails?: A Little Help needed moving from lying on your back to sitting on the side of a flat bed without using bedrails?: A Little Help needed moving to and from a bed to a chair (including a wheelchair)?: A Little Help needed standing up from a chair using your arms (e.g., wheelchair or bedside chair)?: A Little Help needed to walk in hospital room?: A Little Help needed climbing 3-5 steps with a railing? : A Lot 6 Click Score: 17    End of Session Equipment Utilized During Treatment: Gait belt Activity Tolerance: Patient tolerated treatment well Patient left: in bed;with call bell/phone within reach;with bed alarm set;Other (comment) (fall pads placed beside bed bilat) Nurse Communication: Mobility status PT Visit Diagnosis: Other abnormalities of gait and mobility (R26.89);Muscle weakness (generalized) (M62.81)    Time: 5427-0623 PT Time Calculation (min) (ACUTE ONLY): 24 min   Charges:   PT Evaluation $PT Eval Low Complexity: 1 Low PT Treatments $Therapeutic Activity: 8-22 mins       Judy [name], PT DPT Acute Rehabilitation  Services Pager [phone]  Office [phone]   Louis Matte 09/23/2021, 10:48 AM

## 2021-09-27 DIAGNOSIS — I4891 Unspecified atrial fibrillation: Secondary | ICD-10-CM | POA: Diagnosis not present

## 2021-09-27 DIAGNOSIS — I10 Essential (primary) hypertension: Secondary | ICD-10-CM | POA: Diagnosis not present

## 2021-09-27 DIAGNOSIS — Z9181 History of falling: Secondary | ICD-10-CM | POA: Diagnosis not present

## 2021-09-27 DIAGNOSIS — M542 Cervicalgia: Secondary | ICD-10-CM | POA: Diagnosis not present

## 2021-09-27 DIAGNOSIS — Z853 Personal history of malignant neoplasm of breast: Secondary | ICD-10-CM | POA: Diagnosis not present

## 2021-09-27 DIAGNOSIS — F01B4 Vascular dementia, moderate, with anxiety: Secondary | ICD-10-CM | POA: Diagnosis not present

## 2021-09-27 DIAGNOSIS — N390 Urinary tract infection, site not specified: Secondary | ICD-10-CM | POA: Diagnosis not present

## 2021-09-27 DIAGNOSIS — M25512 Pain in left shoulder: Secondary | ICD-10-CM | POA: Diagnosis not present

## 2021-09-27 DIAGNOSIS — F03918 Unspecified dementia, unspecified severity, with other behavioral disturbance: Secondary | ICD-10-CM | POA: Diagnosis not present

## 2021-09-27 DIAGNOSIS — I480 Paroxysmal atrial fibrillation: Secondary | ICD-10-CM | POA: Diagnosis not present

## 2021-09-27 DIAGNOSIS — E785 Hyperlipidemia, unspecified: Secondary | ICD-10-CM | POA: Diagnosis not present

## 2021-09-27 DIAGNOSIS — I2692 Saddle embolus of pulmonary artery without acute cor pulmonale: Secondary | ICD-10-CM | POA: Diagnosis not present

## 2021-09-27 DIAGNOSIS — E860 Dehydration: Secondary | ICD-10-CM | POA: Diagnosis not present

## 2021-09-27 DIAGNOSIS — M4800 Spinal stenosis, site unspecified: Secondary | ICD-10-CM | POA: Diagnosis not present

## 2021-09-28 DIAGNOSIS — M25512 Pain in left shoulder: Secondary | ICD-10-CM | POA: Diagnosis not present

## 2021-09-28 DIAGNOSIS — I10 Essential (primary) hypertension: Secondary | ICD-10-CM | POA: Diagnosis not present

## 2021-09-28 DIAGNOSIS — Z853 Personal history of malignant neoplasm of breast: Secondary | ICD-10-CM | POA: Diagnosis not present

## 2021-09-28 DIAGNOSIS — Z9181 History of falling: Secondary | ICD-10-CM | POA: Diagnosis not present

## 2021-09-28 DIAGNOSIS — E785 Hyperlipidemia, unspecified: Secondary | ICD-10-CM | POA: Diagnosis not present

## 2021-09-28 DIAGNOSIS — E860 Dehydration: Secondary | ICD-10-CM | POA: Diagnosis not present

## 2021-09-28 DIAGNOSIS — M4800 Spinal stenosis, site unspecified: Secondary | ICD-10-CM | POA: Diagnosis not present

## 2021-09-28 DIAGNOSIS — I4891 Unspecified atrial fibrillation: Secondary | ICD-10-CM | POA: Diagnosis not present

## 2021-09-28 DIAGNOSIS — M542 Cervicalgia: Secondary | ICD-10-CM | POA: Diagnosis not present

## 2021-09-28 DIAGNOSIS — F03918 Unspecified dementia, unspecified severity, with other behavioral disturbance: Secondary | ICD-10-CM | POA: Diagnosis not present

## 2021-09-28 DIAGNOSIS — N390 Urinary tract infection, site not specified: Secondary | ICD-10-CM | POA: Diagnosis not present

## 2021-09-29 DIAGNOSIS — E876 Hypokalemia: Secondary | ICD-10-CM | POA: Diagnosis not present

## 2021-09-30 DIAGNOSIS — M25552 Pain in left hip: Secondary | ICD-10-CM | POA: Diagnosis not present

## 2021-10-04 DIAGNOSIS — R609 Edema, unspecified: Secondary | ICD-10-CM | POA: Diagnosis not present

## 2021-10-04 DIAGNOSIS — I2692 Saddle embolus of pulmonary artery without acute cor pulmonale: Secondary | ICD-10-CM | POA: Diagnosis not present

## 2021-10-04 DIAGNOSIS — I10 Essential (primary) hypertension: Secondary | ICD-10-CM | POA: Diagnosis not present

## 2021-10-04 DIAGNOSIS — F01B4 Vascular dementia, moderate, with anxiety: Secondary | ICD-10-CM | POA: Diagnosis not present

## 2021-10-04 DIAGNOSIS — Z66 Do not resuscitate: Secondary | ICD-10-CM | POA: Diagnosis not present

## 2021-10-04 DIAGNOSIS — M25552 Pain in left hip: Secondary | ICD-10-CM | POA: Diagnosis not present

## 2021-10-05 DIAGNOSIS — M4800 Spinal stenosis, site unspecified: Secondary | ICD-10-CM | POA: Diagnosis not present

## 2021-10-05 DIAGNOSIS — M542 Cervicalgia: Secondary | ICD-10-CM | POA: Diagnosis not present

## 2021-10-05 DIAGNOSIS — N390 Urinary tract infection, site not specified: Secondary | ICD-10-CM | POA: Diagnosis not present

## 2021-10-05 DIAGNOSIS — Z9181 History of falling: Secondary | ICD-10-CM | POA: Diagnosis not present

## 2021-10-05 DIAGNOSIS — M25512 Pain in left shoulder: Secondary | ICD-10-CM | POA: Diagnosis not present

## 2021-10-05 DIAGNOSIS — E860 Dehydration: Secondary | ICD-10-CM | POA: Diagnosis not present

## 2021-10-05 DIAGNOSIS — I4891 Unspecified atrial fibrillation: Secondary | ICD-10-CM | POA: Diagnosis not present

## 2021-10-05 DIAGNOSIS — I10 Essential (primary) hypertension: Secondary | ICD-10-CM | POA: Diagnosis not present

## 2021-10-05 DIAGNOSIS — E785 Hyperlipidemia, unspecified: Secondary | ICD-10-CM | POA: Diagnosis not present

## 2021-10-05 DIAGNOSIS — Z853 Personal history of malignant neoplasm of breast: Secondary | ICD-10-CM | POA: Diagnosis not present

## 2021-10-05 DIAGNOSIS — F03918 Unspecified dementia, unspecified severity, with other behavioral disturbance: Secondary | ICD-10-CM | POA: Diagnosis not present

## 2021-10-06 DIAGNOSIS — I10 Essential (primary) hypertension: Secondary | ICD-10-CM | POA: Diagnosis not present

## 2021-10-07 DIAGNOSIS — E860 Dehydration: Secondary | ICD-10-CM | POA: Diagnosis not present

## 2021-10-07 DIAGNOSIS — M542 Cervicalgia: Secondary | ICD-10-CM | POA: Diagnosis not present

## 2021-10-07 DIAGNOSIS — M25512 Pain in left shoulder: Secondary | ICD-10-CM | POA: Diagnosis not present

## 2021-10-07 DIAGNOSIS — N390 Urinary tract infection, site not specified: Secondary | ICD-10-CM | POA: Diagnosis not present

## 2021-10-07 DIAGNOSIS — I4891 Unspecified atrial fibrillation: Secondary | ICD-10-CM | POA: Diagnosis not present

## 2021-10-07 DIAGNOSIS — F03918 Unspecified dementia, unspecified severity, with other behavioral disturbance: Secondary | ICD-10-CM | POA: Diagnosis not present

## 2021-10-07 DIAGNOSIS — Z9181 History of falling: Secondary | ICD-10-CM | POA: Diagnosis not present

## 2021-10-07 DIAGNOSIS — Z853 Personal history of malignant neoplasm of breast: Secondary | ICD-10-CM | POA: Diagnosis not present

## 2021-10-07 DIAGNOSIS — E785 Hyperlipidemia, unspecified: Secondary | ICD-10-CM | POA: Diagnosis not present

## 2021-10-07 DIAGNOSIS — M4800 Spinal stenosis, site unspecified: Secondary | ICD-10-CM | POA: Diagnosis not present

## 2021-10-07 DIAGNOSIS — I10 Essential (primary) hypertension: Secondary | ICD-10-CM | POA: Diagnosis not present

## 2021-10-08 ENCOUNTER — Emergency Department (HOSPITAL_COMMUNITY): Payer: Medicare Other

## 2021-10-08 ENCOUNTER — Other Ambulatory Visit: Payer: Self-pay

## 2021-10-08 ENCOUNTER — Inpatient Hospital Stay (HOSPITAL_COMMUNITY)
Admission: EM | Admit: 2021-10-08 | Discharge: 2021-10-12 | DRG: 308 | Disposition: A | Discharge status: Skilled Nursing Facility | Payer: Medicare Other | Source: Skilled Nursing Facility | Attending: Internal Medicine | Admitting: Internal Medicine

## 2021-10-08 DIAGNOSIS — Z885 Allergy status to narcotic agent status: Secondary | ICD-10-CM

## 2021-10-08 DIAGNOSIS — Z853 Personal history of malignant neoplasm of breast: Secondary | ICD-10-CM | POA: Diagnosis not present

## 2021-10-08 DIAGNOSIS — Z781 Physical restraint status: Secondary | ICD-10-CM

## 2021-10-08 DIAGNOSIS — R627 Adult failure to thrive: Secondary | ICD-10-CM | POA: Diagnosis not present

## 2021-10-08 DIAGNOSIS — M545 Low back pain, unspecified: Secondary | ICD-10-CM | POA: Diagnosis present

## 2021-10-08 DIAGNOSIS — W1830XA Fall on same level, unspecified, initial encounter: Secondary | ICD-10-CM | POA: Diagnosis present

## 2021-10-08 DIAGNOSIS — Z880 Allergy status to penicillin: Secondary | ICD-10-CM

## 2021-10-08 DIAGNOSIS — Z888 Allergy status to other drugs, medicaments and biological substances status: Secondary | ICD-10-CM | POA: Diagnosis not present

## 2021-10-08 DIAGNOSIS — Z7189 Other specified counseling: Secondary | ICD-10-CM | POA: Diagnosis not present

## 2021-10-08 DIAGNOSIS — Z66 Do not resuscitate: Secondary | ICD-10-CM | POA: Diagnosis present

## 2021-10-08 DIAGNOSIS — W19XXXA Unspecified fall, initial encounter: Principal | ICD-10-CM

## 2021-10-08 DIAGNOSIS — S299XXA Unspecified injury of thorax, initial encounter: Secondary | ICD-10-CM | POA: Diagnosis not present

## 2021-10-08 DIAGNOSIS — J90 Pleural effusion, not elsewhere classified: Secondary | ICD-10-CM | POA: Diagnosis not present

## 2021-10-08 DIAGNOSIS — Z515 Encounter for palliative care: Secondary | ICD-10-CM | POA: Diagnosis not present

## 2021-10-08 DIAGNOSIS — F05 Delirium due to known physiological condition: Secondary | ICD-10-CM | POA: Diagnosis present

## 2021-10-08 DIAGNOSIS — Z79899 Other long term (current) drug therapy: Secondary | ICD-10-CM

## 2021-10-08 DIAGNOSIS — K219 Gastro-esophageal reflux disease without esophagitis: Secondary | ICD-10-CM | POA: Diagnosis present

## 2021-10-08 DIAGNOSIS — E785 Hyperlipidemia, unspecified: Secondary | ICD-10-CM | POA: Diagnosis present

## 2021-10-08 DIAGNOSIS — Z9012 Acquired absence of left breast and nipple: Secondary | ICD-10-CM

## 2021-10-08 DIAGNOSIS — N200 Calculus of kidney: Secondary | ICD-10-CM | POA: Diagnosis not present

## 2021-10-08 DIAGNOSIS — M810 Age-related osteoporosis without current pathological fracture: Secondary | ICD-10-CM | POA: Diagnosis present

## 2021-10-08 DIAGNOSIS — I5022 Chronic systolic (congestive) heart failure: Secondary | ICD-10-CM | POA: Diagnosis present

## 2021-10-08 DIAGNOSIS — S0181XA Laceration without foreign body of other part of head, initial encounter: Secondary | ICD-10-CM | POA: Diagnosis not present

## 2021-10-08 DIAGNOSIS — Z7401 Bed confinement status: Secondary | ICD-10-CM | POA: Diagnosis not present

## 2021-10-08 DIAGNOSIS — N390 Urinary tract infection, site not specified: Secondary | ICD-10-CM | POA: Diagnosis not present

## 2021-10-08 DIAGNOSIS — E876 Hypokalemia: Secondary | ICD-10-CM | POA: Diagnosis not present

## 2021-10-08 DIAGNOSIS — R531 Weakness: Secondary | ICD-10-CM | POA: Diagnosis not present

## 2021-10-08 DIAGNOSIS — F03911 Unspecified dementia, unspecified severity, with agitation: Secondary | ICD-10-CM | POA: Diagnosis not present

## 2021-10-08 DIAGNOSIS — F0390 Unspecified dementia without behavioral disturbance: Secondary | ICD-10-CM | POA: Diagnosis present

## 2021-10-08 DIAGNOSIS — I5020 Unspecified systolic (congestive) heart failure: Secondary | ICD-10-CM | POA: Diagnosis not present

## 2021-10-08 DIAGNOSIS — I4891 Unspecified atrial fibrillation: Secondary | ICD-10-CM | POA: Diagnosis not present

## 2021-10-08 DIAGNOSIS — M7989 Other specified soft tissue disorders: Secondary | ICD-10-CM | POA: Diagnosis not present

## 2021-10-08 DIAGNOSIS — G8929 Other chronic pain: Secondary | ICD-10-CM | POA: Diagnosis present

## 2021-10-08 DIAGNOSIS — Z803 Family history of malignant neoplasm of breast: Secondary | ICD-10-CM

## 2021-10-08 DIAGNOSIS — D649 Anemia, unspecified: Secondary | ICD-10-CM | POA: Diagnosis present

## 2021-10-08 DIAGNOSIS — M4312 Spondylolisthesis, cervical region: Secondary | ICD-10-CM | POA: Diagnosis not present

## 2021-10-08 DIAGNOSIS — Z6821 Body mass index (BMI) 21.0-21.9, adult: Secondary | ICD-10-CM | POA: Diagnosis not present

## 2021-10-08 DIAGNOSIS — G9341 Metabolic encephalopathy: Secondary | ICD-10-CM | POA: Diagnosis present

## 2021-10-08 DIAGNOSIS — I110 Hypertensive heart disease with heart failure: Secondary | ICD-10-CM | POA: Diagnosis present

## 2021-10-08 DIAGNOSIS — K5730 Diverticulosis of large intestine without perforation or abscess without bleeding: Secondary | ICD-10-CM | POA: Diagnosis not present

## 2021-10-08 DIAGNOSIS — Z7901 Long term (current) use of anticoagulants: Secondary | ICD-10-CM

## 2021-10-08 DIAGNOSIS — I2699 Other pulmonary embolism without acute cor pulmonale: Secondary | ICD-10-CM | POA: Diagnosis present

## 2021-10-08 DIAGNOSIS — Z86718 Personal history of other venous thrombosis and embolism: Secondary | ICD-10-CM | POA: Diagnosis not present

## 2021-10-08 DIAGNOSIS — Z86711 Personal history of pulmonary embolism: Secondary | ICD-10-CM

## 2021-10-08 DIAGNOSIS — I480 Paroxysmal atrial fibrillation: Principal | ICD-10-CM | POA: Diagnosis present

## 2021-10-08 DIAGNOSIS — R296 Repeated falls: Secondary | ICD-10-CM | POA: Diagnosis present

## 2021-10-08 DIAGNOSIS — K8689 Other specified diseases of pancreas: Secondary | ICD-10-CM | POA: Diagnosis not present

## 2021-10-08 DIAGNOSIS — Z743 Need for continuous supervision: Secondary | ICD-10-CM | POA: Diagnosis not present

## 2021-10-08 DIAGNOSIS — R519 Headache, unspecified: Secondary | ICD-10-CM | POA: Diagnosis not present

## 2021-10-08 DIAGNOSIS — E860 Dehydration: Secondary | ICD-10-CM | POA: Diagnosis not present

## 2021-10-08 DIAGNOSIS — M25551 Pain in right hip: Secondary | ICD-10-CM | POA: Diagnosis not present

## 2021-10-08 DIAGNOSIS — R6889 Other general symptoms and signs: Secondary | ICD-10-CM | POA: Diagnosis not present

## 2021-10-08 DIAGNOSIS — S3991XA Unspecified injury of abdomen, initial encounter: Secondary | ICD-10-CM | POA: Diagnosis not present

## 2021-10-08 DIAGNOSIS — S0993XA Unspecified injury of face, initial encounter: Secondary | ICD-10-CM | POA: Diagnosis not present

## 2021-10-08 DIAGNOSIS — J9811 Atelectasis: Secondary | ICD-10-CM | POA: Diagnosis not present

## 2021-10-08 DIAGNOSIS — S0990XA Unspecified injury of head, initial encounter: Secondary | ICD-10-CM | POA: Diagnosis not present

## 2021-10-08 LAB — BASIC METABOLIC PANEL
Anion gap: 9 (ref 5–15)
BUN: 11 mg/dL (ref 8–23)
CO2: 28 mmol/L (ref 22–32)
Calcium: 8.7 mg/dL — ABNORMAL LOW (ref 8.9–10.3)
Chloride: 99 mmol/L (ref 98–111)
Creatinine, Ser: 0.69 mg/dL (ref 0.44–1.00)
GFR, Estimated: >60 mL/min (ref >60)
Glucose, Bld: 109 mg/dL — ABNORMAL HIGH (ref 70–99)
Potassium: 2.8 mmol/L — ABNORMAL LOW (ref 3.5–5.1)
Sodium: 136 mmol/L (ref 135–145)

## 2021-10-08 LAB — PROTIME-INR
INR: 1.9 — ABNORMAL HIGH (ref 0.8–1.2)
Prothrombin Time: 21.9 seconds — ABNORMAL HIGH (ref 11.4–15.2)

## 2021-10-08 LAB — POTASSIUM: Potassium: 3 mmol/L — ABNORMAL LOW (ref 3.5–5.1)

## 2021-10-08 LAB — CBC
HCT: 32.2 % — ABNORMAL LOW (ref 36.0–46.0)
Hemoglobin: 10.5 g/dL — ABNORMAL LOW (ref 12.0–15.0)
MCH: 30.6 pg (ref 26.0–34.0)
MCHC: 32.6 g/dL (ref 30.0–36.0)
MCV: 93.9 fL (ref 80.0–100.0)
Platelets: 434 10*3/uL — ABNORMAL HIGH (ref 150–400)
RBC: 3.43 MIL/uL — ABNORMAL LOW (ref 3.87–5.11)
RDW: 17.5 % — ABNORMAL HIGH (ref 11.5–15.5)
WBC: 8 10*3/uL (ref 4.0–10.5)
nRBC: 0 % (ref 0.0–0.2)

## 2021-10-08 LAB — HEPARIN LEVEL (UNFRACTIONATED): Heparin Unfractionated: >1.1 IU/mL — ABNORMAL HIGH (ref 0.30–0.70)

## 2021-10-08 LAB — MRSA NEXT GEN BY PCR, NASAL: MRSA by PCR Next Gen: NOT DETECTED

## 2021-10-08 LAB — MAGNESIUM: Magnesium: 1.3 mg/dL — ABNORMAL LOW (ref 1.7–2.4)

## 2021-10-08 LAB — APTT: aPTT: 72 seconds — ABNORMAL HIGH (ref 24–36)

## 2021-10-08 LAB — PHOSPHORUS: Phosphorus: 2.7 mg/dL (ref 2.5–4.6)

## 2021-10-08 MED ORDER — METOPROLOL TARTRATE 5 MG/5ML IV SOLN
5.0000 mg | Freq: Four times a day (QID) | INTRAVENOUS | Status: DC | PRN
Start: 2021-10-08 — End: 2021-10-10
  Administered 2021-10-08: 5 mg via INTRAVENOUS
  Filled 2021-10-08: qty 5

## 2021-10-08 MED ORDER — ATORVASTATIN CALCIUM 10 MG PO TABS
10.0000 mg | ORAL_TABLET | Freq: Every day | ORAL | Status: DC
  Administered 2021-10-08 – 2021-10-11 (×4): 10 mg via ORAL
  Filled 2021-10-08 (×4): qty 1

## 2021-10-08 MED ORDER — HEPARIN (PORCINE) 25000 UT/250ML-% IV SOLN
1000.0000 [IU]/h | INTRAVENOUS | Status: DC
  Administered 2021-10-08: 1000 [IU]/h via INTRAVENOUS
  Filled 2021-10-08: qty 250

## 2021-10-08 MED ORDER — DIGOXIN 0.25 MG/ML IJ SOLN
0.1250 mg | Freq: Four times a day (QID) | INTRAMUSCULAR | Status: DC
Start: 2021-10-08 — End: 2021-10-08

## 2021-10-08 MED ORDER — MAGNESIUM SULFATE 4 GM/100ML IV SOLN
4.0000 g | Freq: Once | INTRAVENOUS | Status: AC
  Administered 2021-10-09: 4 g via INTRAVENOUS
  Filled 2021-10-08: qty 100

## 2021-10-08 MED ORDER — DIGOXIN 0.25 MG/ML IJ SOLN: 0.1250 mg | Freq: Four times a day (QID) | INTRAMUSCULAR | Status: DC

## 2021-10-08 MED ORDER — SODIUM CHLORIDE 0.9% FLUSH: 3.0000 mL | INTRAVENOUS | Status: DC | PRN

## 2021-10-08 MED ORDER — POTASSIUM CHLORIDE 10 MEQ/100ML IV SOLN
10.0000 meq | INTRAVENOUS | Status: AC
  Administered 2021-10-09 (×4): 10 meq via INTRAVENOUS
  Filled 2021-10-08 (×4): qty 100

## 2021-10-08 MED ORDER — HALOPERIDOL LACTATE 5 MG/ML IJ SOLN: 2.0000 mg | Freq: Four times a day (QID) | INTRAMUSCULAR | Status: DC | PRN

## 2021-10-08 MED ORDER — IOHEXOL 300 MG/ML  SOLN
75.0000 mL | Freq: Once | INTRAMUSCULAR | Status: AC | PRN
  Administered 2021-10-08: 75 mL via INTRAVENOUS

## 2021-10-08 MED ORDER — POTASSIUM CHLORIDE 10 MEQ/100ML IV SOLN
10.0000 meq | Freq: Once | INTRAVENOUS | Status: AC
  Administered 2021-10-08: 10 meq via INTRAVENOUS
  Filled 2021-10-08: qty 100

## 2021-10-08 MED ORDER — ACETAMINOPHEN 500 MG PO TABS: 500.0000 mg | ORAL_TABLET | ORAL | Status: DC

## 2021-10-08 MED ORDER — ACETAMINOPHEN 500 MG PO TABS
1000.0000 mg | ORAL_TABLET | Freq: Two times a day (BID) | ORAL | Status: DC
  Administered 2021-10-08 – 2021-10-12 (×6): 1000 mg via ORAL
  Filled 2021-10-08 (×8): qty 2

## 2021-10-08 MED ORDER — LIDOCAINE-EPINEPHRINE (PF) 2 %-1:200000 IJ SOLN
10.0000 mL | Freq: Once | INTRAMUSCULAR | Status: AC
  Administered 2021-10-08: 10 mL
  Filled 2021-10-08: qty 20

## 2021-10-08 MED ORDER — LISINOPRIL 20 MG PO TABS: 20.0000 mg | ORAL_TABLET | Freq: Every day | ORAL | Status: DC

## 2021-10-08 MED ORDER — SACCHAROMYCES BOULARDII 250 MG PO CAPS
250.0000 mg | ORAL_CAPSULE | Freq: Two times a day (BID) | ORAL | Status: DC
Start: 2021-10-08 — End: 2021-10-08

## 2021-10-08 MED ORDER — METOPROLOL TARTRATE 50 MG PO TABS
100.0000 mg | ORAL_TABLET | Freq: Two times a day (BID) | ORAL | Status: DC
  Administered 2021-10-08 – 2021-10-12 (×5): 100 mg via ORAL
  Filled 2021-10-08: qty 4
  Filled 2021-10-08 (×6): qty 2

## 2021-10-08 MED ORDER — QUETIAPINE FUMARATE 50 MG PO TABS
25.0000 mg | ORAL_TABLET | Freq: Every day | ORAL | Status: DC
Start: 2021-10-08 — End: 2021-10-13
  Administered 2021-10-08 – 2021-10-11 (×4): 25 mg via ORAL
  Filled 2021-10-08 (×4): qty 1

## 2021-10-08 MED ORDER — HYDROMORPHONE HCL 1 MG/ML IJ SOLN
0.2500 mg | INTRAMUSCULAR | Status: DC | PRN
  Administered 2021-10-08: 0.25 mg via INTRAVENOUS
  Filled 2021-10-08: qty 1

## 2021-10-08 MED ORDER — POTASSIUM CHLORIDE CRYS ER 20 MEQ PO TBCR
40.0000 meq | EXTENDED_RELEASE_TABLET | ORAL | Status: AC
  Filled 2021-10-08: qty 2

## 2021-10-08 MED ORDER — SODIUM CHLORIDE 0.9% FLUSH
3.0000 mL | Freq: Two times a day (BID) | INTRAVENOUS | Status: DC
Start: 2021-10-08 — End: 2021-10-13
  Administered 2021-10-09 – 2021-10-12 (×6): 3 mL via INTRAVENOUS

## 2021-10-08 MED ORDER — ACETAMINOPHEN 500 MG PO TABS: 500.0000 mg | ORAL_TABLET | Freq: Four times a day (QID) | ORAL | Status: DC | PRN

## 2021-10-08 MED ORDER — ENALAPRILAT 1.25 MG/ML IV SOLN
0.6250 mg | Freq: Four times a day (QID) | INTRAVENOUS | Status: DC | PRN
  Administered 2021-10-09: 0.625 mg via INTRAVENOUS
  Filled 2021-10-08 (×2): qty 0.5

## 2021-10-08 MED ORDER — POTASSIUM CHLORIDE CRYS ER 20 MEQ PO TBCR
40.0000 meq | EXTENDED_RELEASE_TABLET | Freq: Once | ORAL | Status: AC
  Administered 2021-10-09: 40 meq via ORAL
  Filled 2021-10-08: qty 2

## 2021-10-08 MED ORDER — RISAQUAD PO CAPS
1.0000 | ORAL_CAPSULE | Freq: Every day | ORAL | Status: DC
  Administered 2021-10-09 – 2021-10-12 (×2): 1 via ORAL
  Filled 2021-10-08 (×5): qty 1

## 2021-10-08 MED ORDER — POTASSIUM CHLORIDE 10 MEQ/100ML IV SOLN
10.0000 meq | INTRAVENOUS | Status: AC
  Administered 2021-10-08 (×2): 10 meq via INTRAVENOUS
  Filled 2021-10-08 (×2): qty 100

## 2021-10-08 MED ORDER — SODIUM CHLORIDE 0.9 % IV SOLN: 250.0000 mL | INTRAVENOUS | Status: DC | PRN

## 2021-10-08 MED ORDER — HALOPERIDOL LACTATE 5 MG/ML IJ SOLN
2.0000 mg | Freq: Once | INTRAMUSCULAR | Status: AC
  Administered 2021-10-08: 2 mg via INTRAVENOUS
  Filled 2021-10-08: qty 1

## 2021-10-08 MED ORDER — ONDANSETRON HCL 4 MG/2ML IJ SOLN: 4.0000 mg | Freq: Four times a day (QID) | INTRAMUSCULAR | Status: DC | PRN

## 2021-10-08 NOTE — Progress Notes (Addendum)
HOSPITAL MEDICINE OVERNIGHT EVENT NOTE    Notified by pharmacy that patient continues to exhibit substantial electrolyte derangements including a magnesium of 1.3 and potassium of 3.0.  This is likely contributing to patient's poor rate control with patient continuing to be in atrial fibrillation with heart rate in the 110's.    Administering 2 g of intravenous magnesium sulfate as well as 40 mill equivalents of intravenous potassium and 40 mill equivalents of oral potassium.  Due to persisting hypokalemia, will hold off on originally intended digoxin loading for now.  Admitting provider seems to have placed the patient on metoprolol tartrate 100 mg twice daily.  As we are correcting the electrolytes we will see how patient's heart rate response to this increased regimen of metoprolol.  We will follow-up on electrolytes in the morning and follow rate control and will add additional AV nodal blocking agents as necessary.  Vernelle Emerald  MD [facility]

## 2021-10-08 NOTE — ED Notes (Signed)
Pt complains of pain all over. HR is in 130's

## 2021-10-08 NOTE — H&P (Signed)
History and Physical    UVA RUNKEL IWP:[id number] DOB: 10-02-1930 DOA: 10/08/2021  PCP: Mayra Neer, MD (Confirm with patient/family/NH records and if not entered, this has to be entered at [facility] point of entry) Patient coming from: SNF  I have personally briefly reviewed patient's old medical records in [place]  Chief Complaint: Palpitations  HPI: Judy [name] is a 88 y.o. female with medical history significant of recently diagnosed acute bilateral segmental PE and acute right popliteal DVT on Eliquis, PAF, HTN, chronic HFrEF, sent from nursing home for mechanical fall.  Patient has chronic ambulatory dysfunction and frequent falls in the past.  Today, while making bed for herself, patient started to feel lightheaded and fell.  She hit her right forehead.  Denies any LOC.  No chest pain or shortness of breath.  Daughter-in-law at bedside reported and patient has been stable recently had several times of near fall.  ED Course: Patient found to be in rapid A-fib.  Blood pressure elevated.  CT chest abdomen showed similar bilateral subsegmental PE as on last admission.  CT head negative for acute intracranial findings. K=2.8  Review of Systems: As per HPI otherwise 14 point review of systems negative.    Past Medical History:  Diagnosis Date   Blood transfusion    Breast cancer, left ([place]) 09/26/2011   Punch biopsies of the left nipple done on 09/16/2011.  ER and PR were negative.  HER2 is positive with ratio 4.68.Had mastectomy on 10/25/11. Path T1,N0.    Chronic low back pain    Dyslipidemia    GERD (gastroesophageal reflux disease)    HTN (hypertension)    Osteoarthritis    Osteoporosis     Past Surgical History:  Procedure Laterality Date   BACK SURGERY  2009   lumb lam   BREAST SURGERY  2009   lt lumpectomy-plus 2 re-excisions for margins   left breast mastectomy  2013   MASTECTOMY Left    ROTATOR CUFF REPAIR     Bilateral, 2010   TOTAL ABDOMINAL  HYSTERECTOMY       reports that she has never smoked. She has never used smokeless tobacco. She reports that she does not drink alcohol and does not use drugs.  Allergies  Allergen Reactions   Amoxil [Amoxicillin] Nausea Only   Codeine Nausea Only   Aricept [Donepezil Hcl] Other (See Comments)    "Felt drunk" Headaches Drowsiness   Catapres [Clonidine] Other (See Comments)    Dry mouth   Hycodan [Hydrocodone Bit-Homatrop Mbr] Diarrhea and Nausea And Vomiting   Norco [Hydrocodone-Acetaminophen] Diarrhea and Nausea And Vomiting   Norvasc [Amlodipine] Swelling   Prolia [Denosumab] Other (See Comments)    Unknown reaction   Ultram [Tramadol] Nausea Only   Zoloft [Sertraline] Nausea Only   Clavulanic Acid Hives and Rash    Family History  Problem Relation Age of Onset   Cancer Sister        breast   Breast cancer Sister    Colon cancer Brother    Cancer Sister        lung   Diabetes Son    Esophageal cancer Neg Hx    Rectal cancer Neg Hx    Stomach cancer Neg Hx      Prior to Admission medications   Medication Sig Start Date End Date Taking? Authorizing Provider  acetaminophen (TYLENOL) 500 MG tablet Take 500-1,000 mg by mouth See admin instructions. 1000 mg every 12 hours + 500 mg every 6  hours PRN for fever 99.5-101, minor headache, minor discomfort.    [provider]  apixaban (ELIQUIS) 5 MG TABS tablet Take 2 tablets (10 mg total) by mouth 2 (two) times daily for 6 days, THEN 1 tablet (5 mg total) 2 (two) times daily for 24 days. 09/29/21 10/29/21  Amin, Jeanella Flattery, MD  atorvastatin (LIPITOR) 10 MG tablet Take 1 tablet (10 mg total) by mouth daily at 6 PM. Patient taking differently: Take 10 mg by mouth at bedtime. 08/03/21   Little Ishikawa, MD  diltiazem (CARDIZEM CD) 120 MG 24 hr capsule Take 1 capsule (120 mg total) by mouth daily. 08/03/21 08/03/22  Little Ishikawa, MD  hydrochlorothiazide (HYDRODIURIL) 25 MG tablet Take 25 mg by mouth daily.     [provider]  metoprolol succinate (TOPROL-XL) 50 MG 24 hr tablet Take 3 tablets (150 mg total) by mouth daily. Take with or immediately following a meal. 09/24/21   Amin, Jeanella Flattery, MD  Omega-3 Fatty Acids (OMEGA-3 PO) Take 1 capsule by mouth daily. Omega 3 - dha - epa - fish oil [phone] mg capsule    [provider]  potassium chloride SA (KLOR-CON M) 20 MEQ tablet Take 20 mEq by mouth daily.    [provider]  saccharomyces boulardii (FLORASTOR) 250 MG capsule Take 250 mg by mouth 2 (two) times daily.    [provider]    Physical Exam: Vitals:   10/08/21 1500 10/08/21 1515 10/08/21 1530 10/08/21 1545  BP: (!) 164/106 (!) 173/106 (!) 175/114 (!) 160/102  Pulse: (!) 125 (!) 125 (!) 127 (!) 129  Resp: (!) 30 (!) 23 17 (!) 22  Temp:      TempSrc:      SpO2: 95% 99% 98% 97%  Weight:      Height:        Constitutional: NAD, calm, comfortable Vitals:   10/08/21 1500 10/08/21 1515 10/08/21 1530 10/08/21 1545  BP: (!) 164/106 (!) 173/106 (!) 175/114 (!) 160/102  Pulse: (!) 125 (!) 125 (!) 127 (!) 129  Resp: (!) 30 (!) 23 17 (!) 22  Temp:      TempSrc:      SpO2: 95% 99% 98% 97%  Weight:      Height:       Eyes: PERRL, lids and conjunctivae normal ENMT: Mucous membranes are moist. Posterior pharynx clear of any exudate or lesions.Normal dentition.  Neck: normal, supple, no masses, no thyromegaly Respiratory: clear to auscultation bilaterally, no wheezing, no crackles. Normal respiratory effort. No accessory muscle use.  Cardiovascular: Irregular heart rate, no murmurs / rubs / gallops.  2+ extremity edema. 2+ pedal pulses. No carotid bruits.  Abdomen: no tenderness, no masses palpated. No hepatosplenomegaly. Bowel sounds positive.  Musculoskeletal: no clubbing / cyanosis. No joint deformity upper and lower extremities. Good ROM, no contractures. Normal muscle tone.  Skin: no rashes, lesions, ulcers. No induration Neurologic: CN 2-12  grossly intact. Sensation intact, DTR normal. Strength 5/5 in all 4.  Psychiatric: Normal judgment and insight. Alert and oriented x 3. Normal mood.     Labs on Admission: I have personally reviewed following labs and imaging studies  CBC: Recent Labs  Lab 10/08/21 0839  WBC 8.0  HGB 10.5*  HCT 32.2*  MCV 93.9  PLT 301*   Basic Metabolic Panel: Recent Labs  Lab 10/08/21 0839  NA 136  K 2.8*  CL 99  CO2 28  GLUCOSE 109*  BUN 11  CREATININE 0.69  CALCIUM 8.7*   GFR: Estimated Creatinine Clearance: 41.2 mL/min (by C-G formula based on SCr of 0.69 mg/dL). Liver Function Tests: No results for input(s): "AST", "ALT", "ALKPHOS", "BILITOT", "PROT", "ALBUMIN" in the last 168 hours. No results for input(s): "LIPASE", "AMYLASE" in the last 168 hours. No results for input(s): "AMMONIA" in the last 168 hours. Coagulation Profile: No results for input(s): "INR", "PROTIME" in the last 168 hours. Cardiac Enzymes: No results for input(s): "CKTOTAL", "CKMB", "CKMBINDEX", "TROPONINI" in the last 168 hours. BNP (last 3 results) No results for input(s): "PROBNP" in the last 8760 hours. HbA1C: No results for input(s): "HGBA1C" in the last 72 hours. CBG: No results for input(s): "GLUCAP" in the last 168 hours. Lipid Profile: No results for input(s): "CHOL", "HDL", "LDLCALC", "TRIG", "CHOLHDL", "LDLDIRECT" in the last 72 hours. Thyroid Function Tests: No results for input(s): "TSH", "T4TOTAL", "FREET4", "T3FREE", "THYROIDAB" in the last 72 hours. Anemia Panel: No results for input(s): "VITAMINB12", "FOLATE", "FERRITIN", "TIBC", "IRON", "RETICCTPCT" in the last 72 hours. Urine analysis:    Component Value Date/Time   COLORURINE YELLOW 09/21/2021 0530   APPEARANCEUR CLEAR 09/21/2021 0530   LABSPEC 1.008 09/21/2021 0530   PHURINE 5.0 09/21/2021 0530   GLUCOSEU NEGATIVE 09/21/2021 0530   HGBUR NEGATIVE 09/21/2021 0530   BILIRUBINUR NEGATIVE 09/21/2021 0530   KETONESUR NEGATIVE  09/21/2021 0530   PROTEINUR NEGATIVE 09/21/2021 0530   NITRITE NEGATIVE 09/21/2021 0530   LEUKOCYTESUR SMALL (A) 09/21/2021 0530    Radiological Exams on Admission: CT CHEST ABDOMEN PELVIS W CONTRAST  Result Date: 10/08/2021 CLINICAL DATA:  Blunt polytrauma EXAM: CT CHEST, ABDOMEN, AND PELVIS WITH CONTRAST TECHNIQUE: Multidetector CT imaging of the chest, abdomen and pelvis was performed following the standard protocol during bolus administration of intravenous contrast. RADIATION DOSE REDUCTION: This exam was performed according to the departmental dose-optimization program which includes automated exposure control, adjustment of the mA and/or kV according to patient size and/or use of iterative reconstruction technique. CONTRAST:  62m OMNIPAQUE IOHEXOL 300 MG/ML SOLN IV. No oral contrast. COMPARISON:  CT angio chest 09/20/2021, CT abdomen and pelvis 10/04/2006 FINDINGS: CT CHEST FINDINGS Cardiovascular: Aorta normal caliber. Scattered atherosclerotic calcifications aorta in coronary arteries. Enlargement of cardiac chambers greatest at RIGHT atrium. No pericardial effusion. Pulmonary arteries adequately opacified. Large filling defect again identified within RIGHT pulmonary artery consistent with pulmonary embolism. Occlusive thrombus extends into RIGHT middle and RIGHT lower lobe pulmonary arteries. Smaller pulmonary emboli are seen in BILATERAL upper and LEFT lower lobe pulmonary arteries. These were all evident on the prior study as well. Mediastinum/Nodes: Esophagus unremarkable. Base of cervical region normal appearance. No thoracic adenopathy. Lungs/Pleura: Moderate RIGHT pleural effusion. Subsegmental atelectasis RIGHT lower and RIGHT middle lobes. Subpleural interstitial changes LEFT lung again seen. No new infiltrate or pneumothorax. Musculoskeletal: Diffuse osseous demineralization. LEFT breast prosthesis. CT ABDOMEN PELVIS FINDINGS Hepatobiliary: Gallbladder and liver normal appearance  Pancreas: Few scattered parenchymal calcifications. Atrophic. No definite mass. Spleen: Normal appearance Adrenals/Urinary Tract: Nonobstructing LEFT renal calculus. Adrenal glands, kidneys, ureters, and bladder otherwise normal appearance. Stomach/Bowel: Appendix not visualized. Scattered stool throughout colon. Uncomplicated sigmoid diverticula. Stomach and bowel loops otherwise unremarkable. Vascular/Lymphatic: Atherosclerotic calcifications aorta and iliac arteries without aneurysm. No adenopathy. Reproductive: Uterus surgically absent with nonvisualization of ovaries Other: No free air or free fluid.  No hernia. Musculoskeletal: Osseous demineralization with postsurgical changes of posterior L5-S1 fusion. IMPRESSION: BILATERAL pulmonary emboli as above with much larger clot burden on RIGHT, unchanged since 09/20/2021, with persistent enlargement of cardiac chambers particularly RIGHT atrium.  Moderate RIGHT pleural effusion with subsegmental atelectasis at RIGHT lung base. No acute intra-abdominal or intrapelvic abnormalities. Nonobstructing LEFT renal calculus. Uncomplicated sigmoid diverticulosis. Aortic Atherosclerosis (ICD10-I70.0). Findings discussed with Dr. Alvino Chapel on 10/08/2021 at 1249 hours. Electronically Signed   By: Lavonia Dana M.D.   On: 10/08/2021 12:49   CT HEAD WO CONTRAST (5MM)  Result Date: 10/08/2021 CLINICAL DATA:  Head trauma, minor (Age >= 65y); Facial trauma, blunt; Neck trauma (Age >= 65y) EXAM: CT HEAD WITHOUT CONTRAST CT MAXILLOFACIAL WITHOUT CONTRAST CT CERVICAL SPINE WITHOUT CONTRAST TECHNIQUE: Multidetector CT imaging of the head, cervical spine, and maxillofacial structures were performed using the standard protocol without intravenous contrast. Multiplanar CT image reconstructions of the cervical spine and maxillofacial structures were also generated. RADIATION DOSE REDUCTION: This exam was performed according to the departmental dose-optimization program which includes  automated exposure control, adjustment of the mA and/or kV according to patient size and/or use of iterative reconstruction technique. COMPARISON:  None Available. FINDINGS: CT HEAD FINDINGS Brain: No evidence of acute infarction, hemorrhage, hydrocephalus, extra-axial collection or mass lesion/mass effect. Patchy white matter hypoattenuation, nonspecific but compatible with chronic microvascular ischemic disease. Cerebral atrophy. Vascular: No hyperdense vessel identified. Calcific intracranial atherosclerosis. Skull: No acute fracture. Other: No mastoid effusions. CT MAXILLOFACIAL FINDINGS Osseous: No fracture or mandibular dislocation. No destructive process. Orbits: Negative. No traumatic or inflammatory finding. Sinuses: Clear sinuses. Soft tissues: Negative. CT CERVICAL SPINE FINDINGS Alignment: Unchanged alignment with similar anterolisthesis of C4 on C5 and C7 on T1. No new sagittal subluxation. Skull base and vertebrae: Vertebral body heights are maintained. No evidence of acute fracture. Soft tissues and spinal canal: No prevertebral fluid or swelling. No visible canal hematoma. Disc levels: Similar multilevel degenerative disc disease, moderate to severe. Similar multilevel facet and uncovertebral hypertrophy with varying degrees of neural foraminal stenosis. Upper chest: Partially imaged right pleural effusion. IMPRESSION: 1. No evidence of acute intracranial abnormality or facial fracture. 2. No evidence of acute fracture or traumatic malalignment in the cervical spine. 3. Partially imaged right pleural effusion. Electronically Signed   By: Margaretha Sheffield M.D.   On: 10/08/2021 09:07   CT Maxillofacial Wo Contrast  Result Date: 10/08/2021 CLINICAL DATA:  Head trauma, minor (Age >= 65y); Facial trauma, blunt; Neck trauma (Age >= 65y) EXAM: CT HEAD WITHOUT CONTRAST CT MAXILLOFACIAL WITHOUT CONTRAST CT CERVICAL SPINE WITHOUT CONTRAST TECHNIQUE: Multidetector CT imaging of the head, cervical spine,  and maxillofacial structures were performed using the standard protocol without intravenous contrast. Multiplanar CT image reconstructions of the cervical spine and maxillofacial structures were also generated. RADIATION DOSE REDUCTION: This exam was performed according to the departmental dose-optimization program which includes automated exposure control, adjustment of the mA and/or kV according to patient size and/or use of iterative reconstruction technique. COMPARISON:  None Available. FINDINGS: CT HEAD FINDINGS Brain: No evidence of acute infarction, hemorrhage, hydrocephalus, extra-axial collection or mass lesion/mass effect. Patchy white matter hypoattenuation, nonspecific but compatible with chronic microvascular ischemic disease. Cerebral atrophy. Vascular: No hyperdense vessel identified. Calcific intracranial atherosclerosis. Skull: No acute fracture. Other: No mastoid effusions. CT MAXILLOFACIAL FINDINGS Osseous: No fracture or mandibular dislocation. No destructive process. Orbits: Negative. No traumatic or inflammatory finding. Sinuses: Clear sinuses. Soft tissues: Negative. CT CERVICAL SPINE FINDINGS Alignment: Unchanged alignment with similar anterolisthesis of C4 on C5 and C7 on T1. No new sagittal subluxation. Skull base and vertebrae: Vertebral body heights are maintained. No evidence of acute fracture. Soft tissues and spinal canal: No prevertebral fluid or swelling. No visible  canal hematoma. Disc levels: Similar multilevel degenerative disc disease, moderate to severe. Similar multilevel facet and uncovertebral hypertrophy with varying degrees of neural foraminal stenosis. Upper chest: Partially imaged right pleural effusion. IMPRESSION: 1. No evidence of acute intracranial abnormality or facial fracture. 2. No evidence of acute fracture or traumatic malalignment in the cervical spine. 3. Partially imaged right pleural effusion. Electronically Signed   By: Margaretha Sheffield M.D.   On:  10/08/2021 09:07   CT Cervical Spine Wo Contrast  Result Date: 10/08/2021 CLINICAL DATA:  Head trauma, minor (Age >= 65y); Facial trauma, blunt; Neck trauma (Age >= 65y) EXAM: CT HEAD WITHOUT CONTRAST CT MAXILLOFACIAL WITHOUT CONTRAST CT CERVICAL SPINE WITHOUT CONTRAST TECHNIQUE: Multidetector CT imaging of the head, cervical spine, and maxillofacial structures were performed using the standard protocol without intravenous contrast. Multiplanar CT image reconstructions of the cervical spine and maxillofacial structures were also generated. RADIATION DOSE REDUCTION: This exam was performed according to the departmental dose-optimization program which includes automated exposure control, adjustment of the mA and/or kV according to patient size and/or use of iterative reconstruction technique. COMPARISON:  None Available. FINDINGS: CT HEAD FINDINGS Brain: No evidence of acute infarction, hemorrhage, hydrocephalus, extra-axial collection or mass lesion/mass effect. Patchy white matter hypoattenuation, nonspecific but compatible with chronic microvascular ischemic disease. Cerebral atrophy. Vascular: No hyperdense vessel identified. Calcific intracranial atherosclerosis. Skull: No acute fracture. Other: No mastoid effusions. CT MAXILLOFACIAL FINDINGS Osseous: No fracture or mandibular dislocation. No destructive process. Orbits: Negative. No traumatic or inflammatory finding. Sinuses: Clear sinuses. Soft tissues: Negative. CT CERVICAL SPINE FINDINGS Alignment: Unchanged alignment with similar anterolisthesis of C4 on C5 and C7 on T1. No new sagittal subluxation. Skull base and vertebrae: Vertebral body heights are maintained. No evidence of acute fracture. Soft tissues and spinal canal: No prevertebral fluid or swelling. No visible canal hematoma. Disc levels: Similar multilevel degenerative disc disease, moderate to severe. Similar multilevel facet and uncovertebral hypertrophy with varying degrees of neural  foraminal stenosis. Upper chest: Partially imaged right pleural effusion. IMPRESSION: 1. No evidence of acute intracranial abnormality or facial fracture. 2. No evidence of acute fracture or traumatic malalignment in the cervical spine. 3. Partially imaged right pleural effusion. Electronically Signed   By: Margaretha Sheffield M.D.   On: 10/08/2021 09:07   DG Pelvis Portable  Result Date: 10/08/2021 CLINICAL DATA:  Fall EXAM: PORTABLE PELVIS 1-2 VIEWS COMPARISON:  Portable exam 0833 hours compared to 08/13/2021 FINDINGS: Osseous demineralization. BILATERAL hip joint narrowing and spur formation. SI joints preserved. Prior L5-S1 fusion. No acute fracture, dislocation, or bone destruction. Atherosclerotic calcifications at the femoral arteries bilaterally. Significant lateral soft tissue swelling at the level of the LEFT hip and proximal LEFT femur. IMPRESSION: No acute osseous abnormalities. Degenerative changes of BILATERAL hip joints. Electronically Signed   By: Lavonia Dana M.D.   On: 10/08/2021 08:50   DG Chest Portable 1 View  Result Date: 10/08/2021 CLINICAL DATA:  Fall EXAM: PORTABLE CHEST 1 VIEW COMPARISON:  Portable exam 0832 hours compared to 09/20/2021 FINDINGS: Enlargement of cardiac silhouette. Atherosclerotic calcification aorta. Mediastinal contours and pulmonary vascularity otherwise normal. Small RIGHT pleural effusion with RIGHT basilar atelectasis, minimally increased from previous exam. No acute infiltrate or pneumothorax. Diffuse osseous demineralization with question chronic LEFT rotator cuff tear. Degenerative changes at RIGHT sternoclavicular joint. Degenerative disc disease changes and biconvex scoliosis thoracolumbar spine. No definite acute fractures. IMPRESSION: Slightly increased RIGHT pleural effusion and basilar atelectasis versus prior exam. Enlargement of cardiac silhouette. Aortic Atherosclerosis (ICD10-I70.0). Electronically Signed  By: Lavonia Dana M.D.   On: 10/08/2021 08:48     EKG: Independently reviewed.  A-fib with RVR, no acute ST changes.  Assessment/Plan Principal Problem:   A-fib [facility]) Active Problems:   Paroxysmal atrial fibrillation with RVR (HCC)  (please populate well all problems here in Problem List. (For example, if patient is on BP meds at home and you resume or decide to hold them, it is a problem that needs to be her. Same for CAD, COPD, HLD and so on)  A-fib with RVR -Likely secondary to acute on chronic hypokalemia, which is severe -IV and p.o. replacement, recheck level at20:00 -She has a history of HFrEF, now with signs of decompensation probably secondary to uncontrolled A-fib, with pleural effusion and pitting edema.  We will hold off Cardizem for now given the patient is in acute decompensation of CHF, plan to use digoxin loading with every 6 hours x4 doses, but will wait until K level recovers to at least 3.5, set first dose of digoxin at 20:00 -Increase p.o. metoprolol to 100 mg twice daily -As needed Lopressor  Severe hypokalemia -IV and p.o. replacement, recheck level tonight and tomorrow morning -Magnesium and phosphorus level pending -Hold off HCTZ, start lisinopril  Bilateral PE and DVT -Size and location of the bilateral pulmonary thrombosis appears to be stable compared to recent CT study 2 weeks ago.  Clinically patient does not have hypoxia, her A-fib/tachycardia likely from electrolyte imbalance, and her blood pressure not compatible with right-sided dysfunction.  Plan to use heparin drip for now given the acute episode after fall, if H&H stable, likely can transfer back to Eliquis tomorrow.  Acute on chronic HFrEF -Likely secondary to uncontrolled A-fib and uncontrolled HTN -Increase metoprolol, start lisinopril -Consider Lasix once K level stabilized  Acute on chronic ambulation dysfunction -ED evaluation tomorrow   DVT prophylaxis: Heparin drip Code Status: DNR Family Communication: Daughter-in-law Disposition  Plan: Patient sick with uncontrolled A-fib and acute CHF decompensation requiring IV medications, expect more than 2 midnight hospital stay Consults called: None Admission status: PCU   Lequita Halt MD [facility] Pager [phone]  10/08/2021, 4:45 PM

## 2021-10-08 NOTE — ED Notes (Signed)
ED TO INPATIENT HANDOFF REPORT  ED Nurse Name and Phone #: Josh  S Name/Age/Gender Judy [name] 88 y.o. female Room/Bed: 005C/005C  Code Status   Code Status: DNR  Home/SNF/Other Nursing Home Patient oriented to: self Is this baseline? No   Triage Complete: Triage complete  Chief Complaint A-fib [facility]) [I48.91]  Triage Note Pt arrived via [facility] from [facility] with c/c of Fall on thinners. Per EMS mechanical fall while making bed. Hit head on wall while making bed. Pt on eliquis. Compliant some dizziness which started after fall. Previous fall last week with left hip bruising and shoulder. Hx of a-fib & HTN. Follow command & A&O x4. Pt arrived having approx. 1inch laceration to right forehead   100-120HR, 150/110, CBG 114, 98%RA   Allergies Allergies  Allergen Reactions   Amoxil [Amoxicillin] Nausea Only   Codeine Nausea Only   Aricept [Donepezil Hcl] Other (See Comments)    "Felt drunk" Headaches Drowsiness   Catapres [Clonidine] Other (See Comments)    Dry mouth   Hycodan [Hydrocodone Bit-Homatrop Mbr] Diarrhea and Nausea And Vomiting   Norco [Hydrocodone-Acetaminophen] Diarrhea and Nausea And Vomiting   Norvasc [Amlodipine] Swelling   Prolia [Denosumab] Other (See Comments)    Unknown reaction   Ultram [Tramadol] Nausea Only   Zoloft [Sertraline] Nausea Only   Clavulanic Acid Hives and Rash    Level of Care/Admitting Diagnosis ED Disposition     ED Disposition  Admit   Condition  --   [facility]: [facility] [[id number]]  Level of Care: Progressive [102]  Admit to Progressive based on following criteria: CARDIOVASCULAR & THORACIC of moderate stability with acute coronary syndrome symptoms/low risk myocardial infarction/hypertensive urgency/arrhythmias/heart failure potentially compromising stability and stable post cardiovascular intervention patients.  May admit patient to [facility] or [facility] if equivalent level of  care is available:: No  Covid Evaluation: Asymptomatic - no recent exposure (last 10 days) testing not required  Diagnosis: A-fib [facility]) [[id number]]  Admitting Physician: Lequita Halt [[id number]]  Attending Physician: Lequita Halt [[id number]]  Certification:: I certify this patient will need inpatient services for at least 2 midnights  Estimated Length of Stay: 2          B Medical/Surgery History Past Medical History:  Diagnosis Date   Blood transfusion    Breast cancer, left ([place]) 09/26/2011   Punch biopsies of the left nipple done on 09/16/2011.  ER and PR were negative.  HER2 is positive with ratio 4.68.Had mastectomy on 10/25/11. Path T1,N0.    Chronic low back pain    Dyslipidemia    GERD (gastroesophageal reflux disease)    HTN (hypertension)    Osteoarthritis    Osteoporosis    Past Surgical History:  Procedure Laterality Date   BACK SURGERY  2009   lumb lam   BREAST SURGERY  2009   lt lumpectomy-plus 2 re-excisions for margins   left breast mastectomy  2013   MASTECTOMY Left    ROTATOR CUFF REPAIR     Bilateral, 2010   TOTAL ABDOMINAL HYSTERECTOMY       A IV Location/Drains/Wounds Patient Lines/Drains/Airways Status     Active Line/Drains/Airways     Name Placement date Placement time Site Days   Peripheral IV 10/08/21 20 G 1.88" Anterior;Left Forearm 10/08/21  1200  Forearm  less than 1   Peripheral IV 10/08/21 22 G 1" Left Hand 10/08/21  2008  Hand  less than 1  Closed System Drain 1 Left Breast Bulb (JP) 19 Fr. 10/25/11  1140  Breast  3636   Incision 10/25/11 Breast Left 10/25/11  1206  -- 3636            Intake/Output Last 24 hours No intake or output data in the 24 hours ending 10/08/21 2011  Labs/Imaging Results for orders placed or performed during the hospital encounter of 10/08/21 (from the past 48 hour(s))  Basic metabolic panel     Status: Abnormal   Collection Time: 10/08/21  8:39 AM  Result Value Ref Range   Sodium 136 135 - 145 mmol/L    Potassium 2.8 (L) 3.5 - 5.1 mmol/L   Chloride 99 98 - 111 mmol/L   CO2 28 22 - 32 mmol/L   Glucose, Bld 109 (H) 70 - 99 mg/dL    Comment: Glucose reference range applies only to samples taken after fasting for at least 8 hours.   BUN 11 8 - 23 mg/dL   Creatinine, Ser 0.69 0.44 - 1.00 mg/dL   Calcium 8.7 (L) 8.9 - 10.3 mg/dL   GFR, Estimated >60 >60 mL/min    Comment: (NOTE) Calculated using the CKD-EPI Creatinine Equation (2021)    Anion gap 9 5 - 15    Comment: Performed at [facility] [address], [place], [place] [address]  CBC     Status: Abnormal   Collection Time: 10/08/21  8:39 AM  Result Value Ref Range   WBC 8.0 4.0 - 10.5 K/uL   RBC 3.43 (L) 3.87 - 5.11 MIL/uL   Hemoglobin 10.5 (L) 12.0 - 15.0 g/dL   HCT 32.2 (L) 36.0 - 46.0 %   MCV 93.9 80.0 - 100.0 fL   MCH 30.6 26.0 - 34.0 pg   MCHC 32.6 30.0 - 36.0 g/dL   RDW 17.5 (H) 11.5 - 15.5 %   Platelets 434 (H) 150 - 400 K/uL   nRBC 0.0 0.0 - 0.2 %    Comment: Performed at [facility], [place] [address], [place], [place] [address]   CT CHEST ABDOMEN PELVIS W CONTRAST  Result Date: 10/08/2021 CLINICAL DATA:  Blunt polytrauma EXAM: CT CHEST, ABDOMEN, AND PELVIS WITH CONTRAST TECHNIQUE: Multidetector CT imaging of the chest, abdomen and pelvis was performed following the standard protocol during bolus administration of intravenous contrast. RADIATION DOSE REDUCTION: This exam was performed according to the departmental dose-optimization program which includes automated exposure control, adjustment of the mA and/or kV according to patient size and/or use of iterative reconstruction technique. CONTRAST:  39m OMNIPAQUE IOHEXOL 300 MG/ML SOLN IV. No oral contrast. COMPARISON:  CT angio chest 09/20/2021, CT abdomen and pelvis 10/04/2006 FINDINGS: CT CHEST FINDINGS Cardiovascular: Aorta normal caliber. Scattered atherosclerotic calcifications aorta in coronary arteries. Enlargement of cardiac chambers greatest at RIGHT  atrium. No pericardial effusion. Pulmonary arteries adequately opacified. Large filling defect again identified within RIGHT pulmonary artery consistent with pulmonary embolism. Occlusive thrombus extends into RIGHT middle and RIGHT lower lobe pulmonary arteries. Smaller pulmonary emboli are seen in BILATERAL upper and LEFT lower lobe pulmonary arteries. These were all evident on the prior study as well. Mediastinum/Nodes: Esophagus unremarkable. Base of cervical region normal appearance. No thoracic adenopathy. Lungs/Pleura: Moderate RIGHT pleural effusion. Subsegmental atelectasis RIGHT lower and RIGHT middle lobes. Subpleural interstitial changes LEFT lung again seen. No new infiltrate or pneumothorax. Musculoskeletal: Diffuse osseous demineralization. LEFT breast prosthesis. CT ABDOMEN PELVIS FINDINGS Hepatobiliary: Gallbladder and liver normal appearance Pancreas: Few scattered parenchymal calcifications. Atrophic. No  definite mass. Spleen: Normal appearance Adrenals/Urinary Tract: Nonobstructing LEFT renal calculus. Adrenal glands, kidneys, ureters, and bladder otherwise normal appearance. Stomach/Bowel: Appendix not visualized. Scattered stool throughout colon. Uncomplicated sigmoid diverticula. Stomach and bowel loops otherwise unremarkable. Vascular/Lymphatic: Atherosclerotic calcifications aorta and iliac arteries without aneurysm. No adenopathy. Reproductive: Uterus surgically absent with nonvisualization of ovaries Other: No free air or free fluid.  No hernia. Musculoskeletal: Osseous demineralization with postsurgical changes of posterior L5-S1 fusion. IMPRESSION: BILATERAL pulmonary emboli as above with much larger clot burden on RIGHT, unchanged since 09/20/2021, with persistent enlargement of cardiac chambers particularly RIGHT atrium. Moderate RIGHT pleural effusion with subsegmental atelectasis at RIGHT lung base. No acute intra-abdominal or intrapelvic abnormalities. Nonobstructing LEFT renal  calculus. Uncomplicated sigmoid diverticulosis. Aortic Atherosclerosis (ICD10-I70.0). Findings discussed with Dr. Alvino Chapel on 10/08/2021 at 1249 hours. Electronically Signed   By: Lavonia Dana M.D.   On: 10/08/2021 12:49   CT HEAD WO CONTRAST (5MM)  Result Date: 10/08/2021 CLINICAL DATA:  Head trauma, minor (Age >= 65y); Facial trauma, blunt; Neck trauma (Age >= 65y) EXAM: CT HEAD WITHOUT CONTRAST CT MAXILLOFACIAL WITHOUT CONTRAST CT CERVICAL SPINE WITHOUT CONTRAST TECHNIQUE: Multidetector CT imaging of the head, cervical spine, and maxillofacial structures were performed using the standard protocol without intravenous contrast. Multiplanar CT image reconstructions of the cervical spine and maxillofacial structures were also generated. RADIATION DOSE REDUCTION: This exam was performed according to the departmental dose-optimization program which includes automated exposure control, adjustment of the mA and/or kV according to patient size and/or use of iterative reconstruction technique. COMPARISON:  None Available. FINDINGS: CT HEAD FINDINGS Brain: No evidence of acute infarction, hemorrhage, hydrocephalus, extra-axial collection or mass lesion/mass effect. Patchy white matter hypoattenuation, nonspecific but compatible with chronic microvascular ischemic disease. Cerebral atrophy. Vascular: No hyperdense vessel identified. Calcific intracranial atherosclerosis. Skull: No acute fracture. Other: No mastoid effusions. CT MAXILLOFACIAL FINDINGS Osseous: No fracture or mandibular dislocation. No destructive process. Orbits: Negative. No traumatic or inflammatory finding. Sinuses: Clear sinuses. Soft tissues: Negative. CT CERVICAL SPINE FINDINGS Alignment: Unchanged alignment with similar anterolisthesis of C4 on C5 and C7 on T1. No new sagittal subluxation. Skull base and vertebrae: Vertebral body heights are maintained. No evidence of acute fracture. Soft tissues and spinal canal: No prevertebral fluid or swelling.  No visible canal hematoma. Disc levels: Similar multilevel degenerative disc disease, moderate to severe. Similar multilevel facet and uncovertebral hypertrophy with varying degrees of neural foraminal stenosis. Upper chest: Partially imaged right pleural effusion. IMPRESSION: 1. No evidence of acute intracranial abnormality or facial fracture. 2. No evidence of acute fracture or traumatic malalignment in the cervical spine. 3. Partially imaged right pleural effusion. Electronically Signed   By: Margaretha Sheffield M.D.   On: 10/08/2021 09:07   CT Maxillofacial Wo Contrast  Result Date: 10/08/2021 CLINICAL DATA:  Head trauma, minor (Age >= 65y); Facial trauma, blunt; Neck trauma (Age >= 65y) EXAM: CT HEAD WITHOUT CONTRAST CT MAXILLOFACIAL WITHOUT CONTRAST CT CERVICAL SPINE WITHOUT CONTRAST TECHNIQUE: Multidetector CT imaging of the head, cervical spine, and maxillofacial structures were performed using the standard protocol without intravenous contrast. Multiplanar CT image reconstructions of the cervical spine and maxillofacial structures were also generated. RADIATION DOSE REDUCTION: This exam was performed according to the departmental dose-optimization program which includes automated exposure control, adjustment of the mA and/or kV according to patient size and/or use of iterative reconstruction technique. COMPARISON:  None Available. FINDINGS: CT HEAD FINDINGS Brain: No evidence of acute infarction, hemorrhage, hydrocephalus, extra-axial collection or mass lesion/mass effect. Patchy white  matter hypoattenuation, nonspecific but compatible with chronic microvascular ischemic disease. Cerebral atrophy. Vascular: No hyperdense vessel identified. Calcific intracranial atherosclerosis. Skull: No acute fracture. Other: No mastoid effusions. CT MAXILLOFACIAL FINDINGS Osseous: No fracture or mandibular dislocation. No destructive process. Orbits: Negative. No traumatic or inflammatory finding. Sinuses: Clear  sinuses. Soft tissues: Negative. CT CERVICAL SPINE FINDINGS Alignment: Unchanged alignment with similar anterolisthesis of C4 on C5 and C7 on T1. No new sagittal subluxation. Skull base and vertebrae: Vertebral body heights are maintained. No evidence of acute fracture. Soft tissues and spinal canal: No prevertebral fluid or swelling. No visible canal hematoma. Disc levels: Similar multilevel degenerative disc disease, moderate to severe. Similar multilevel facet and uncovertebral hypertrophy with varying degrees of neural foraminal stenosis. Upper chest: Partially imaged right pleural effusion. IMPRESSION: 1. No evidence of acute intracranial abnormality or facial fracture. 2. No evidence of acute fracture or traumatic malalignment in the cervical spine. 3. Partially imaged right pleural effusion. Electronically Signed   By: Margaretha Sheffield M.D.   On: 10/08/2021 09:07   CT Cervical Spine Wo Contrast  Result Date: 10/08/2021 CLINICAL DATA:  Head trauma, minor (Age >= 65y); Facial trauma, blunt; Neck trauma (Age >= 65y) EXAM: CT HEAD WITHOUT CONTRAST CT MAXILLOFACIAL WITHOUT CONTRAST CT CERVICAL SPINE WITHOUT CONTRAST TECHNIQUE: Multidetector CT imaging of the head, cervical spine, and maxillofacial structures were performed using the standard protocol without intravenous contrast. Multiplanar CT image reconstructions of the cervical spine and maxillofacial structures were also generated. RADIATION DOSE REDUCTION: This exam was performed according to the departmental dose-optimization program which includes automated exposure control, adjustment of the mA and/or kV according to patient size and/or use of iterative reconstruction technique. COMPARISON:  None Available. FINDINGS: CT HEAD FINDINGS Brain: No evidence of acute infarction, hemorrhage, hydrocephalus, extra-axial collection or mass lesion/mass effect. Patchy white matter hypoattenuation, nonspecific but compatible with chronic microvascular ischemic  disease. Cerebral atrophy. Vascular: No hyperdense vessel identified. Calcific intracranial atherosclerosis. Skull: No acute fracture. Other: No mastoid effusions. CT MAXILLOFACIAL FINDINGS Osseous: No fracture or mandibular dislocation. No destructive process. Orbits: Negative. No traumatic or inflammatory finding. Sinuses: Clear sinuses. Soft tissues: Negative. CT CERVICAL SPINE FINDINGS Alignment: Unchanged alignment with similar anterolisthesis of C4 on C5 and C7 on T1. No new sagittal subluxation. Skull base and vertebrae: Vertebral body heights are maintained. No evidence of acute fracture. Soft tissues and spinal canal: No prevertebral fluid or swelling. No visible canal hematoma. Disc levels: Similar multilevel degenerative disc disease, moderate to severe. Similar multilevel facet and uncovertebral hypertrophy with varying degrees of neural foraminal stenosis. Upper chest: Partially imaged right pleural effusion. IMPRESSION: 1. No evidence of acute intracranial abnormality or facial fracture. 2. No evidence of acute fracture or traumatic malalignment in the cervical spine. 3. Partially imaged right pleural effusion. Electronically Signed   By: Margaretha Sheffield M.D.   On: 10/08/2021 09:07   DG Pelvis Portable  Result Date: 10/08/2021 CLINICAL DATA:  Fall EXAM: PORTABLE PELVIS 1-2 VIEWS COMPARISON:  Portable exam 0833 hours compared to 08/13/2021 FINDINGS: Osseous demineralization. BILATERAL hip joint narrowing and spur formation. SI joints preserved. Prior L5-S1 fusion. No acute fracture, dislocation, or bone destruction. Atherosclerotic calcifications at the femoral arteries bilaterally. Significant lateral soft tissue swelling at the level of the LEFT hip and proximal LEFT femur. IMPRESSION: No acute osseous abnormalities. Degenerative changes of BILATERAL hip joints. Electronically Signed   By: Lavonia Dana M.D.   On: 10/08/2021 08:50   DG Chest Portable 1 View  Result Date: 10/08/2021  CLINICAL  DATA:  Fall EXAM: PORTABLE CHEST 1 VIEW COMPARISON:  Portable exam 0832 hours compared to 09/20/2021 FINDINGS: Enlargement of cardiac silhouette. Atherosclerotic calcification aorta. Mediastinal contours and pulmonary vascularity otherwise normal. Small RIGHT pleural effusion with RIGHT basilar atelectasis, minimally increased from previous exam. No acute infiltrate or pneumothorax. Diffuse osseous demineralization with question chronic LEFT rotator cuff tear. Degenerative changes at RIGHT sternoclavicular joint. Degenerative disc disease changes and biconvex scoliosis thoracolumbar spine. No definite acute fractures. IMPRESSION: Slightly increased RIGHT pleural effusion and basilar atelectasis versus prior exam. Enlargement of cardiac silhouette. Aortic Atherosclerosis (ICD10-I70.0). Electronically Signed   By: Lavonia Dana M.D.   On: 10/08/2021 08:48    Pending Labs Unresulted Labs (From admission, onward)     Start     Ordered   10/09/21 2094  Basic metabolic panel  Daily at 5am,   R     Comments: As Scheduled for 5 days    10/08/21 1623   10/09/21 0500  CBC  Tomorrow morning,   R        10/08/21 1700   10/09/21 0500  Heparin level (unfractionated)  Daily at 5am,   R      10/08/21 1711   10/09/21 0500  APTT  Daily at 5am,   R      10/08/21 1711   10/09/21 0200  Heparin level (unfractionated)  Once-Timed,   TIMED        10/08/21 1711   10/09/21 0200  APTT  Once-Timed,   TIMED        10/08/21 1711   10/08/21 2000  Potassium  Once,   STAT        10/08/21 1543   10/08/21 1657  APTT  ONCE - STAT,   STAT        10/08/21 1656   10/08/21 1657  Heparin level (unfractionated)  ONCE - STAT,   STAT        10/08/21 1656   10/08/21 1657  Protime-INR  ONCE - STAT,   STAT        10/08/21 1656   10/08/21 1545  Magnesium  Add-on,   AD        10/08/21 1544   10/08/21 1545  Phosphorus  Add-on,   AD        10/08/21 1544            Vitals/Pain Today's Vitals   10/08/21 1915 10/08/21 1930  10/08/21 1945 10/08/21 2010  BP: (!) 148/112 (!) 153/90 (!) 152/105   Pulse: (!) 111 (!) 114 (!) 117   Resp: (!) 3 (!) 21 13   Temp:    98.4 F (36.9 C)  TempSrc:    Axillary  SpO2: 100% 98% 100%   Weight:      Height:      PainSc:        Isolation Precautions No active isolations  Medications Medications  potassium chloride SA (KLOR-CON M) CR tablet 40 mEq (40 mEq Oral Patient Refused/Not Given 10/08/21 1831)  atorvastatin (LIPITOR) tablet 10 mg (has no administration in time range)  metoprolol tartrate (LOPRESSOR) injection 5 mg (5 mg Intravenous Given 10/08/21 1835)  metoprolol tartrate (LOPRESSOR) tablet 100 mg (100 mg Oral Not Given 10/08/21 1855)  sodium chloride flush (NS) 0.9 % injection 3 mL (has no administration in time range)  sodium chloride flush (NS) 0.9 % injection 3 mL (has no administration in time range)  0.9 %  sodium chloride infusion (has no administration in time range)  ondansetron (ZOFRAN) injection 4 mg (has no administration in time range)  digoxin (LANOXIN) 0.25 MG/ML injection 0.125 mg (has no administration in time range)  heparin ADULT infusion 100 units/mL (25000 units/266m) (1,000 Units/hr Intravenous New Bag/Given 10/08/21 1851)  acetaminophen (TYLENOL) tablet 1,000 mg (has no administration in time range)  acetaminophen (TYLENOL) tablet 500 mg (has no administration in time range)  acidophilus (RISAQUAD) capsule 1 capsule (1 capsule Oral Not Given 10/08/21 1854)  potassium chloride 10 mEq in 100 mL IVPB (0 mEq Intravenous Stopped 10/08/21 2006)  HYDROmorphone (DILAUDID) injection 0.25 mg (0.25 mg Intravenous Given 10/08/21 1836)  enalaprilat (VASOTEC) injection 0.625 mg (has no administration in time range)  haloperidol lactate (HALDOL) injection 2 mg (has no administration in time range)  QUEtiapine (SEROQUEL) tablet 25 mg (0 mg Oral Hold 10/08/21 1855)  lidocaine-EPINEPHrine (XYLOCAINE W/EPI) 2 %-1:200000 (PF) injection 10 mL (10 mLs Infiltration  Given 10/08/21 0843)  potassium chloride 10 mEq in 100 mL IVPB (0 mEq Intravenous Stopped 10/08/21 1645)  iohexol (OMNIPAQUE) 300 MG/ML solution 75 mL (75 mLs Intravenous Contrast Given 10/08/21 1230)  haloperidol lactate (HALDOL) injection 2 mg (2 mg Intravenous Given 10/08/21 1834)    Mobility walks Moderate fall risk   Focused Assessments          Neuro Assessment:   Neuro Checks:      Last Documented NIHSS Modified Score:   Has TPA been given? No If patient is a Neuro Trauma and patient is going to OR before floor call report to [facility]: [phone] [phone]  R Recommendations: See Admitting Provider Note  Report given to:   Additional Notes:

## 2021-10-08 NOTE — ED Notes (Addendum)
Pt is confused, agitated and yelling for help while trying to get out of bed. Pt is currently wearing mits  I have reached out to doctor about restraint order and medicinal intervention.

## 2021-10-08 NOTE — ED Notes (Signed)
Pt is calm and resting at this time

## 2021-10-08 NOTE — ED Provider Notes (Signed)
[facility] EMERGENCY DEPARTMENT Provider Note   CSN: [id number] Arrival date & time: 10/08/21  3267     History  Chief Complaint  Patient presents with   Level 2   Judy [name] is a 88 y.o. female.   Fall  Patient presents with fall as a level 2 trauma.  Is on anticoagulation.  On Eliquis likely for atrial fibrillation and pulmonary embolism.  Reportedly did not take this morning.  Complaining of pain in her head.  Laceration above right eye.  Mechanical fall after  losing her balance while making her bed.  No loss conscious.  Does complain of some pain in left hip after fall a week ago.  Also some mild shoulder pain.  No chest or abdominal pain.  States it does hurt in her head.    Past Medical History:  Diagnosis Date   Blood transfusion    Breast cancer, left ([place]) 09/26/2011   Punch biopsies of the left nipple done on 09/16/2011.  ER and PR were negative.  HER2 is positive with ratio 4.68.Had mastectomy on 10/25/11. Path T1,N0.    Chronic low back pain    Dyslipidemia    GERD (gastroesophageal reflux disease)    HTN (hypertension)    Osteoarthritis    Osteoporosis   Previous history of atrial fibrillation and recent pulmonary embolism.  Home Medications Prior to Admission medications   Medication Sig Start Date End Date Taking? Authorizing Provider  acetaminophen (TYLENOL) 500 MG tablet Take 500-1,000 mg by mouth See admin instructions. 1000 mg every 12 hours + 500 mg every 6 hours PRN for fever 99.5-101, minor headache, minor discomfort.    [provider]  apixaban (ELIQUIS) 5 MG TABS tablet Take 2 tablets (10 mg total) by mouth 2 (two) times daily for 6 days, THEN 1 tablet (5 mg total) 2 (two) times daily for 24 days. 09/29/21 10/29/21  Amin, Jeanella Flattery, MD  atorvastatin (LIPITOR) 10 MG tablet Take 1 tablet (10 mg total) by mouth daily at 6 PM. Patient taking differently: Take 10 mg by mouth at bedtime. 08/03/21   Little Ishikawa, MD  diltiazem (CARDIZEM CD) 120 MG 24 hr capsule Take 1 capsule (120 mg total) by mouth daily. 08/03/21 08/03/22  Little Ishikawa, MD  hydrochlorothiazide (HYDRODIURIL) 25 MG tablet Take 25 mg by mouth daily.    [provider]  metoprolol succinate (TOPROL-XL) 50 MG 24 hr tablet Take 3 tablets (150 mg total) by mouth daily. Take with or immediately following a meal. 09/24/21   Amin, Jeanella Flattery, MD  Omega-3 Fatty Acids (OMEGA-3 PO) Take 1 capsule by mouth daily. Omega 3 - dha - epa - fish oil [phone] mg capsule    [provider]  potassium chloride SA (KLOR-CON M) 20 MEQ tablet Take 20 mEq by mouth daily.    [provider]  saccharomyces boulardii (FLORASTOR) 250 MG capsule Take 250 mg by mouth 2 (two) times daily.    [provider]      Allergies    Amoxil [amoxicillin], Codeine, Aricept [donepezil hcl], Catapres [clonidine], Hycodan [hydrocodone bit-homatrop mbr], Norco [hydrocodone-acetaminophen], Norvasc [amlodipine], Prolia [denosumab], Ultram [tramadol], Zoloft [sertraline], and Clavulanic acid    Review of Systems   Review of Systems  Constitutional:  Negative for activity change.  Hematological:  Bruises/bleeds easily.    Physical Exam Updated Vital Signs BP (!) 164/106   Pulse (!) 125   Temp 97.8 F (36.6 C) (  Oral)   Resp (!) 30   Ht _0  (1.626 m)   Wt 55.6 kg   SpO2 95%   BMI 21.04 kg/m  Physical Exam Vitals reviewed.  HENT:     Head:     Comments: Approximately 3 cm laceration above right eye.  Eye movements intact.  Face appears stable. Eyes:     Pupils: Pupils are equal, round, and reactive to light.  Neck:     Comments: No midline tenderness.  Cervical collar in place. Pulmonary:     Breath sounds: No wheezing or rhonchi.  Abdominal:     Tenderness: There is no abdominal tenderness.  Musculoskeletal:        General: No tenderness.     Comments: Initially had some tenderness on shoulders to palpation  bilaterally but on recheck no specific tenderness.  Skin:    Capillary Refill: Capillary refill takes less than 2 seconds.  Neurological:     Mental Status: She is alert and oriented to person, place, and time.   Tenderness to the left hip laterally.  Good range of motion.  Does have ecchymosis from previous fall.  ED Results / Procedures / Treatments   Labs (all labs ordered are listed, but only abnormal results are displayed) Labs Reviewed  BASIC METABOLIC PANEL - Abnormal; Notable for the following components:      Result Value   Potassium 2.8 (*)    Glucose, Bld 109 (*)    Calcium 8.7 (*)    All other components within normal limits  CBC - Abnormal; Notable for the following components:   RBC 3.43 (*)    Hemoglobin 10.5 (*)    HCT 32.2 (*)    RDW 17.5 (*)    Platelets 434 (*)    All other components within normal limits    EKG EKG Interpretation  Date/Time:  Friday October 08 2021 08:40:39 EDT Ventricular Rate:  110 PR Interval:    QRS Duration: 146 QT Interval:  373 QTC Calculation: 505 R Axis:   -58 Text Interpretation: Atrial fibrillation Ventricular premature complex RBBB and LAFB LVH with secondary repolarization abnormality No significant change since last tracing Confirmed by Davonna Belling [phone]) on 10/08/2021 8:49:10 AM  Radiology CT CHEST ABDOMEN PELVIS W CONTRAST  Result Date: 10/08/2021 CLINICAL DATA:  Blunt polytrauma EXAM: CT CHEST, ABDOMEN, AND PELVIS WITH CONTRAST TECHNIQUE: Multidetector CT imaging of the chest, abdomen and pelvis was performed following the standard protocol during bolus administration of intravenous contrast. RADIATION DOSE REDUCTION: This exam was performed according to the departmental dose-optimization program which includes automated exposure control, adjustment of the mA and/or kV according to patient size and/or use of iterative reconstruction technique. CONTRAST:  65m OMNIPAQUE IOHEXOL 300 MG/ML SOLN IV. No oral contrast.  COMPARISON:  CT angio chest 09/20/2021, CT abdomen and pelvis 10/04/2006 FINDINGS: CT CHEST FINDINGS Cardiovascular: Aorta normal caliber. Scattered atherosclerotic calcifications aorta in coronary arteries. Enlargement of cardiac chambers greatest at RIGHT atrium. No pericardial effusion. Pulmonary arteries adequately opacified. Large filling defect again identified within RIGHT pulmonary artery consistent with pulmonary embolism. Occlusive thrombus extends into RIGHT middle and RIGHT lower lobe pulmonary arteries. Smaller pulmonary emboli are seen in BILATERAL upper and LEFT lower lobe pulmonary arteries. These were all evident on the prior study as well. Mediastinum/Nodes: Esophagus unremarkable. Base of cervical region normal appearance. No thoracic adenopathy. Lungs/Pleura: Moderate RIGHT pleural effusion. Subsegmental atelectasis RIGHT lower and RIGHT middle lobes. Subpleural interstitial changes LEFT lung again seen. No new infiltrate or  pneumothorax. Musculoskeletal: Diffuse osseous demineralization. LEFT breast prosthesis. CT ABDOMEN PELVIS FINDINGS Hepatobiliary: Gallbladder and liver normal appearance Pancreas: Few scattered parenchymal calcifications. Atrophic. No definite mass. Spleen: Normal appearance Adrenals/Urinary Tract: Nonobstructing LEFT renal calculus. Adrenal glands, kidneys, ureters, and bladder otherwise normal appearance. Stomach/Bowel: Appendix not visualized. Scattered stool throughout colon. Uncomplicated sigmoid diverticula. Stomach and bowel loops otherwise unremarkable. Vascular/Lymphatic: Atherosclerotic calcifications aorta and iliac arteries without aneurysm. No adenopathy. Reproductive: Uterus surgically absent with nonvisualization of ovaries Other: No free air or free fluid.  No hernia. Musculoskeletal: Osseous demineralization with postsurgical changes of posterior L5-S1 fusion. IMPRESSION: BILATERAL pulmonary emboli as above with much larger clot burden on RIGHT, unchanged  since 09/20/2021, with persistent enlargement of cardiac chambers particularly RIGHT atrium. Moderate RIGHT pleural effusion with subsegmental atelectasis at RIGHT lung base. No acute intra-abdominal or intrapelvic abnormalities. Nonobstructing LEFT renal calculus. Uncomplicated sigmoid diverticulosis. Aortic Atherosclerosis (ICD10-I70.0). Findings discussed with Dr. Alvino Chapel on 10/08/2021 at 1249 hours. Electronically Signed   By: Lavonia Dana M.D.   On: 10/08/2021 12:49   CT HEAD WO CONTRAST (5MM)  Result Date: 10/08/2021 CLINICAL DATA:  Head trauma, minor (Age >= 65y); Facial trauma, blunt; Neck trauma (Age >= 65y) EXAM: CT HEAD WITHOUT CONTRAST CT MAXILLOFACIAL WITHOUT CONTRAST CT CERVICAL SPINE WITHOUT CONTRAST TECHNIQUE: Multidetector CT imaging of the head, cervical spine, and maxillofacial structures were performed using the standard protocol without intravenous contrast. Multiplanar CT image reconstructions of the cervical spine and maxillofacial structures were also generated. RADIATION DOSE REDUCTION: This exam was performed according to the departmental dose-optimization program which includes automated exposure control, adjustment of the mA and/or kV according to patient size and/or use of iterative reconstruction technique. COMPARISON:  None Available. FINDINGS: CT HEAD FINDINGS Brain: No evidence of acute infarction, hemorrhage, hydrocephalus, extra-axial collection or mass lesion/mass effect. Patchy white matter hypoattenuation, nonspecific but compatible with chronic microvascular ischemic disease. Cerebral atrophy. Vascular: No hyperdense vessel identified. Calcific intracranial atherosclerosis. Skull: No acute fracture. Other: No mastoid effusions. CT MAXILLOFACIAL FINDINGS Osseous: No fracture or mandibular dislocation. No destructive process. Orbits: Negative. No traumatic or inflammatory finding. Sinuses: Clear sinuses. Soft tissues: Negative. CT CERVICAL SPINE FINDINGS Alignment: Unchanged  alignment with similar anterolisthesis of C4 on C5 and C7 on T1. No new sagittal subluxation. Skull base and vertebrae: Vertebral body heights are maintained. No evidence of acute fracture. Soft tissues and spinal canal: No prevertebral fluid or swelling. No visible canal hematoma. Disc levels: Similar multilevel degenerative disc disease, moderate to severe. Similar multilevel facet and uncovertebral hypertrophy with varying degrees of neural foraminal stenosis. Upper chest: Partially imaged right pleural effusion. IMPRESSION: 1. No evidence of acute intracranial abnormality or facial fracture. 2. No evidence of acute fracture or traumatic malalignment in the cervical spine. 3. Partially imaged right pleural effusion. Electronically Signed   By: Margaretha Sheffield M.D.   On: 10/08/2021 09:07   CT Maxillofacial Wo Contrast  Result Date: 10/08/2021 CLINICAL DATA:  Head trauma, minor (Age >= 65y); Facial trauma, blunt; Neck trauma (Age >= 65y) EXAM: CT HEAD WITHOUT CONTRAST CT MAXILLOFACIAL WITHOUT CONTRAST CT CERVICAL SPINE WITHOUT CONTRAST TECHNIQUE: Multidetector CT imaging of the head, cervical spine, and maxillofacial structures were performed using the standard protocol without intravenous contrast. Multiplanar CT image reconstructions of the cervical spine and maxillofacial structures were also generated. RADIATION DOSE REDUCTION: This exam was performed according to the departmental dose-optimization program which includes automated exposure control, adjustment of the mA and/or kV according to patient size and/or use of iterative reconstruction  technique. COMPARISON:  None Available. FINDINGS: CT HEAD FINDINGS Brain: No evidence of acute infarction, hemorrhage, hydrocephalus, extra-axial collection or mass lesion/mass effect. Patchy white matter hypoattenuation, nonspecific but compatible with chronic microvascular ischemic disease. Cerebral atrophy. Vascular: No hyperdense vessel identified. Calcific  intracranial atherosclerosis. Skull: No acute fracture. Other: No mastoid effusions. CT MAXILLOFACIAL FINDINGS Osseous: No fracture or mandibular dislocation. No destructive process. Orbits: Negative. No traumatic or inflammatory finding. Sinuses: Clear sinuses. Soft tissues: Negative. CT CERVICAL SPINE FINDINGS Alignment: Unchanged alignment with similar anterolisthesis of C4 on C5 and C7 on T1. No new sagittal subluxation. Skull base and vertebrae: Vertebral body heights are maintained. No evidence of acute fracture. Soft tissues and spinal canal: No prevertebral fluid or swelling. No visible canal hematoma. Disc levels: Similar multilevel degenerative disc disease, moderate to severe. Similar multilevel facet and uncovertebral hypertrophy with varying degrees of neural foraminal stenosis. Upper chest: Partially imaged right pleural effusion. IMPRESSION: 1. No evidence of acute intracranial abnormality or facial fracture. 2. No evidence of acute fracture or traumatic malalignment in the cervical spine. 3. Partially imaged right pleural effusion. Electronically Signed   By: Margaretha Sheffield M.D.   On: 10/08/2021 09:07   CT Cervical Spine Wo Contrast  Result Date: 10/08/2021 CLINICAL DATA:  Head trauma, minor (Age >= 65y); Facial trauma, blunt; Neck trauma (Age >= 65y) EXAM: CT HEAD WITHOUT CONTRAST CT MAXILLOFACIAL WITHOUT CONTRAST CT CERVICAL SPINE WITHOUT CONTRAST TECHNIQUE: Multidetector CT imaging of the head, cervical spine, and maxillofacial structures were performed using the standard protocol without intravenous contrast. Multiplanar CT image reconstructions of the cervical spine and maxillofacial structures were also generated. RADIATION DOSE REDUCTION: This exam was performed according to the departmental dose-optimization program which includes automated exposure control, adjustment of the mA and/or kV according to patient size and/or use of iterative reconstruction technique. COMPARISON:  None  Available. FINDINGS: CT HEAD FINDINGS Brain: No evidence of acute infarction, hemorrhage, hydrocephalus, extra-axial collection or mass lesion/mass effect. Patchy white matter hypoattenuation, nonspecific but compatible with chronic microvascular ischemic disease. Cerebral atrophy. Vascular: No hyperdense vessel identified. Calcific intracranial atherosclerosis. Skull: No acute fracture. Other: No mastoid effusions. CT MAXILLOFACIAL FINDINGS Osseous: No fracture or mandibular dislocation. No destructive process. Orbits: Negative. No traumatic or inflammatory finding. Sinuses: Clear sinuses. Soft tissues: Negative. CT CERVICAL SPINE FINDINGS Alignment: Unchanged alignment with similar anterolisthesis of C4 on C5 and C7 on T1. No new sagittal subluxation. Skull base and vertebrae: Vertebral body heights are maintained. No evidence of acute fracture. Soft tissues and spinal canal: No prevertebral fluid or swelling. No visible canal hematoma. Disc levels: Similar multilevel degenerative disc disease, moderate to severe. Similar multilevel facet and uncovertebral hypertrophy with varying degrees of neural foraminal stenosis. Upper chest: Partially imaged right pleural effusion. IMPRESSION: 1. No evidence of acute intracranial abnormality or facial fracture. 2. No evidence of acute fracture or traumatic malalignment in the cervical spine. 3. Partially imaged right pleural effusion. Electronically Signed   By: Margaretha Sheffield M.D.   On: 10/08/2021 09:07   DG Pelvis Portable  Result Date: 10/08/2021 CLINICAL DATA:  Fall EXAM: PORTABLE PELVIS 1-2 VIEWS COMPARISON:  Portable exam 0833 hours compared to 08/13/2021 FINDINGS: Osseous demineralization. BILATERAL hip joint narrowing and spur formation. SI joints preserved. Prior L5-S1 fusion. No acute fracture, dislocation, or bone destruction. Atherosclerotic calcifications at the femoral arteries bilaterally. Significant lateral soft tissue swelling at the level of the  LEFT hip and proximal LEFT femur. IMPRESSION: No acute osseous abnormalities. Degenerative changes of BILATERAL hip  joints. Electronically Signed   By: Lavonia Dana M.D.   On: 10/08/2021 08:50   DG Chest Portable 1 View  Result Date: 10/08/2021 CLINICAL DATA:  Fall EXAM: PORTABLE CHEST 1 VIEW COMPARISON:  Portable exam 0832 hours compared to 09/20/2021 FINDINGS: Enlargement of cardiac silhouette. Atherosclerotic calcification aorta. Mediastinal contours and pulmonary vascularity otherwise normal. Small RIGHT pleural effusion with RIGHT basilar atelectasis, minimally increased from previous exam. No acute infiltrate or pneumothorax. Diffuse osseous demineralization with question chronic LEFT rotator cuff tear. Degenerative changes at RIGHT sternoclavicular joint. Degenerative disc disease changes and biconvex scoliosis thoracolumbar spine. No definite acute fractures. IMPRESSION: Slightly increased RIGHT pleural effusion and basilar atelectasis versus prior exam. Enlargement of cardiac silhouette. Aortic Atherosclerosis (ICD10-I70.0). Electronically Signed   By: Lavonia Dana M.D.   On: 10/08/2021 08:48    Procedures .Marland KitchenLaceration Repair  Date/Time: 10/08/2021 1:00 PM  Performed by: Davonna Belling, MD Authorized by: Davonna Belling, MD   Consent:    Consent obtained:  Verbal   Consent given by:  Patient   Risks, benefits, and alternatives were discussed: yes     Risks discussed:  Infection, pain, retained foreign body, poor cosmetic result, need for additional repair and nerve damage   Alternatives discussed:  No treatment Anesthesia:    Anesthesia method:  Local infiltration   Local anesthetic:  Lidocaine 2% WITH epi Laceration details:    Location:  Face   Length (cm):  3 Pre-procedure details:    Preparation:  Patient was prepped and draped in usual sterile fashion and imaging obtained to evaluate for foreign bodies Exploration:    Limited defect created (wound extended): no      Hemostasis achieved with:  Epinephrine   Contaminated: no   Treatment:    Area cleansed with:  Saline   Amount of cleaning:  Standard Skin repair:    Repair method:  Sutures   Suture size:  4-0   Wound skin closure material used: vicryl rapide.   Suture technique:  Simple interrupted   Number of sutures:  6 Repair type:    Repair type:  Simple Post-procedure details:    Dressing:  Open (no dressing)   Procedure completion:  Tolerated well, no immediate complications     Medications Ordered in ED Medications  lidocaine-EPINEPHrine (XYLOCAINE W/EPI) 2 %-1:200000 (PF) injection 10 mL (10 mLs Infiltration Given 10/08/21 0843)  potassium chloride 10 mEq in 100 mL IVPB (10 mEq Intravenous New Bag/Given 10/08/21 1209)  iohexol (OMNIPAQUE) 300 MG/ML solution 75 mL (75 mLs Intravenous Contrast Given 10/08/21 1230)    ED Course/ Medical Decision Making/ A&P                           Medical Decision Making Amount and/or Complexity of Data Reviewed Labs: ordered. Radiology: ordered.  Risk Prescription drug management.   Patient presents after a fall.  Brought in as a level 2 trauma since on anticoagulation.  Recent pulmonary embolism and recent atrial fibrillation.  Hit head and has laceration.  Closed.  Head CT done and reassuring.  However hemoglobin is dropped.  CT scan done to evaluate on the pleural effusion on the x-ray.  Independently interpreted the CT and the x-ray and does show similar sized pleural effusion still has large pulm embolisms.  Does have a hypokalemia that is being supplemented IV.  However developed more of a tachycardia.  Is in A-fib and potentially did not had all her medicines with the fall  but now persistently tachycardic.  I think with persistent tachycardia will require admission to the hospital.  Will discuss with hospitalist.  CRITICAL CARE Performed by: Davonna Belling Total critical care time: 30 minutes Critical care time was exclusive of separately  billable procedures and treating other patients. Critical care was necessary to treat or prevent imminent or life-threatening deterioration. Critical care was time spent personally by me on the following activities: development of treatment plan with patient and/or surrogate as well as nursing, discussions with consultants, evaluation of patient's response to treatment, examination of patient, obtaining history from patient or surrogate, ordering and performing treatments and interventions, ordering and review of laboratory studies, ordering and review of radiographic studies, pulse oximetry and re-evaluation of patient's condition.          Final Clinical Impression(s) / ED Diagnoses Final diagnoses:  Fall, initial encounter  Atrial fibrillation with rapid ventricular response ([place])  Face lacerations, initial encounter  Pulmonary embolism, unspecified chronicity, unspecified pulmonary embolism type, unspecified whether acute cor pulmonale present [facility])    Rx / DC Orders ED Discharge Orders     None         Davonna Belling, MD 10/08/21 1527

## 2021-10-08 NOTE — Progress Notes (Signed)
   10/08/21 0835  Clinical Encounter Type  Visited With Patient;Health care provider  Visit Type ED;Trauma;Initial  Referral From Nurse  Consult/Referral To Chaplain Melvenia Beam)  Recommendations Level 2 Trauma  Spiritual Encounters  Spiritual Needs Emotional   0900: Me with  Judy [name] as she was returning from [place]. Patient shared that while making the bed this morning that she fell striking her head. Patient said that her husband was "on-the-job" and probably would not be coming in. [name] seems to be carrying some sadness regarding the deaths of her parents and grandson. Chaplain departed room in order that nursing care could resume with Alison Murray, RN.[address] [place], [place] [phone]

## 2021-10-08 NOTE — ED Notes (Signed)
IV team arrived and we found pt to have removed her mits and was messing with her one good IV.  Pt now has a 22G in left hand if needed in addition to other IV with mits in place.  She continues to try to get them off and get up when she thinks Im not looking

## 2021-10-08 NOTE — Progress Notes (Signed)
ANTICOAGULATION CONSULT NOTE - Initial Consult  Pharmacy Consult for Heparin Indication: atrial fibrillation & PE/DVT  Allergies  Allergen Reactions   Amoxil [Amoxicillin] Nausea Only   Codeine Nausea Only   Aricept [Donepezil Hcl] Other (See Comments)    "Felt drunk" Headaches Drowsiness   Catapres [Clonidine] Other (See Comments)    Dry mouth   Hycodan [Hydrocodone Bit-Homatrop Mbr] Diarrhea and Nausea And Vomiting   Norco [Hydrocodone-Acetaminophen] Diarrhea and Nausea And Vomiting   Norvasc [Amlodipine] Swelling   Prolia [Denosumab] Other (See Comments)    Unknown reaction   Ultram [Tramadol] Nausea Only   Zoloft [Sertraline] Nausea Only   Clavulanic Acid Hives and Rash    Patient Measurements: Height: '5\' 4"'  (162.6 cm) Weight: 55.6 kg (122 lb 9.2 oz) IBW/kg (Calculated) : 54.7 Heparin Dosing Weight: 55.6 kg  Vital Signs: Temp: 97.8 F (36.6 C) (08/25 1359) Temp Source: Oral (08/25 1359) BP: 160/102 (08/25 1545) Pulse Rate: 129 (08/25 1545)  Labs: Recent Labs    10/08/21 0839  HGB 10.5*  HCT 32.2*  PLT 434*  CREATININE 0.69    Estimated Creatinine Clearance: 41.2 mL/min (by C-G formula based on SCr of 0.69 mg/dL).   Medical History: Past Medical History:  Diagnosis Date   Blood transfusion    Breast cancer, left ([place]) 09/26/2011   Punch biopsies of the left nipple done on 09/16/2011.  ER and PR were negative.  HER2 is positive with ratio 4.68.Had mastectomy on 10/25/11. Path T1,N0.    Chronic low back pain    Dyslipidemia    GERD (gastroesophageal reflux disease)    HTN (hypertension)    Osteoarthritis    Osteoporosis     Medications:  (Not in a hospital admission)  Scheduled:   acetaminophen  500-1,000 mg Oral See admin instructions   atorvastatin  10 mg Oral QHS   digoxin  0.125 mg Intravenous Q6H   metoprolol tartrate  100 mg Oral BID   potassium chloride  40 mEq Oral Q2H   saccharomyces boulardii  250 mg Oral BID   sodium chloride flush   3 mL Intravenous Q12H   Infusions:   sodium chloride     PRN: sodium chloride, metoprolol tartrate, ondansetron (ZOFRAN) IV, sodium chloride flush  Assessment: 31 yof with a history of diagnosed acute bilateral segmental PE and acute right popliteal DVT on Eliquis, PAF, HTN, chronic HFrEF. Patient is presenting from NH for a fall. Heparin per pharmacy consult placed for  atrial fibrillation & PE/DVT .  Patient is on apixaban prior to arrival. Last dose 8/24 pm. Will require aPTT monitoring due to likely falsely high anti-Xa level secondary to DOAC use.  CT Head w/o hemorrhage  Hgb 10.5; plt 434  Goal of Therapy:  Heparin level 0.3-0.7 units/ml aPTT 66-102 seconds Monitor platelets by anticoagulation protocol: Yes   Plan:  Baseline coags ordered No initial heparin bolus Start heparin infusion at 1000 units/hr Check aPTT & anti-Xa level in 8 hours and daily while on heparin Continue to monitor via aPTT until levels are correlated Continue to monitor H&H and platelets  Lorelei Pont, PharmD, BCPS 10/08/2021 4:24 PM ED Clinical Pharmacist -  [phone]

## 2021-10-08 NOTE — ED Triage Notes (Signed)
Pt arrived via [facility] from [facility] with c/c of Fall on thinners. Per EMS mechanical fall while making bed. Hit head on wall while making bed. Pt on eliquis. Compliant some dizziness which started after fall. Previous fall last week with left hip bruising and shoulder. Hx of a-fib & HTN. Follow command & A&O x4. Pt arrived having approx. 1inch laceration to right forehead   100-120HR, 150/110, CBG 114, 98%RA

## 2021-10-08 NOTE — ED Notes (Signed)
Pt transported to CT ?

## 2021-10-08 NOTE — ED Notes (Addendum)
Pt arrived via [facility] from [facility] with c/c of Fall on thinners. Per EMS mechanical fall while making bed. Hit head on wall while making bed. Pt on eliquis. Compliant some dizziness which started after fall. Previous fall last week with left hip bruising and shoulder. Hx of a-fib & HTN. Follow command & A&O x4. Pt arrived having approx. 1inch laceration to right forehead   100-120HR, 150/110, CBG 114, 98%RA

## 2021-10-09 ENCOUNTER — Other Ambulatory Visit (HOSPITAL_COMMUNITY): Payer: Medicare Other

## 2021-10-09 DIAGNOSIS — J90 Pleural effusion, not elsewhere classified: Secondary | ICD-10-CM

## 2021-10-09 DIAGNOSIS — F0390 Unspecified dementia without behavioral disturbance: Secondary | ICD-10-CM

## 2021-10-09 DIAGNOSIS — I5020 Unspecified systolic (congestive) heart failure: Secondary | ICD-10-CM

## 2021-10-09 DIAGNOSIS — R627 Adult failure to thrive: Secondary | ICD-10-CM | POA: Diagnosis not present

## 2021-10-09 DIAGNOSIS — I480 Paroxysmal atrial fibrillation: Secondary | ICD-10-CM | POA: Diagnosis not present

## 2021-10-09 LAB — CBC
HCT: 31.9 % — ABNORMAL LOW (ref 36.0–46.0)
Hemoglobin: 10.5 g/dL — ABNORMAL LOW (ref 12.0–15.0)
MCH: 30.1 pg (ref 26.0–34.0)
MCHC: 32.9 g/dL (ref 30.0–36.0)
MCV: 91.4 fL (ref 80.0–100.0)
Platelets: 416 10*3/uL — ABNORMAL HIGH (ref 150–400)
RBC: 3.49 MIL/uL — ABNORMAL LOW (ref 3.87–5.11)
RDW: 17.4 % — ABNORMAL HIGH (ref 11.5–15.5)
WBC: 8.4 10*3/uL (ref 4.0–10.5)
nRBC: 0 % (ref 0.0–0.2)

## 2021-10-09 LAB — BASIC METABOLIC PANEL
Anion gap: 12 (ref 5–15)
BUN: 11 mg/dL (ref 8–23)
CO2: 20 mmol/L — ABNORMAL LOW (ref 22–32)
Calcium: 8.5 mg/dL — ABNORMAL LOW (ref 8.9–10.3)
Chloride: 101 mmol/L (ref 98–111)
Creatinine, Ser: 0.76 mg/dL (ref 0.44–1.00)
GFR, Estimated: >60 mL/min (ref >60)
Glucose, Bld: 110 mg/dL — ABNORMAL HIGH (ref 70–99)
Potassium: 4.3 mmol/L (ref 3.5–5.1)
Sodium: 133 mmol/L — ABNORMAL LOW (ref 135–145)

## 2021-10-09 LAB — APTT
aPTT: 125 seconds — ABNORMAL HIGH (ref 24–36)
aPTT: 65 seconds — ABNORMAL HIGH (ref 24–36)

## 2021-10-09 LAB — HEPARIN LEVEL (UNFRACTIONATED): Heparin Unfractionated: >1.1 IU/mL — ABNORMAL HIGH (ref 0.30–0.70)

## 2021-10-09 LAB — GLUCOSE, CAPILLARY: Glucose-Capillary: 111 mg/dL — ABNORMAL HIGH (ref 70–99)

## 2021-10-09 MED ORDER — HEPARIN (PORCINE) 25000 UT/250ML-% IV SOLN
750.0000 [IU]/h | INTRAVENOUS | Status: DC
  Administered 2021-10-09: 700 [IU]/h via INTRAVENOUS
  Administered 2021-10-09: 750 [IU]/h via INTRAVENOUS
  Filled 2021-10-09: qty 250

## 2021-10-09 MED ORDER — SENNOSIDES-DOCUSATE SODIUM 8.6-50 MG PO TABS
1.0000 | ORAL_TABLET | Freq: Two times a day (BID) | ORAL | Status: DC
Start: 2021-10-09 — End: 2021-10-13
  Administered 2021-10-09 – 2021-10-12 (×4): 1 via ORAL
  Filled 2021-10-09 (×5): qty 1

## 2021-10-09 MED ORDER — DILTIAZEM HCL-DEXTROSE 125-5 MG/125ML-% IV SOLN (PREMIX)
5.0000 mg/h | INTRAVENOUS | Status: DC
  Administered 2021-10-09: 10 mg/h via INTRAVENOUS
  Administered 2021-10-09: 5 mg/h via INTRAVENOUS
  Filled 2021-10-09 (×5): qty 125

## 2021-10-09 MED ORDER — DILTIAZEM LOAD VIA INFUSION
10.0000 mg | Freq: Once | INTRAVENOUS | Status: AC
  Administered 2021-10-09: 10 mg via INTRAVENOUS
  Filled 2021-10-09: qty 10

## 2021-10-09 MED ORDER — ENSURE ENLIVE PO LIQD
237.0000 mL | Freq: Two times a day (BID) | ORAL | Status: DC
  Administered 2021-10-12: 237 mL via ORAL

## 2021-10-09 NOTE — Progress Notes (Signed)
HOSPITAL MEDICINE OVERNIGHT EVENT NOTE    Notified by nursing that patient continues to be quite agitated and confused throughout the afternoon and evening.  Patient has been in restraints.  Reports that due to patient's continued agitation and combativeness he is continue to place himself and staff at risk.  Patient is not following commands despite multiple attempts at redirection by staff.  Patient is already receiving as needed Haldol.  Unfortunately there is no sitter available.  For the safety of the patient and staff restraints will have to be continued at this time.  We will discontinue as soon as patient is able to safely have them removed.  Judy Emerald  MD [facility]

## 2021-10-09 NOTE — Progress Notes (Addendum)
PROGRESS NOTE    Judy [name]  FIE:[id number] DOB: [birth year] DOA: 10/08/2021 PCP: Mayra Neer, MD     Brief Narrative:  H/o afib, systolic chf ( diagnosed in 07/2021), bilateral PE/RLE DVT ( diagnosed in 09/2021), sent from ALF due to fall, Hit head on wall while trying to help making bed at her facility, 1inch laceration to right forehead, sutured in the ED, found to be in afib/RVR , admitted to the hospital   Subjective:  Sitting up in chair, demented, not able to provide history, but does not appear to be in pain, no agitation,  Appear dehydrated In afib/rvr  Assessment & Plan:  Active Problems:   Hypokalemia   Pulmonary emboli (HCC)   Paroxysmal atrial fibrillation with RVR (HCC)   Hypomagnesemia   Dementia without behavioral disturbance (HCC)   FTT (failure to thrive) in adult    Assessment and Plan: No notes have been filed under this hospital service. Service: Hospitalist   Afib/rvr Systolic chf, appear dehydrated  -unclear cause, start on cardizem drip for rate control, continue heparin drip -cardiology consulted   Hypok/mag Lasix and hctz on home med list Also has poor Oral intake Replace k/mag  Right pleural effusion with h/o right side breast cancer Clinically appear dry, no apparent malignancy on imaging obtained in the ED Order diagnotic and therapeutic thoracentesis   Bilateral PE/RLE DVT , diagnosed several weeks ago On room air On anticoagulation     Anemia No overt blood loss Check iron, b12, fobt Repeat cbc    FTT/ increased falls from 07/2021, poor appetite, ongoing weight loss -fall precaution, ensure, consider Marinol vs remeron for appetite booster ( family will discuss it) -CT abdomen chest pelvis, CT cervical spine, CT maxillofacial, CT of the head no acute findings -Seen by PT?OT recommended SNF-overall Poor prognosis  Right forehead laceration Status post suture in the ED on 8/25, sutures need to be removed in 5 days . I  have Reviewed nursing notes, Vitals, pain scores, I/o's, Lab results and  imaging results since pt's last encounter, details please see discussion above  I ordered the following labs:  Unresulted Labs (From admission, onward)     Start     Ordered   10/10/21 0500  Iron and TIBC  Tomorrow morning,   R       Question:  Specimen collection method  Answer:  Lab=Lab collect   10/09/21 1020   10/10/21 0500  CBC with Differential/Platelet  Tomorrow morning,   R       Question:  Specimen collection method  Answer:  Lab=Lab collect   10/09/21 1021   10/10/21 0500  Magnesium  Tomorrow morning,   R       Question:  Specimen collection method  Answer:  Lab=Lab collect   10/09/21 1021   10/10/21 0500  Vitamin B12  Tomorrow morning,   R       Question:  Specimen collection method  Answer:  Lab=Lab collect   10/09/21 1022   10/09/21 2000  APTT  Once-Timed,   TIMED       Question:  Specimen collection method  Answer:  Lab=Lab collect   10/09/21 1101   10/09/21 1660  Basic metabolic panel  Daily at 5am,   R     Comments: As Scheduled for 5 days    10/08/21 1623   10/09/21 0500  Heparin level (unfractionated)  Daily at 5am,   R      10/08/21 1711   10/09/21 0500  APTT  Daily at 5am,   R      10/08/21 1711   Unscheduled  Occult blood card to lab, stool  As needed,   R      10/09/21 1021             DVT prophylaxis:    Code Status:   Code Status: DNR  Family Communication: daughter in law over the phone Disposition:    Dispo: The patient is from: ALF              Anticipated d/c is to: ALF vs SNF              Anticipated d/c date is: early next week  Antimicrobials:    Anti-infectives (From admission, onward)    None          Objective: Vitals:   10/09/21 0607 10/09/21 0734 10/09/21 1048 10/09/21 1152  BP:  (!) 118/90  111/81  Pulse:  (!) 139 88 70  Resp:  (!) 21    Temp:  98 F (36.7 C)    TempSrc:  Oral    SpO2:  95%  100%  Weight: 54.7 kg     Height:         Intake/Output Summary (Last 24 hours) at 10/09/2021 1441 Last data filed at 10/09/2021 0437 Gross per 24 hour  Intake --  Output 400 ml  Net -400 ml   Filed Weights   10/08/21 0826 10/08/21 2129 10/09/21 0607  Weight: 55.6 kg 53.7 kg 54.7 kg    Examination:  General exam: frail elderly , sitting in chair, alert, awake, demented, follow commands, no agitation  Respiratory system: diminished right lower side, no wheezing, no rales, no rhonchi. Respiratory effort normal. Cardiovascular system:  IRRR.  Gastrointestinal system: Abdomen is nondistended, soft and nontender.  Normal bowel sounds heard. Central nervous system: Alert and oriented to self. Extremities:  no edema Skin: No rashes, lesions or ulcers Psychiatry: Demented no agitation    Data Reviewed: I have personally reviewed  labs and visualized  imaging studies since the last encounter and formulate the plan        Scheduled Meds:  acetaminophen  1,000 mg Oral BID   acidophilus  1 capsule Oral Daily   atorvastatin  10 mg Oral QHS   metoprolol tartrate  100 mg Oral BID   QUEtiapine  25 mg Oral QHS   senna-docusate  1 tablet Oral BID   sodium chloride flush  3 mL Intravenous Q12H   Continuous Infusions:  sodium chloride     diltiazem (CARDIZEM) infusion 10 mg/hr (10/09/21 1019)   heparin 700 Units/hr (10/09/21 1207)     LOS: 1 day     Florencia Reasons, MD PhD FACP [facility]  Available via Epic secure chat 7am-7pm for nonurgent issues Please page for urgent issues To page the attending provider between 7A-7P or the covering provider during after hours 7P-7A, please log into the web site [email or web address] and access using universal [place] password for that web site. If you do not have the password, please call the hospital operator.    10/09/2021, 2:41 PM

## 2021-10-09 NOTE — Progress Notes (Signed)
Admitted to 2C05 from ED, on Heparin at 1000 units/hr. Alert to self only, physically combative with staff. Bilateral mitts in place 2/2 pulling at invasive lines and tubing. Initially refusing care and meds but then agreeable to take meds with water 2/2 pain in L shoulder. No swallowing issues noted. Assessment completed and documented. Large dark bruise and soft mass noted to L hip/thigh, red bruise to right eye and laceration with 5 sutures noted over right eyebrow. Also has diffuse erythema and petechiae to BLE. Per patient, she had a fall last week and a fall several days ago. Information regarding falls confirmed by daughter in law, Doris. Increased agitation and confusion throughout the night. Repeated attempts to pull at lines and tubes; hit, kick, and bite staff; and climb from the bed. BSWR in place for patient and staff safety.   Orders for timed APTT and Heparin level scheduled for 0500. Not yet completed. Phlebotomy contacted x 3. No answer or VM available. Lab on floor at [phone] for lab draw.

## 2021-10-09 NOTE — Consult Note (Signed)
CONSULTATION NOTE   Patient Name: Judy [name] Date of Encounter: 10/09/2021 Cardiologist: None Electrophysiologist: None Advanced Heart Failure: None   Chief Complaint   A-fib with RVR  Patient Profile   88 yo female with afib and RVR as well as history of chronic systolic CHF with LVEF 16-10%  HPI   Judy [name] is a 88 y.o. female who is being seen today for the evaluation of afib/CHF at the request of Dr. Erlinda Hong. This is an 88 year old female, previously followed by Dr. Irish Lack, last seen in 2014.  She has history of hypertension and more recently was noted to have systolic heart failure with LVEF 40 to 45% by echo in June 2023.  Echo demonstrated global hypokinesis.  RVSP was elevated at 44 mmHg.  Left atrium was moderately dilated.  She has also had atrial fibrillation on Eliquis, presumably managed by her PCP.  She also had recently diagnosed acute bilateral subsegmental PE and right popliteal DVT for which the Eliquis is also indicated.  She was sent from a nursing home for mechanical fall.  In the ER she was found to be in rapid A-fib.  She was switched to heparin temporarily.  She was noted to have a drop in hemoglobin during her recent hospitalization, but appears stable today.  PMHx   Past Medical History:  Diagnosis Date   Blood transfusion    Breast cancer, left ([place]) 09/26/2011   Punch biopsies of the left nipple done on 09/16/2011.  ER and PR were negative.  HER2 is positive with ratio 4.68.Had mastectomy on 10/25/11. Path T1,N0.    Chronic low back pain    Dyslipidemia    GERD (gastroesophageal reflux disease)    HTN (hypertension)    Osteoarthritis    Osteoporosis     Past Surgical History:  Procedure Laterality Date   BACK SURGERY  2009   lumb lam   BREAST SURGERY  2009   lt lumpectomy-plus 2 re-excisions for margins   left breast mastectomy  2013   MASTECTOMY Left    ROTATOR CUFF REPAIR     Bilateral, 2010   TOTAL ABDOMINAL HYSTERECTOMY      FAMHx    Family History  Problem Relation Age of Onset   Cancer Sister        breast   Breast cancer Sister    Colon cancer Brother    Cancer Sister        lung   Diabetes Son    Esophageal cancer Neg Hx    Rectal cancer Neg Hx    Stomach cancer Neg Hx     SOCHx    reports that she has never smoked. She has never used smokeless tobacco. She reports that she does not drink alcohol and does not use drugs.  Outpatient Medications   No current facility-administered medications on file prior to encounter.   Current Outpatient Medications on File Prior to Encounter  Medication Sig Dispense Refill   acetaminophen (TYLENOL) 500 MG tablet Take 1,000 mg by mouth every 12 (twelve) hours. Taking every 12 hours per MAR     aluminum-magnesium hydroxide-simethicone (MAALOX) 960-454-09 MG/5ML SUSP Take 30 mLs by mouth daily as needed (for constipation).     apixaban (ELIQUIS) 5 MG TABS tablet Take 5 mg by mouth 2 (two) times daily.     atorvastatin (LIPITOR) 10 MG tablet Take 1 tablet (10 mg total) by mouth daily at 6 PM. (Patient taking differently: Take 10 mg by mouth at  bedtime.) 30 tablet 1   diltiazem (CARDIZEM CD) 120 MG 24 hr capsule Take 1 capsule (120 mg total) by mouth daily. 30 capsule 1   furosemide (LASIX) 20 MG tablet Take 20 mg by mouth See admin instructions. Take 1 tablet by mouth daily  for 3 days     guaifenesin (ROBITUSSIN) 100 MG/5ML syrup Take 200 mg by mouth every 6 (six) hours as needed for cough.     hydrochlorothiazide (HYDRODIURIL) 25 MG tablet Take 25 mg by mouth daily.     ibuprofen (ADVIL) 200 MG tablet Take 200 mg by mouth every 6 (six) hours as needed for mild pain.     lisinopril (ZESTRIL) 10 MG tablet Take 10 mg by mouth daily.     loperamide (IMODIUM) 2 MG capsule Take 2 mg by mouth daily as needed for diarrhea or loose stools.     metoprolol succinate (TOPROL-XL) 50 MG 24 hr tablet Take 3 tablets (150 mg total) by mouth daily. Take with or immediately following a  meal. 30 tablet 0   neomycin-bacitracin-polymyxin (NEOSPORIN) [phone] ointment Apply 1 Application topically daily as needed (For minor skin tear).     Omega-3 Fatty Acids (OMEGA-3 PO) Take 1 capsule by mouth daily.     potassium chloride SA (KLOR-CON M) 20 MEQ tablet Take 20 mEq by mouth daily.     saccharomyces boulardii (FLORASTOR) 250 MG capsule Take 250 mg by mouth daily.     traMADol (ULTRAM) 50 MG tablet Take 25 mg by mouth 2 (two) times daily.     apixaban (ELIQUIS) 5 MG TABS tablet Take 2 tablets (10 mg total) by mouth 2 (two) times daily for 6 days, THEN 1 tablet (5 mg total) 2 (two) times daily for 24 days. (Patient not taking: Reported on 10/08/2021) 72 tablet 0    Inpatient Medications    Scheduled Meds:  acetaminophen  1,000 mg Oral BID   acidophilus  1 capsule Oral Daily   atorvastatin  10 mg Oral QHS   metoprolol tartrate  100 mg Oral BID   QUEtiapine  25 mg Oral QHS   sodium chloride flush  3 mL Intravenous Q12H    Continuous Infusions:  sodium chloride     diltiazem (CARDIZEM) infusion 5 mg/hr (10/09/21 0957)   heparin 1,000 Units/hr (10/08/21 1851)    PRN Meds: sodium chloride, acetaminophen, enalaprilat, haloperidol lactate, HYDROmorphone (DILAUDID) injection, metoprolol tartrate, ondansetron (ZOFRAN) IV, sodium chloride flush   ALLERGIES   Allergies  Allergen Reactions   Amoxil [Amoxicillin] Nausea Only   Codeine Nausea Only   Aricept [Donepezil Hcl] Other (See Comments)    "Felt drunk" Headaches Drowsiness   Catapres [Clonidine] Other (See Comments)    Dry mouth   Hycodan [Hydrocodone Bit-Homatrop Mbr] Diarrhea and Nausea And Vomiting   Norco [Hydrocodone-Acetaminophen] Diarrhea and Nausea And Vomiting   Norvasc [Amlodipine] Swelling   Prolia [Denosumab] Other (See Comments)    Unknown reaction   Ultram [Tramadol] Nausea Only   Zoloft [Sertraline] Nausea Only   Clavulanic Acid Hives and Rash    ROS   Pertinent items noted in HPI and  remainder of comprehensive ROS otherwise negative.  Vitals   Vitals:   10/08/21 2342 10/09/21 0436 10/09/21 0607 10/09/21 0734  BP:  (!) 154/99  (!) 118/90  Pulse: (!) 111 (!) 106  (!) 139  Resp:  14  (!) 21  Temp: (!) 97.4 F (36.3 C) 97.8 F (36.6 C)  98 F (36.7 C)  TempSrc: Axillary Axillary  Oral  SpO2:  96%  95%  Weight:   54.7 kg   Height:        Intake/Output Summary (Last 24 hours) at 10/09/2021 2585 Last data filed at 10/09/2021 2778 Gross per 24 hour  Intake --  Output 400 ml  Net -400 ml   Filed Weights   10/08/21 0826 10/08/21 2129 10/09/21 0607  Weight: 55.6 kg 53.7 kg 54.7 kg    Physical Exam   General appearance: alert, no distress, and mitts in place Neck: no carotid bruit, no JVD, and thyroid not enlarged, symmetric, no tenderness/mass/nodules Lungs: diminished breath sounds bibasilar Heart: irregularly irregular rhythm and tachycardic Abdomen: soft, non-tender; bowel sounds normal; no masses,  no organomegaly Extremities: extremities normal, atraumatic, no cyanosis or edema Pulses: 2+ and symmetric Skin: Skin color, texture, turgor normal. No rashes or lesions Neurologic: Mental status: awake, but confused Psych: Pleasant  Labs   Results for orders placed or performed during the hospital encounter of 10/08/21 (from the past 48 hour(s))  Basic metabolic panel     Status: Abnormal   Collection Time: 10/08/21  8:39 AM  Result Value Ref Range   Sodium 136 135 - 145 mmol/L   Potassium 2.8 (L) 3.5 - 5.1 mmol/L   Chloride 99 98 - 111 mmol/L   CO2 28 22 - 32 mmol/L   Glucose, Bld 109 (H) 70 - 99 mg/dL    Comment: Glucose reference range applies only to samples taken after fasting for at least 8 hours.   BUN 11 8 - 23 mg/dL   Creatinine, Ser 0.69 0.44 - 1.00 mg/dL   Calcium 8.7 (L) 8.9 - 10.3 mg/dL   GFR, Estimated >60 >60 mL/min    Comment: (NOTE) Calculated using the CKD-EPI Creatinine Equation (2021)    Anion gap 9 5 - 15    Comment:  Performed at [facility] [address], [place], [place] [address]  CBC     Status: Abnormal   Collection Time: 10/08/21  8:39 AM  Result Value Ref Range   WBC 8.0 4.0 - 10.5 K/uL   RBC 3.43 (L) 3.87 - 5.11 MIL/uL   Hemoglobin 10.5 (L) 12.0 - 15.0 g/dL   HCT 32.2 (L) 36.0 - 46.0 %   MCV 93.9 80.0 - 100.0 fL   MCH 30.6 26.0 - 34.0 pg   MCHC 32.6 30.0 - 36.0 g/dL   RDW 17.5 (H) 11.5 - 15.5 %   Platelets 434 (H) 150 - 400 K/uL   nRBC 0.0 0.0 - 0.2 %    Comment: Performed at [facility], [place] [address], [place], [place] [address]  Potassium     Status: Abnormal   Collection Time: 10/08/21  8:47 PM  Result Value Ref Range   Potassium 3.0 (L) 3.5 - 5.1 mmol/L    Comment: Performed at [facility] [address], [place], [place] [address]  Magnesium     Status: Abnormal   Collection Time: 10/08/21  8:47 PM  Result Value Ref Range   Magnesium 1.3 (L) 1.7 - 2.4 mg/dL    Comment: Performed at [facility] [address] [place], [place] [address]  Phosphorus     Status: None   Collection Time: 10/08/21  8:47 PM  Result Value Ref Range   Phosphorus 2.7 2.5 - 4.6 mg/dL    Comment: Performed at [facility], [place] [address] [place], [place] [address]  APTT  Status: Abnormal   Collection Time: 10/08/21  8:47 PM  Result Value Ref Range   aPTT 72 (H) 24 - 36 seconds    Comment:        IF BASELINE aPTT IS ELEVATED, SUGGEST PATIENT RISK ASSESSMENT BE USED TO DETERMINE APPROPRIATE ANTICOAGULANT THERAPY. Performed at [facility], [place] [address] [place], [place] [address]   Heparin level (unfractionated)     Status: Abnormal   Collection Time: 10/08/21  8:47 PM  Result Value Ref Range   Heparin Unfractionated >1.10 (H) 0.30 - 0.70 IU/mL    Comment: (NOTE) The clinical reportable range upper limit is being lowered to >1.10 to align with the FDA approved guidance for the current laboratory assay.  If heparin results are below expected  values, and patient dosage has  been confirmed, suggest follow up testing of antithrombin III levels. Performed at [facility], [place] [address], [place], [place] [address]   Protime-INR     Status: Abnormal   Collection Time: 10/08/21  8:47 PM  Result Value Ref Range   Prothrombin Time 21.9 (H) 11.4 - 15.2 seconds   INR 1.9 (H) 0.8 - 1.2    Comment: (NOTE) INR goal varies based on device and disease states. Performed at [facility], [place] [address] [place], [place] [address]   MRSA Next Gen by PCR, Nasal     Status: None   Collection Time: 10/08/21 10:36 PM   Specimen: Nasal Mucosa; Nasal Swab  Result Value Ref Range   MRSA by PCR Next Gen NOT DETECTED NOT DETECTED    Comment: (NOTE) The GeneXpert MRSA Assay (FDA approved for NASAL specimens only), is one component of a comprehensive MRSA colonization surveillance program. It is not intended to diagnose MRSA infection nor to guide or monitor treatment for MRSA infections. Test performance is not FDA approved in patients less than 71 years old. Performed at [facility], [place] [address] [place], [place] [address]   Heparin level (unfractionated)     Status: Abnormal   Collection Time: 10/09/21  6:30 AM  Result Value Ref Range   Heparin Unfractionated >1.10 (H) 0.30 - 0.70 IU/mL    Comment: (NOTE) The clinical reportable range upper limit is being lowered to >1.10 to align with the FDA approved guidance for the current laboratory assay.  If heparin results are below expected values, and patient dosage has  been confirmed, suggest follow up testing of antithrombin III levels. Performed at [facility], [place] [address] [place], [place] [address]   Basic metabolic panel     Status: Abnormal   Collection Time: 10/09/21  6:30 AM  Result Value Ref Range   Sodium 133 (L) 135 - 145 mmol/L   Potassium 4.3 3.5 - 5.1 mmol/L   Chloride 101 98 - 111 mmol/L   CO2 20 (L) 22 - 32 mmol/L   Glucose, Bld 110 (H)  70 - 99 mg/dL    Comment: Glucose reference range applies only to samples taken after fasting for at least 8 hours.   BUN 11 8 - 23 mg/dL   Creatinine, Ser 0.76 0.44 - 1.00 mg/dL   Calcium 8.5 (L) 8.9 - 10.3 mg/dL   GFR, Estimated >60 >60 mL/min    Comment: (NOTE) Calculated using the CKD-EPI Creatinine Equation (2021)    Anion gap 12 5 - 15    Comment: Performed at [facility] [address] [place], [place] [address]  CBC     Status: Abnormal   Collection Time: 10/09/21  6:30 AM  Result Value Ref Range   WBC 8.4 4.0 - 10.5 K/uL   RBC 3.49 (L) 3.87 - 5.11 MIL/uL   Hemoglobin 10.5 (L) 12.0 - 15.0 g/dL   HCT 31.9 (L) 36.0 - 46.0 %   MCV 91.4 80.0 - 100.0 fL   MCH 30.1 26.0 - 34.0 pg   MCHC 32.9 30.0 - 36.0 g/dL   RDW 17.4 (H) 11.5 - 15.5 %   Platelets 416 (H) 150 - 400 K/uL   nRBC 0.0 0.0 - 0.2 %    Comment: Performed at [facility] [address], [place], [place] [address]  APTT     Status: Abnormal   Collection Time: 10/09/21  6:30 AM  Result Value Ref Range   aPTT 125 (H) 24 - 36 seconds    Comment:        IF BASELINE aPTT IS ELEVATED, SUGGEST PATIENT RISK ASSESSMENT BE USED TO DETERMINE APPROPRIATE ANTICOAGULANT THERAPY. Performed at [facility], [place] [address], [place], [place] [address]     ECG   Afib with RVR, bifascicular block - Personally Reviewed  Telemetry   Afib with RVR - Personally Reviewed  Radiology   CT CHEST ABDOMEN PELVIS W CONTRAST  Result Date: 10/08/2021 CLINICAL DATA:  Blunt polytrauma EXAM: CT CHEST, ABDOMEN, AND PELVIS WITH CONTRAST TECHNIQUE: Multidetector CT imaging of the chest, abdomen and pelvis was performed following the standard protocol during bolus administration of intravenous contrast. RADIATION DOSE REDUCTION: This exam was performed according to the departmental dose-optimization program which includes automated exposure control, adjustment of the mA and/or kV according to patient size and/or use of  iterative reconstruction technique. CONTRAST:  39m OMNIPAQUE IOHEXOL 300 MG/ML SOLN IV. No oral contrast. COMPARISON:  CT angio chest 09/20/2021, CT abdomen and pelvis 10/04/2006 FINDINGS: CT CHEST FINDINGS Cardiovascular: Aorta normal caliber. Scattered atherosclerotic calcifications aorta in coronary arteries. Enlargement of cardiac chambers greatest at RIGHT atrium. No pericardial effusion. Pulmonary arteries adequately opacified. Large filling defect again identified within RIGHT pulmonary artery consistent with pulmonary embolism. Occlusive thrombus extends into RIGHT middle and RIGHT lower lobe pulmonary arteries. Smaller pulmonary emboli are seen in BILATERAL upper and LEFT lower lobe pulmonary arteries. These were all evident on the prior study as well. Mediastinum/Nodes: Esophagus unremarkable. Base of cervical region normal appearance. No thoracic adenopathy. Lungs/Pleura: Moderate RIGHT pleural effusion. Subsegmental atelectasis RIGHT lower and RIGHT middle lobes. Subpleural interstitial changes LEFT lung again seen. No new infiltrate or pneumothorax. Musculoskeletal: Diffuse osseous demineralization. LEFT breast prosthesis. CT ABDOMEN PELVIS FINDINGS Hepatobiliary: Gallbladder and liver normal appearance Pancreas: Few scattered parenchymal calcifications. Atrophic. No definite mass. Spleen: Normal appearance Adrenals/Urinary Tract: Nonobstructing LEFT renal calculus. Adrenal glands, kidneys, ureters, and bladder otherwise normal appearance. Stomach/Bowel: Appendix not visualized. Scattered stool throughout colon. Uncomplicated sigmoid diverticula. Stomach and bowel loops otherwise unremarkable. Vascular/Lymphatic: Atherosclerotic calcifications aorta and iliac arteries without aneurysm. No adenopathy. Reproductive: Uterus surgically absent with nonvisualization of ovaries Other: No free air or free fluid.  No hernia. Musculoskeletal: Osseous demineralization with postsurgical changes of posterior L5-S1  fusion. IMPRESSION: BILATERAL pulmonary emboli as above with much larger clot burden on RIGHT, unchanged since 09/20/2021, with persistent enlargement of cardiac chambers particularly RIGHT atrium. Moderate RIGHT pleural effusion with subsegmental atelectasis at RIGHT lung base. No acute intra-abdominal or intrapelvic abnormalities. Nonobstructing LEFT renal calculus. Uncomplicated sigmoid diverticulosis. Aortic Atherosclerosis (ICD10-I70.0). Findings discussed with Dr. PAlvino Chapelon 10/08/2021  at 1249 hours. Electronically Signed   By: Lavonia Dana M.D.   On: 10/08/2021 12:49   CT HEAD WO CONTRAST (5MM)  Result Date: 10/08/2021 CLINICAL DATA:  Head trauma, minor (Age >= 65y); Facial trauma, blunt; Neck trauma (Age >= 65y) EXAM: CT HEAD WITHOUT CONTRAST CT MAXILLOFACIAL WITHOUT CONTRAST CT CERVICAL SPINE WITHOUT CONTRAST TECHNIQUE: Multidetector CT imaging of the head, cervical spine, and maxillofacial structures were performed using the standard protocol without intravenous contrast. Multiplanar CT image reconstructions of the cervical spine and maxillofacial structures were also generated. RADIATION DOSE REDUCTION: This exam was performed according to the departmental dose-optimization program which includes automated exposure control, adjustment of the mA and/or kV according to patient size and/or use of iterative reconstruction technique. COMPARISON:  None Available. FINDINGS: CT HEAD FINDINGS Brain: No evidence of acute infarction, hemorrhage, hydrocephalus, extra-axial collection or mass lesion/mass effect. Patchy white matter hypoattenuation, nonspecific but compatible with chronic microvascular ischemic disease. Cerebral atrophy. Vascular: No hyperdense vessel identified. Calcific intracranial atherosclerosis. Skull: No acute fracture. Other: No mastoid effusions. CT MAXILLOFACIAL FINDINGS Osseous: No fracture or mandibular dislocation. No destructive process. Orbits: Negative. No traumatic or inflammatory  finding. Sinuses: Clear sinuses. Soft tissues: Negative. CT CERVICAL SPINE FINDINGS Alignment: Unchanged alignment with similar anterolisthesis of C4 on C5 and C7 on T1. No new sagittal subluxation. Skull base and vertebrae: Vertebral body heights are maintained. No evidence of acute fracture. Soft tissues and spinal canal: No prevertebral fluid or swelling. No visible canal hematoma. Disc levels: Similar multilevel degenerative disc disease, moderate to severe. Similar multilevel facet and uncovertebral hypertrophy with varying degrees of neural foraminal stenosis. Upper chest: Partially imaged right pleural effusion. IMPRESSION: 1. No evidence of acute intracranial abnormality or facial fracture. 2. No evidence of acute fracture or traumatic malalignment in the cervical spine. 3. Partially imaged right pleural effusion. Electronically Signed   By: Margaretha Sheffield M.D.   On: 10/08/2021 09:07   CT Maxillofacial Wo Contrast  Result Date: 10/08/2021 CLINICAL DATA:  Head trauma, minor (Age >= 65y); Facial trauma, blunt; Neck trauma (Age >= 65y) EXAM: CT HEAD WITHOUT CONTRAST CT MAXILLOFACIAL WITHOUT CONTRAST CT CERVICAL SPINE WITHOUT CONTRAST TECHNIQUE: Multidetector CT imaging of the head, cervical spine, and maxillofacial structures were performed using the standard protocol without intravenous contrast. Multiplanar CT image reconstructions of the cervical spine and maxillofacial structures were also generated. RADIATION DOSE REDUCTION: This exam was performed according to the departmental dose-optimization program which includes automated exposure control, adjustment of the mA and/or kV according to patient size and/or use of iterative reconstruction technique. COMPARISON:  None Available. FINDINGS: CT HEAD FINDINGS Brain: No evidence of acute infarction, hemorrhage, hydrocephalus, extra-axial collection or mass lesion/mass effect. Patchy white matter hypoattenuation, nonspecific but compatible with chronic  microvascular ischemic disease. Cerebral atrophy. Vascular: No hyperdense vessel identified. Calcific intracranial atherosclerosis. Skull: No acute fracture. Other: No mastoid effusions. CT MAXILLOFACIAL FINDINGS Osseous: No fracture or mandibular dislocation. No destructive process. Orbits: Negative. No traumatic or inflammatory finding. Sinuses: Clear sinuses. Soft tissues: Negative. CT CERVICAL SPINE FINDINGS Alignment: Unchanged alignment with similar anterolisthesis of C4 on C5 and C7 on T1. No new sagittal subluxation. Skull base and vertebrae: Vertebral body heights are maintained. No evidence of acute fracture. Soft tissues and spinal canal: No prevertebral fluid or swelling. No visible canal hematoma. Disc levels: Similar multilevel degenerative disc disease, moderate to severe. Similar multilevel facet and uncovertebral hypertrophy with varying degrees of neural foraminal stenosis. Upper chest: Partially imaged right pleural effusion. IMPRESSION: 1.  No evidence of acute intracranial abnormality or facial fracture. 2. No evidence of acute fracture or traumatic malalignment in the cervical spine. 3. Partially imaged right pleural effusion. Electronically Signed   By: Margaretha Sheffield M.D.   On: 10/08/2021 09:07   CT Cervical Spine Wo Contrast  Result Date: 10/08/2021 CLINICAL DATA:  Head trauma, minor (Age >= 65y); Facial trauma, blunt; Neck trauma (Age >= 65y) EXAM: CT HEAD WITHOUT CONTRAST CT MAXILLOFACIAL WITHOUT CONTRAST CT CERVICAL SPINE WITHOUT CONTRAST TECHNIQUE: Multidetector CT imaging of the head, cervical spine, and maxillofacial structures were performed using the standard protocol without intravenous contrast. Multiplanar CT image reconstructions of the cervical spine and maxillofacial structures were also generated. RADIATION DOSE REDUCTION: This exam was performed according to the departmental dose-optimization program which includes automated exposure control, adjustment of the mA and/or  kV according to patient size and/or use of iterative reconstruction technique. COMPARISON:  None Available. FINDINGS: CT HEAD FINDINGS Brain: No evidence of acute infarction, hemorrhage, hydrocephalus, extra-axial collection or mass lesion/mass effect. Patchy white matter hypoattenuation, nonspecific but compatible with chronic microvascular ischemic disease. Cerebral atrophy. Vascular: No hyperdense vessel identified. Calcific intracranial atherosclerosis. Skull: No acute fracture. Other: No mastoid effusions. CT MAXILLOFACIAL FINDINGS Osseous: No fracture or mandibular dislocation. No destructive process. Orbits: Negative. No traumatic or inflammatory finding. Sinuses: Clear sinuses. Soft tissues: Negative. CT CERVICAL SPINE FINDINGS Alignment: Unchanged alignment with similar anterolisthesis of C4 on C5 and C7 on T1. No new sagittal subluxation. Skull base and vertebrae: Vertebral body heights are maintained. No evidence of acute fracture. Soft tissues and spinal canal: No prevertebral fluid or swelling. No visible canal hematoma. Disc levels: Similar multilevel degenerative disc disease, moderate to severe. Similar multilevel facet and uncovertebral hypertrophy with varying degrees of neural foraminal stenosis. Upper chest: Partially imaged right pleural effusion. IMPRESSION: 1. No evidence of acute intracranial abnormality or facial fracture. 2. No evidence of acute fracture or traumatic malalignment in the cervical spine. 3. Partially imaged right pleural effusion. Electronically Signed   By: Margaretha Sheffield M.D.   On: 10/08/2021 09:07   DG Pelvis Portable  Result Date: 10/08/2021 CLINICAL DATA:  Fall EXAM: PORTABLE PELVIS 1-2 VIEWS COMPARISON:  Portable exam 0833 hours compared to 08/13/2021 FINDINGS: Osseous demineralization. BILATERAL hip joint narrowing and spur formation. SI joints preserved. Prior L5-S1 fusion. No acute fracture, dislocation, or bone destruction. Atherosclerotic calcifications at  the femoral arteries bilaterally. Significant lateral soft tissue swelling at the level of the LEFT hip and proximal LEFT femur. IMPRESSION: No acute osseous abnormalities. Degenerative changes of BILATERAL hip joints. Electronically Signed   By: Lavonia Dana M.D.   On: 10/08/2021 08:50   DG Chest Portable 1 View  Result Date: 10/08/2021 CLINICAL DATA:  Fall EXAM: PORTABLE CHEST 1 VIEW COMPARISON:  Portable exam 0832 hours compared to 09/20/2021 FINDINGS: Enlargement of cardiac silhouette. Atherosclerotic calcification aorta. Mediastinal contours and pulmonary vascularity otherwise normal. Small RIGHT pleural effusion with RIGHT basilar atelectasis, minimally increased from previous exam. No acute infiltrate or pneumothorax. Diffuse osseous demineralization with question chronic LEFT rotator cuff tear. Degenerative changes at RIGHT sternoclavicular joint. Degenerative disc disease changes and biconvex scoliosis thoracolumbar spine. No definite acute fractures. IMPRESSION: Slightly increased RIGHT pleural effusion and basilar atelectasis versus prior exam. Enlargement of cardiac silhouette. Aortic Atherosclerosis (ICD10-I70.0). Electronically Signed   By: Lavonia Dana M.D.   On: 10/08/2021 08:48    Cardiac Studies   N/A  Impression   Principal Problem:   A-fib [facility]) Active Problems:  Paroxysmal atrial fibrillation with RVR (HCC)   Recommendation   AFib with RVR - diltiazem started with some improvement in rate. Continue this with metoprolol. If hemoglobin stable without s/s of bleeding, transition back to Eliquis for DVT/PE and afib. No s/s of decompensated heart failure. Will follow with you for rate control. No need for repeat echo given recent study - I cancelled this.  Thanks for the consultation.  Time Spent Directly with Patient:  I have spent a total of 45 minutes with the patient reviewing hospital notes, telemetry, EKGs, labs and examining the patient as well as establishing an  assessment and plan that was discussed personally with the patient.  > 50% of time was spent in direct patient care.  Length of Stay:  LOS: 1 day   Pixie Casino, MD, [facility], [place] Director of the Advanced Lipid Disorders &  Cardiovascular Risk Reduction Clinic Diplomate of the American Board of Clinical Lipidology Attending Cardiologist  Direct Dial: [phone]  Fax: [phone]  Website:  [email or web address] 10/09/2021, 9:58 AM

## 2021-10-09 NOTE — Evaluation (Signed)
Occupational Therapy Evaluation Patient Details Name: Judy [name] MRN: [id number] DOB: July 31, 1930 Today's Date: 10/09/2021   History of Present Illness 88 y.o. female admitted 8/25 from [place] with fall and AFib. PMhx: PE, DVT on Eliquis, PAF, HTN, chronic HFrEF, back sx, Lt RCR, dementia, breast CA   Clinical Impression   Patient admitted for the diagnosis above.  PTA she lives at a local ALF.  Per her nephew: she walked without her RW in her apartment, needed supervision only for showers, staff assisted with medications due to memory deficits, but was able to walk to the common dining room with her RW.  Nephew stated she struggled with immediate recall and remote memory, but remembers family and friends, and converses well with staff and fellow residents.  Currently she is very lethargic, not following commands well, needs Mod A for basic transfers, and is Max A for lower body ADL.  OT will follow in the acute setting, and SNF is recommended, depending on progress.  The nephew states the ALF can provide more assist if appropriate.          Recommendations for follow up therapy are one component of a multi-disciplinary discharge planning process, led by the attending physician.  Recommendations may be updated based on patient status, additional functional criteria and insurance authorization.   Follow Up Recommendations  Skilled nursing-short term rehab (<3 hours/day)    Assistance Recommended at Discharge Frequent or constant Supervision/Assistance  Patient can return home with the following A lot of help with walking and/or transfers;Assistance with feeding;Help with stairs or ramp for entrance;Assist for transportation;A lot of help with bathing/dressing/bathroom    Functional Status Assessment  Patient has had a recent decline in their functional status and demonstrates the ability to make significant improvements in function in a reasonable and predictable amount of time.   Equipment Recommendations  None recommended by OT    Recommendations for Other Services       Precautions / Restrictions Precautions Precautions: Fall Precaution Comments: restraints Restrictions Weight Bearing Restrictions: No      Mobility Bed Mobility               General bed mobility comments: up in the recliner    Transfers Overall transfer level: Needs assistance Equipment used: Rolling walker (2 wheels) Transfers: Sit to/from Stand Sit to Stand: Mod assist, Max assist                  Balance Overall balance assessment: Needs assistance Sitting-balance support: Feet supported Sitting balance-Leahy Scale: Poor   Postural control: Right lateral lean Standing balance support: Bilateral upper extremity supported Standing balance-Leahy Scale: Poor                             ADL either performed or assessed with clinical judgement   ADL Overall ADL's : Needs assistance/impaired Eating/Feeding: Moderate assistance;Sitting   Grooming: Wash/dry face;Wash/dry hands;Sitting;Moderate assistance   Upper Body Bathing: Moderate assistance;Sitting   Lower Body Bathing: Maximal assistance;Bed level   Upper Body Dressing : Moderate assistance;Sitting   Lower Body Dressing: Maximal assistance;Bed level   Toilet Transfer: Moderate assistance;BSC/3in1;Stand-pivot   Toileting- Clothing Manipulation and Hygiene: Maximal assistance;Bed level               Vision   Vision Assessment?: No apparent visual deficits     Perception Perception Perception: Not tested   Praxis Praxis Praxis: Not tested  Pertinent Vitals/Pain Pain Assessment Pain Assessment: No/denies pain     Hand Dominance Right   Extremity/Trunk Assessment Upper Extremity Assessment Upper Extremity Assessment: Overall WFL for tasks assessed   Lower Extremity Assessment Lower Extremity Assessment: Defer to PT evaluation   Cervical / Trunk Assessment Cervical  / Trunk Assessment: Kyphotic   Communication Communication Communication: HOH   Cognition Arousal/Alertness: Awake/alert Behavior During Therapy: Restless, Agitated Overall Cognitive Status: History of cognitive impairments - at baseline Area of Impairment: Orientation, Attention, Memory, Following commands, Safety/judgement, Awareness                 Orientation Level: Disoriented to, Place, Time, Situation Current Attention Level: Focused Memory: Decreased short-term memory Following Commands: Follows one step commands inconsistently Safety/Judgement: Decreased awareness of safety, Decreased awareness of deficits Awareness: Intellectual   General Comments: At baseline, per family, patient with memory deficts, but recognizes family and converses well with facilty staff and other residents.     General Comments   VSS    Exercises     Shoulder Instructions      Home Living Family/patient expects to be discharged to:: Assisted living   Available Help at Discharge: Family;Available PRN/intermittently Type of Home: Assisted living                       Home Equipment: Conservation officer, nature (2 wheels);Hand held shower head;Grab bars - tub/shower;Grab bars - toilet;Shower seat          Prior Functioning/Environment Prior Level of Function : History of Falls (last six months);Needs assist             Mobility Comments: Per family: patient generally did not use RW in her apartment.  Able to walk to the dinning area with RW. ADLs Comments: Patient showered with supervision from staff, and needed assist with her medications.  Patient would make her bed at baseline.        OT Problem List: Impaired balance (sitting and/or standing);Decreased safety awareness;Decreased cognition;Decreased strength      OT Treatment/Interventions: Self-care/ADL training;Therapeutic exercise;Therapeutic activities;Cognitive remediation/compensation;Patient/family education;Balance  training;DME and/or AE instruction;Energy conservation    OT Goals(Current goals can be found in the care plan section) Acute Rehab OT Goals OT Goal Formulation: Patient unable to participate in goal setting Time For Goal Achievement: 10/22/21 Potential to Achieve Goals: Good ADL Goals Pt Will Perform Grooming: with supervision;sitting Pt Will Perform Upper Body Dressing: with supervision;sitting Pt Will Perform Lower Body Dressing: with min assist;sit to/from stand Pt Will Transfer to Toilet: with min guard assist;ambulating;regular height toilet Pt Will Perform Toileting - Clothing Manipulation and hygiene: with supervision;sit to/from stand;sitting/lateral leans  OT Frequency: Min 2X/week    Co-evaluation              AM-PAC OT "6 Clicks" Daily Activity     Outcome Measure Help from another person eating meals?: A Lot Help from another person taking care of personal grooming?: A Lot Help from another person toileting, which includes using toliet, bedpan, or urinal?: A Lot Help from another person bathing (including washing, rinsing, drying)?: A Lot Help from another person to put on and taking off regular upper body clothing?: A Lot Help from another person to put on and taking off regular lower body clothing?: A Lot 6 Click Score: 12   End of Session Equipment Utilized During Treatment: Gait belt;Rolling walker (2 wheels) Nurse Communication: Mobility status  Activity Tolerance: Patient limited by lethargy;Treatment limited secondary to agitation Patient  left: in chair;with call bell/phone within reach;with chair alarm set;with family/visitor present;with restraints reapplied  OT Visit Diagnosis: Unsteadiness on feet (R26.81);Other abnormalities of gait and mobility (R26.89);Muscle weakness (generalized) (M62.81);History of falling (Z91.81)                Time: 6754-4920 OT Time Calculation (min): 16 min Charges:  OT General Charges $OT Visit: 1 Visit OT Evaluation $OT  Eval Moderate Complexity: 1 Mod  10/09/2021  RP, OTR/L  Acute Rehabilitation Services  Office:  [phone]   Metta Clines 10/09/2021, 12:50 PM

## 2021-10-09 NOTE — Progress Notes (Signed)
ANTICOAGULATION CONSULT NOTE   Pharmacy Consult for Heparin Indication: atrial fibrillation & PE/DVT  Allergies  Allergen Reactions   Amoxil [Amoxicillin] Nausea Only   Codeine Nausea Only   Aricept [Donepezil Hcl] Other (See Comments)    "Felt drunk" Headaches Drowsiness   Catapres [Clonidine] Other (See Comments)    Dry mouth   Hycodan [Hydrocodone Bit-Homatrop Mbr] Diarrhea and Nausea And Vomiting   Norco [Hydrocodone-Acetaminophen] Diarrhea and Nausea And Vomiting   Norvasc [Amlodipine] Swelling   Prolia [Denosumab] Other (See Comments)    Unknown reaction   Ultram [Tramadol] Nausea Only   Zoloft [Sertraline] Nausea Only   Clavulanic Acid Hives and Rash    Patient Measurements: Height: _0  (162.6 cm) Weight: 54.7 kg (120 lb 9.5 oz) IBW/kg (Calculated) : 54.7 Heparin Dosing Weight: 55.6 kg  Vital Signs: Temp: 97.6 F (36.4 C) (08/26 2118) Temp Source: Axillary (08/26 2118) BP: 125/79 (08/26 2118) Pulse Rate: 87 (08/26 2118)  Labs: Recent Labs    10/08/21 0839 10/08/21 2047 10/09/21 0630 10/09/21 2149  HGB 10.5*  --  10.5*  --   HCT 32.2*  --  31.9*  --   PLT 434*  --  416*  --   APTT  --  72* 125* 65*  LABPROT  --  21.9*  --   --   INR  --  1.9*  --   --   HEPARINUNFRC  --  >1.10* >1.10*  --   CREATININE 0.69  --  0.76  --      Estimated Creatinine Clearance: 41.2 mL/min (by C-G formula based on SCr of 0.76 mg/dL).   Medical History: Past Medical History:  Diagnosis Date   Blood transfusion    Breast cancer, left ([place]) 09/26/2011   Punch biopsies of the left nipple done on 09/16/2011.  ER and PR were negative.  HER2 is positive with ratio 4.68.Had mastectomy on 10/25/11. Path T1,N0.    Chronic low back pain    Dyslipidemia    GERD (gastroesophageal reflux disease)    HTN (hypertension)    Osteoarthritis    Osteoporosis       Scheduled:   acetaminophen  1,000 mg Oral BID   acidophilus  1 capsule Oral Daily   atorvastatin  10 mg Oral QHS    [START ON 10/10/2021] feeding supplement  237 mL Oral BID BM   metoprolol tartrate  100 mg Oral BID   QUEtiapine  25 mg Oral QHS   senna-docusate  1 tablet Oral BID   sodium chloride flush  3 mL Intravenous Q12H   Infusions:   sodium chloride     diltiazem (CARDIZEM) infusion 10 mg/hr (10/09/21 2135)   heparin 700 Units/hr (10/09/21 1700)   PRN: sodium chloride, acetaminophen, enalaprilat, haloperidol lactate, HYDROmorphone (DILAUDID) injection, metoprolol tartrate, ondansetron (ZOFRAN) IV, sodium chloride flush  Assessment: 49 yof with a history of diagnosed acute bilateral segmental PE and acute right popliteal DVT on Eliquis, PAF, HTN, chronic HFrEF. Patient is presenting from NH for a fall. Heparin per pharmacy consult placed for  atrial fibrillation & PE/DVT .  Patient was on apixaban prior to arrival. Last dose 8/24 pm. Will require aPTT monitoring due to likely falsely high anti-Xa level secondary to DOAC use.  CT Head w/o hemorrhage  Repeat aPTT is just below goal at 65 seconds.  Goal of Therapy:  Heparin level 0.3-0.7 units/ml aPTT 66-102 seconds Monitor platelets by anticoagulation protocol: Yes   Plan:  Increase heparin to 750 units/h Recheck aPTT,  heparin level with am labs  Arrie Senate, PharmD, [place], [facility] Clinical Pharmacist [phone] Please check AMION for all [place] numbers 10/09/2021

## 2021-10-09 NOTE — Evaluation (Signed)
Physical Therapy Evaluation Patient Details Name: Judy [name] MRN: [id number] DOB: 16-Mar-1930 Today's Date: 10/09/2021  History of Present Illness  88 y.o. female admitted 8/25 from [place] with fall and AFib. PMhx: PE, DVT on Eliquis, PAF, HTN, chronic HFrEF, back sx, Lt RCR, dementia, breast CA  Clinical Impression  Pt pleasantly confused, restless and constantly reaching for and grabbing items in environment. Pt at baseline walks with RW with frequent falls per chart and today was mod assist for transfers and very limited gait as pt highly distracted and unable to follow commands consistently for mobility. Pt with decreased gait, transfers, function and balance who will benefit from trial of acute therapy to maximize mobility, safety and function prior to D/C.  HR 85     Recommendations for follow up therapy are one component of a multi-disciplinary discharge planning process, led by the attending physician.  Recommendations may be updated based on patient status, additional functional criteria and insurance authorization.  Follow Up Recommendations Skilled nursing-short term rehab (<3 hours/day) (SNF unless ALF memory care able to continue to provide physical assist for mobility) Can patient physically be transported by private vehicle: No    Assistance Recommended at Discharge Frequent or constant Supervision/Assistance  Patient can return home with the following  A lot of help with walking and/or transfers;A lot of help with bathing/dressing/bathroom;Assistance with feeding;Assist for transportation;Assistance with cooking/housework;Direct supervision/assist for financial management;Direct supervision/assist for medications management    Equipment Recommendations None recommended by PT  Recommendations for Other Services       Functional Status Assessment Patient has had a recent decline in their functional status and/or demonstrates limited ability to make significant  improvements in function in a reasonable and predictable amount of time     Precautions / Restrictions Precautions Precautions: Fall      Mobility  Bed Mobility Overal bed mobility: Needs Assistance Bed Mobility: Supine to Sit     Supine to sit: Mod assist     General bed mobility comments: mod assist with increased time to get pt to move in correct direction for getting OOB, physical assist to bring legs to EOB and lift trunk. min assist to scoot to EOB once sitting    Transfers Overall transfer level: Needs assistance   Transfers: Sit to/from Stand Sit to Stand: Mod assist           General transfer comment: mod assist to rise from bed x 2, pt difficult to direct and sitting back down after initial stand    Ambulation/Gait Ambulation/Gait assistance: Min assist Gait Distance (Feet): 4 Feet Assistive device: Rolling walker (2 wheels) Gait Pattern/deviations: Step-through pattern, Decreased stride length   Gait velocity interpretation: <1.31 ft/sec, indicative of household ambulator   General Gait Details: pt very restless with frequent reaching out for other items rather than holding RW, assist to direct and control RW as well as for balance  Stairs            Wheelchair Mobility    Modified Rankin (Stroke Patients Only)       Balance Overall balance assessment: Needs assistance Sitting-balance support: Feet supported, Bilateral upper extremity supported Sitting balance-Leahy Scale: Poor Sitting balance - Comments: right anterior lean in sitting with min assist for sitting balance with periods of minguard   Standing balance support: Bilateral upper extremity supported, During functional activity, Reliant on assistive device for balance Standing balance-Leahy Scale: Poor Standing balance comment: reliant on UE support and physical assist for balance  Pertinent Vitals/Pain Pain Assessment Pain Assessment:  No/denies pain    Home Living Family/patient expects to be discharged to:: Assisted living     Type of Home: Assisted living           Home Equipment: Conservation officer, nature (2 wheels)      Prior Function Prior Level of Function : History of Falls (last six months);Needs assist             Mobility Comments: per chart and prior admission pt walking with RW, no family present to confirm ADLs Comments: assist for bathing and dressing, unsure of toileting as pt could not provide     Hand Dominance        Extremity/Trunk Assessment   Upper Extremity Assessment Upper Extremity Assessment: Generalized weakness    Lower Extremity Assessment Lower Extremity Assessment: Generalized weakness    Cervical / Trunk Assessment Cervical / Trunk Assessment: Kyphotic  Communication      Cognition Arousal/Alertness: Awake/alert Behavior During Therapy: Restless Overall Cognitive Status: No family/caregiver present to determine baseline cognitive functioning                                 General Comments: pt stating time as July 1984, oriented to self and her height. Looking for pie and other items in bed. Follows single step commands very intermittently        General Comments      Exercises     Assessment/Plan    PT Assessment Patient needs continued PT services  PT Problem List Decreased strength;Decreased mobility;Decreased safety awareness;Decreased activity tolerance;Decreased cognition;Decreased balance       PT Treatment Interventions DME instruction;Therapeutic activities;Gait training;Therapeutic exercise;Patient/family education;Balance training;Functional mobility training;Neuromuscular re-education    PT Goals (Current goals can be found in the Care Plan section)  Acute Rehab PT Goals Patient Stated Goal: "find my pie" PT Goal Formulation: With patient Time For Goal Achievement: 10/23/21 Potential to Achieve Goals: Fair    Frequency Min  2X/week     Co-evaluation               AM-PAC PT "6 Clicks" Mobility  Outcome Measure Help needed turning from your back to your side while in a flat bed without using bedrails?: A Lot Help needed moving from lying on your back to sitting on the side of a flat bed without using bedrails?: A Lot Help needed moving to and from a bed to a chair (including a wheelchair)?: A Lot Help needed standing up from a chair using your arms (e.g., wheelchair or bedside chair)?: A Lot Help needed to walk in hospital room?: A Lot Help needed climbing 3-5 steps with a railing? : Total 6 Click Score: 11    End of Session Equipment Utilized During Treatment: Gait belt Activity Tolerance: Patient tolerated treatment well Patient left: in chair;with call bell/phone within reach;with chair alarm set Nurse Communication: Mobility status PT Visit Diagnosis: Other abnormalities of gait and mobility (R26.89);Muscle weakness (generalized) (M62.81)    Time: 1749-4496 PT Time Calculation (min) (ACUTE ONLY): 28 min   Charges:   PT Evaluation $PT Eval Moderate Complexity: 1 Mod PT Treatments $Therapeutic Activity: 8-22 mins        Bayard Males, PT Acute Rehabilitation Services Office: [phone]   Sandy Salaam Raymar Joiner 10/09/2021, 10:55 AM

## 2021-10-09 NOTE — Progress Notes (Signed)
ANTICOAGULATION CONSULT NOTE   Pharmacy Consult for Heparin Indication: atrial fibrillation & PE/DVT  Allergies  Allergen Reactions   Amoxil [Amoxicillin] Nausea Only   Codeine Nausea Only   Aricept [Donepezil Hcl] Other (See Comments)    "Felt drunk" Headaches Drowsiness   Catapres [Clonidine] Other (See Comments)    Dry mouth   Hycodan [Hydrocodone Bit-Homatrop Mbr] Diarrhea and Nausea And Vomiting   Norco [Hydrocodone-Acetaminophen] Diarrhea and Nausea And Vomiting   Norvasc [Amlodipine] Swelling   Prolia [Denosumab] Other (See Comments)    Unknown reaction   Ultram [Tramadol] Nausea Only   Zoloft [Sertraline] Nausea Only   Clavulanic Acid Hives and Rash    Patient Measurements: Height: '5\' 4"'  (162.6 cm) Weight: 54.7 kg (120 lb 9.5 oz) IBW/kg (Calculated) : 54.7 Heparin Dosing Weight: 55.6 kg  Vital Signs: Temp: 98 F (36.7 C) (08/26 0734) Temp Source: Oral (08/26 0734) BP: 118/90 (08/26 0734) Pulse Rate: 88 (08/26 1048)  Labs: Recent Labs    10/08/21 0839 10/08/21 2047 10/09/21 0630  HGB 10.5*  --  10.5*  HCT 32.2*  --  31.9*  PLT 434*  --  416*  APTT  --  72* 125*  LABPROT  --  21.9*  --   INR  --  1.9*  --   HEPARINUNFRC  --  >1.10* >1.10*  CREATININE 0.69  --  0.76     Estimated Creatinine Clearance: 41.2 mL/min (by C-G formula based on SCr of 0.76 mg/dL).   Medical History: Past Medical History:  Diagnosis Date   Blood transfusion    Breast cancer, left ([place]) 09/26/2011   Punch biopsies of the left nipple done on 09/16/2011.  ER and PR were negative.  HER2 is positive with ratio 4.68.Had mastectomy on 10/25/11. Path T1,N0.    Chronic low back pain    Dyslipidemia    GERD (gastroesophageal reflux disease)    HTN (hypertension)    Osteoarthritis    Osteoporosis       Scheduled:   acetaminophen  1,000 mg Oral BID   acidophilus  1 capsule Oral Daily   atorvastatin  10 mg Oral QHS   metoprolol tartrate  100 mg Oral BID   QUEtiapine  25  mg Oral QHS   sodium chloride flush  3 mL Intravenous Q12H   Infusions:   sodium chloride     diltiazem (CARDIZEM) infusion 10 mg/hr (10/09/21 1019)   heparin 1,000 Units/hr (10/08/21 1851)   PRN: sodium chloride, acetaminophen, enalaprilat, haloperidol lactate, HYDROmorphone (DILAUDID) injection, metoprolol tartrate, ondansetron (ZOFRAN) IV, sodium chloride flush  Assessment: 60 yof with a history of diagnosed acute bilateral segmental PE and acute right popliteal DVT on Eliquis, PAF, HTN, chronic HFrEF. Patient is presenting from NH for a fall. Heparin per pharmacy consult placed for  atrial fibrillation & PE/DVT .  Patient was on apixaban prior to arrival. Last dose 8/24 pm. Will require aPTT monitoring due to likely falsely high anti-Xa level secondary to DOAC use.  CT Head w/o hemorrhage  Heparin level >1 as expected with recent apixaban use. Aptt also high at 125s. No bleeding issues noted, cbc stable.   Goal of Therapy:  Heparin level 0.3-0.7 units/ml aPTT 66-102 seconds Monitor platelets by anticoagulation protocol: Yes   Plan:  Hold heparin for 1 hour  Restart heparin infusion at 700 units/hr Check aPTT in 8 hours and daily while on heparin Continue to monitor via aPTT until levels are correlated Continue to monitor H&H and platelets  Erin Hearing  PharmD., BCPS Clinical Pharmacist 10/09/2021 10:57 AM

## 2021-10-10 DIAGNOSIS — F0390 Unspecified dementia without behavioral disturbance: Secondary | ICD-10-CM

## 2021-10-10 DIAGNOSIS — R627 Adult failure to thrive: Secondary | ICD-10-CM | POA: Diagnosis not present

## 2021-10-10 DIAGNOSIS — J90 Pleural effusion, not elsewhere classified: Secondary | ICD-10-CM | POA: Diagnosis not present

## 2021-10-10 DIAGNOSIS — I480 Paroxysmal atrial fibrillation: Secondary | ICD-10-CM | POA: Diagnosis not present

## 2021-10-10 LAB — CBC WITH DIFFERENTIAL/PLATELET
Abs Immature Granulocytes: 0.03 10*3/uL (ref 0.00–0.07)
Basophils Absolute: 0 10*3/uL (ref 0.0–0.1)
Basophils Relative: 0 %
Eosinophils Absolute: 0 10*3/uL (ref 0.0–0.5)
Eosinophils Relative: 0 %
HCT: 28.5 % — ABNORMAL LOW (ref 36.0–46.0)
Hemoglobin: 9.8 g/dL — ABNORMAL LOW (ref 12.0–15.0)
Immature Granulocytes: 0 %
Lymphocytes Relative: 12 %
Lymphs Abs: 1 10*3/uL (ref 0.7–4.0)
MCH: 30.8 pg (ref 26.0–34.0)
MCHC: 34.4 g/dL (ref 30.0–36.0)
MCV: 89.6 fL (ref 80.0–100.0)
Monocytes Absolute: 0.8 10*3/uL (ref 0.1–1.0)
Monocytes Relative: 9 %
Neutro Abs: 6.6 10*3/uL (ref 1.7–7.7)
Neutrophils Relative %: 79 %
Platelets: 371 10*3/uL (ref 150–400)
RBC: 3.18 MIL/uL — ABNORMAL LOW (ref 3.87–5.11)
RDW: 17.3 % — ABNORMAL HIGH (ref 11.5–15.5)
WBC: 8.5 10*3/uL (ref 4.0–10.5)
nRBC: 0 % (ref 0.0–0.2)

## 2021-10-10 LAB — BASIC METABOLIC PANEL
Anion gap: 11 (ref 5–15)
BUN: 15 mg/dL (ref 8–23)
CO2: 20 mmol/L — ABNORMAL LOW (ref 22–32)
Calcium: 8.3 mg/dL — ABNORMAL LOW (ref 8.9–10.3)
Chloride: 103 mmol/L (ref 98–111)
Creatinine, Ser: 0.92 mg/dL (ref 0.44–1.00)
GFR, Estimated: 60 mL/min — ABNORMAL LOW (ref >60)
Glucose, Bld: 118 mg/dL — ABNORMAL HIGH (ref 70–99)
Potassium: 4.1 mmol/L (ref 3.5–5.1)
Sodium: 134 mmol/L — ABNORMAL LOW (ref 135–145)

## 2021-10-10 LAB — IRON AND TIBC
Iron: 27 ug/dL — ABNORMAL LOW (ref 28–170)
Saturation Ratios: 15 % (ref 10.4–31.8)
TIBC: 175 ug/dL — ABNORMAL LOW (ref 250–450)
UIBC: 148 ug/dL

## 2021-10-10 LAB — MAGNESIUM: Magnesium: 2 mg/dL (ref 1.7–2.4)

## 2021-10-10 LAB — APTT: aPTT: 70 seconds — ABNORMAL HIGH (ref 24–36)

## 2021-10-10 LAB — HEPARIN LEVEL (UNFRACTIONATED): Heparin Unfractionated: >1.1 IU/mL — ABNORMAL HIGH (ref 0.30–0.70)

## 2021-10-10 LAB — VITAMIN B12: Vitamin B-12: 1063 pg/mL — ABNORMAL HIGH (ref 180–914)

## 2021-10-10 MED ORDER — DILTIAZEM HCL ER COATED BEADS 240 MG PO CP24
240.0000 mg | ORAL_CAPSULE | Freq: Every day | ORAL | Status: DC
  Administered 2021-10-11 – 2021-10-12 (×2): 240 mg via ORAL
  Filled 2021-10-10 (×4): qty 1

## 2021-10-10 MED ORDER — POLYETHYLENE GLYCOL 3350 17 G PO PACK
17.0000 g | PACK | Freq: Every day | ORAL | Status: DC
  Filled 2021-10-10: qty 1

## 2021-10-10 MED ORDER — SODIUM CHLORIDE 0.9 % IV SOLN: INTRAVENOUS | Status: DC

## 2021-10-10 MED ORDER — DEXTROSE IN LACTATED RINGERS 5 % IV SOLN: INTRAVENOUS | Status: AC

## 2021-10-10 MED ORDER — DILTIAZEM HCL-DEXTROSE 125-5 MG/125ML-% IV SOLN (PREMIX)
5.0000 mg/h | INTRAVENOUS | Status: DC
  Administered 2021-10-10 (×2): 10 mg/h via INTRAVENOUS
  Filled 2021-10-10: qty 125

## 2021-10-10 MED ORDER — APIXABAN 5 MG PO TABS: 5.0000 mg | ORAL_TABLET | Freq: Two times a day (BID) | ORAL | Status: DC

## 2021-10-10 MED ORDER — HALOPERIDOL LACTATE 5 MG/ML IJ SOLN: 1.0000 mg | Freq: Four times a day (QID) | INTRAMUSCULAR | Status: DC | PRN

## 2021-10-10 MED ORDER — HEPARIN (PORCINE) 25000 UT/250ML-% IV SOLN
750.0000 [IU]/h | INTRAVENOUS | Status: DC
Start: 2021-10-10 — End: 2021-10-10

## 2021-10-10 MED ORDER — APIXABAN 5 MG PO TABS
5.0000 mg | ORAL_TABLET | Freq: Two times a day (BID) | ORAL | Status: DC
  Filled 2021-10-10: qty 1

## 2021-10-10 MED ORDER — APIXABAN 2.5 MG PO TABS: 2.5000 mg | ORAL_TABLET | Freq: Two times a day (BID) | ORAL | Status: DC

## 2021-10-10 MED ORDER — ENOXAPARIN SODIUM 60 MG/0.6ML IJ SOSY
60.0000 mg | PREFILLED_SYRINGE | Freq: Two times a day (BID) | INTRAMUSCULAR | Status: DC
  Administered 2021-10-10 – 2021-10-12 (×4): 60 mg via SUBCUTANEOUS
  Filled 2021-10-10 (×4): qty 0.6

## 2021-10-10 NOTE — Progress Notes (Signed)
RN attempted to give patient medications. Patient very lethargic. Pt not following commands to swallow water. RN notified MD. RN made MD aware of continued cardizem gtt due to not given cardizem PO the current order in [facility]

## 2021-10-10 NOTE — TOC Progression Note (Signed)
Transition of Care [facility]) - Initial/Assessment Note    Patient Details  Name: Judy [name] MRN: [id number] Date of Birth: [birth year]  Transition of Care [facility]) CM/SW Contact:    Milinda Antis, [place] Phone Number: 10/10/2021, 9:37 AM  Clinical Narrative:                 CSW called spoke with the patient's med tech, Marita Kansas, at [facility].  The facility reports that they were assisting the patient with mobility prior to hospitalization and the patient can return when medically ready.    TOC will continue to follow.         Patient Goals and CMS Choice        Expected Discharge Plan and Services                                                Prior Living Arrangements/Services                       Activities of Daily Living      Permission Sought/Granted                  Emotional Assessment              Admission diagnosis:  Hypokalemia [E87.6] A-fib ([place]) [I48.91] Atrial fibrillation with rapid ventricular response (HCC) [I48.91] Face lacerations, initial encounter [S01.81XA] Fall, initial encounter [W19.XXXA] Pulmonary embolism, unspecified chronicity, unspecified pulmonary embolism type, unspecified whether acute cor pulmonale present ([place]) [I26.99] Patient Active Problem List   Diagnosis Date Noted   Hypomagnesemia 10/09/2021   Dementia without behavioral disturbance ([place]) 10/09/2021   FTT (failure to thrive) in adult 10/09/2021   A-fib ([place]) 10/08/2021   Paroxysmal atrial fibrillation with RVR ([place]) 09/21/2021   GERD (gastroesophageal reflux disease) 09/21/2021   Demand ischemia ([place]) 09/21/2021   Pulmonary emboli ([place]) 09/20/2021   New onset atrial fibrillation ([place]) 08/02/2021   Fall at home, initial encounter 08/02/2021   Rotator cuff tear arthropathy, right 08/02/2021   Chronic back pain 08/02/2021   DNR (do not resuscitate) 08/02/2021   Hypokalemia 09/07/2013   Cancer of breast, intraductal    HTN (hypertension)     Dyslipidemia    Breast cancer, left ([place]) 09/26/2011   DCIS (ductal carcinoma in situ) of breast 09/12/2011   PCP:  Mayra Neer, MD Pharmacy:   [facility] [address], [place] [address] Phone: [phone] Fax: [place], [place] [address] [facility] [place] [address] Phone: [phone] Fax: [phone]     Social Determinants of Health (SDOH) Interventions    Readmission Risk Interventions     No data to display

## 2021-10-10 NOTE — Progress Notes (Signed)
PROGRESS NOTE    Judy [name]  BTD:[id number] DOB: [birth year] DOA: 10/08/2021 PCP: Mayra Neer, MD     Brief Narrative:  H/o afib, systolic chf ( diagnosed in 07/2021), bilateral PE/RLE DVT ( diagnosed in 09/2021), sent from ALF due to fall, Hit head on wall while trying to help making bed at her facility, 1inch laceration to right forehead, sutured in the ED, found to be in afib/RVR , admitted to the hospital   Subjective:  Was agitated last night required restrain for safety , very sleepy this am, has not been able to take anything including meds by mouth No urine output documented last night , in and out cath drained 240cc around 9am, continue bladder scan, may needs indwelling foley if continue retain urine, might contribute to agitation overnight   In afib, heart rate improved   Assessment & Plan:  Active Problems:   Hypokalemia   Pulmonary emboli (HCC)   Paroxysmal atrial fibrillation with RVR (HCC)   Hypomagnesemia   Dementia without behavioral disturbance (HCC)   FTT (failure to thrive) in adult    Assessment and Plan: No notes have been filed under this hospital service. Service: Hospitalist   Afib/rvr, reason for admission  -not sure what drive the afib/rvr, heart rate improved on cardizem drip, not able to transition to oral meds due to sedation -keep on cardizem drip until able to take oral meds -on subQ lovenox untila able to resume eliquis -cardiology input appreciated   Systolic chf, lvef 10-62% in 07/2021, appear dehydrated , currently on hydration, monitor volume status  Urinary retention Required in and out cath on 8/27 9am, continue bladder scan, may needs indwelling foley if continue retain urine, might contribute to agitation overnight   Hypok/mag Lasix and hctz on home med list Also has poor Oral intake Replaced and normalized   Right pleural effusion with h/o right side breast cancer Clinically appear dry, no apparent malignancy on imaging  obtained in the ED She is on room air, does not appear to be symptomatic from this, not sure this is what is driving the afib/rvr reactive effusion from recent PE ? vs malignancy?  does not appear volume overloaded  Family is weighing risk and benefit of diagnostic thoracentesis , per IR does not need to hold anticoagulation for procedure if family decide to proceed Given overall poor prognosis, family agree with palliative care consult , order placed    Bilateral PE/RLE DVT , diagnosed several weeks ago On room air On anticoagulation     Anemia No overt blood loss  Iron sts 15%, b12 unremarkable  Monitor hgb   FTT/ increased falls from 07/2021, poor appetite, ongoing weight loss -fall precaution, ensure when able to take oral , consider Marinol vs remeron for appetite booster ( family will discuss it) -CT abdomen chest pelvis, CT cervical spine, CT maxillofacial, CT of the head no acute findings -overall Poor prognosis, family agree with palliative care consult , order placed    Right forehead laceration Status post suture in the ED on 8/25, sutures need to be removed in 5 days  Dementia, appear progressive  Delirium, appear sedated this am, family would like to hold off head imaging for now, family would like to discuss goals of care with palliative care Family would like to be contacted before any sedative meds to be given . I have Reviewed nursing notes, Vitals, pain scores, I/o's, Lab results and  imaging results since pt's last encounter, details please see discussion above  I ordered the following labs:  Unresulted Labs (From admission, onward)     Start     Ordered   10/09/21 0630  Basic metabolic panel  Daily at 5am,   R     Comments: As Scheduled for 5 days    10/08/21 1623   Unscheduled  Occult blood card to lab, stool  As needed,   R      10/09/21 1021             DVT prophylaxis:    Code Status:   Code Status: DNR  Family Communication: daughter in  law over the phone on 8/26 and 8/27 Disposition:    Dispo: The patient is from: ALF              Anticipated d/c is to: ALF vs SNF              Anticipated d/c date is: pending clinical improvement, need palliative consult  Antimicrobials:    Anti-infectives (From admission, onward)    None          Objective: Vitals:   10/10/21 0755 10/10/21 0800 10/10/21 1241 10/10/21 1514  BP: 101/75 98/62 112/65 106/70  Pulse: 90 94 99 97  Resp: (!) 26 19 (!) 26 (!) 26  Temp: 97.6 F (36.4 C)  98.1 F (36.7 C) 98.6 F (37 C)  TempSrc: Axillary  Axillary Axillary  SpO2: 99% 97% 98% 100%  Weight:      Height:        Intake/Output Summary (Last 24 hours) at 10/10/2021 1556 Last data filed at 10/10/2021 0325 Gross per 24 hour  Intake 496.41 ml  Output 0 ml  Net 496.41 ml   Filed Weights   10/08/21 2129 10/09/21 0607 10/10/21 0659  Weight: 53.7 kg 54.7 kg 58.8 kg    Examination:  General exam: sedated , open eyes to voice briefly Respiratory system: diminished right lower side, no wheezing, no rales, no rhonchi. Respiratory effort normal. Cardiovascular system:  IRRR.  Gastrointestinal system: Abdomen is nondistended, soft and nontender.  Normal bowel sounds heard. Central nervous system: sedated  Extremities:  no edema Skin: No rashes, lesions or ulcers Psychiatry: sedated     Data Reviewed: I have personally reviewed  labs and visualized  imaging studies since the last encounter and formulate the plan        Scheduled Meds:  acetaminophen  1,000 mg Oral BID   acidophilus  1 capsule Oral Daily   atorvastatin  10 mg Oral QHS   diltiazem  240 mg Oral Daily   enoxaparin (LOVENOX) injection  60 mg Subcutaneous Q12H   feeding supplement  237 mL Oral BID BM   metoprolol tartrate  100 mg Oral BID   polyethylene glycol  17 g Oral Daily   QUEtiapine  25 mg Oral QHS   senna-docusate  1 tablet Oral BID   sodium chloride flush  3 mL Intravenous Q12H   Continuous  Infusions:  sodium chloride     dextrose 5% lactated ringers       LOS: 2 days     Florencia Reasons, MD PhD FACP [facility]  Available via Epic secure chat 7am-7pm for nonurgent issues Please page for urgent issues To page the attending provider between 7A-7P or the covering provider during after hours 7P-7A, please log into the web site [email or web address] and access using universal [place] password for that web site. If you do not have the password, please call the hospital  operator.    10/10/2021, 3:56 PM

## 2021-10-10 NOTE — Plan of Care (Signed)
  Problem: Clinical Measurements: Goal: Respiratory complications will improve Outcome: Progressing   Problem: Nutrition: Goal: Adequate nutrition will be maintained Outcome: Progressing   Problem: Elimination: Goal: Will not experience complications related to urinary retention Outcome: Progressing   Problem: Pain Managment: Goal: General experience of comfort will improve Outcome: Progressing   Problem: Safety: Goal: Ability to remain free from injury will improve Outcome: Progressing   Problem: Skin Integrity: Goal: Risk for impaired skin integrity will decrease Outcome: Progressing   Problem: Safety: Goal: Non-violent Restraint(s) Outcome: Progressing   Problem: Activity: Goal: Risk for activity intolerance will decrease Outcome: Not Progressing   Problem: Coping: Goal: Level of anxiety will decrease Outcome: Not Progressing   Problem: Elimination: Goal: Will not experience complications related to bowel motility Outcome: Not Progressing

## 2021-10-10 NOTE — Progress Notes (Signed)
During q2hr restraint assessment I noticed swelling on her left arm. Restraints removed and  wrapped kirlex was discovered . Kirlex removed and pt arm is currently elevated with pillow to reduce the swelling. Restraints reapplied. Pt denied pain.

## 2021-10-10 NOTE — Progress Notes (Signed)
Interventional Radiology Brief Note:  Judy [name] is an 88 year female with recent history of bilateral pulmonary emboli with much large clot burden on the right, right-sided heart strain who now presents to [facility] ED after fall with head laceration at care facility.  Patient found to be in a fib with RVR in ED and CXR showed right pleural effusion.  IR consulted for right thoracentesis.   Discussed with Dr. Annamaria Boots and Dr. Erlinda Hong.  Also discussed extensively at bedside with patient's daughter in law, Doris, as well as Market researcher by speaker phone.  Discussed the potential risks, benefits, and potential complications.  They would like to hold on thoracentesis at this time.   Patient assessed at bedside.  She is in restraints with some flailing during RN/NT attempt to clean and change. Patient would not likely be able to cooperate during procedure.   Agree with plan for medical management.   IR remains available.   Brynda Greathouse, MS RD PA-C

## 2021-10-10 NOTE — Progress Notes (Signed)
ANTICOAGULATION CONSULT NOTE   Pharmacy Consult for enoxaparin Indication: atrial fibrillation & PE/DVT  Allergies  Allergen Reactions   Amoxil [Amoxicillin] Nausea Only   Codeine Nausea Only   Aricept [Donepezil Hcl] Other (See Comments)    "Felt drunk" Headaches Drowsiness   Catapres [Clonidine] Other (See Comments)    Dry mouth   Hycodan [Hydrocodone Bit-Homatrop Mbr] Diarrhea and Nausea And Vomiting   Norco [Hydrocodone-Acetaminophen] Diarrhea and Nausea And Vomiting   Norvasc [Amlodipine] Swelling   Prolia [Denosumab] Other (See Comments)    Unknown reaction   Ultram [Tramadol] Nausea Only   Zoloft [Sertraline] Nausea Only   Clavulanic Acid Hives and Rash    Patient Measurements: Height: '5\' 4"'  (162.6 cm) Weight: 58.8 kg (129 lb 10.1 oz) IBW/kg (Calculated) : 54.7 Heparin Dosing Weight: 55.6 kg  Vital Signs: Temp: 98.6 F (37 C) (08/27 1514) Temp Source: Axillary (08/27 1514) BP: 106/70 (08/27 1514) Pulse Rate: 97 (08/27 1514)  Labs: Recent Labs    10/08/21 0839 10/08/21 0839 10/08/21 2047 10/09/21 0630 10/09/21 2149 10/10/21 0038  HGB 10.5*  --   --  10.5*  --  9.8*  HCT 32.2*  --   --  31.9*  --  28.5*  PLT 434*  --   --  416*  --  371  APTT  --    < > 72* 125* 65* 70*  LABPROT  --   --  21.9*  --   --   --   INR  --   --  1.9*  --   --   --   HEPARINUNFRC  --   --  >1.10* >1.10*  --  >1.10*  CREATININE 0.69  --   --  0.76  --  0.92   < > = values in this interval not displayed.     Estimated Creatinine Clearance: 35.8 mL/min (by C-G formula based on SCr of 0.92 mg/dL).   Medical History: Past Medical History:  Diagnosis Date   Blood transfusion    Breast cancer, left ([place]) 09/26/2011   Punch biopsies of the left nipple done on 09/16/2011.  ER and PR were negative.  HER2 is positive with ratio 4.68.Had mastectomy on 10/25/11. Path T1,N0.    Chronic low back pain    Dyslipidemia    GERD (gastroesophageal reflux disease)    HTN (hypertension)     Osteoarthritis    Osteoporosis       Scheduled:   acetaminophen  1,000 mg Oral BID   acidophilus  1 capsule Oral Daily   apixaban  5 mg Oral BID   atorvastatin  10 mg Oral QHS   diltiazem  240 mg Oral Daily   feeding supplement  237 mL Oral BID BM   metoprolol tartrate  100 mg Oral BID   polyethylene glycol  17 g Oral Daily   QUEtiapine  25 mg Oral QHS   senna-docusate  1 tablet Oral BID   sodium chloride flush  3 mL Intravenous Q12H   Infusions:   sodium chloride     sodium chloride 75 mL/hr at 10/10/21 0849   PRN: sodium chloride, acetaminophen, enalaprilat, haloperidol lactate, HYDROmorphone (DILAUDID) injection, ondansetron (ZOFRAN) IV, sodium chloride flush  Assessment: 34 yoF admitted with AF. Pt also has hx VTE on apixaban prior to admit. Pt was started on IV heparin with possible need for thoracentesis, then transitioned back to apixaban. Pt unable to tolerate PO medications at this time though due to extreme agitation. Pharmacy asked to  dose enoxaparin. No apixaban given this am, heparin stopped earlier this morning. CrCl ~35, CBC stable.   Goal of Therapy:  Enoxaparin level 0.6-1 Monitor platelets by anticoagulation protocol: Yes   Plan:  Hold apixaban for now Enoxaparin 63m/kg q12h  MArrie Senate PharmD, BCPS, BLa Palma Intercommunity HospitalClinical Pharmacist [phone] check AMION for all MBarstow Community HospitalPharmacy numbers 10/10/2021

## 2021-10-10 NOTE — Progress Notes (Signed)
DAILY PROGRESS NOTE   Patient Name: Judy [name] Date of Encounter: 10/10/2021 Cardiologist: None  Chief Complaint   No complaints  Patient Profile   88 yo female with afib and RVR as well as history of chronic systolic CHF with LVEF 17-61%  Subjective   Very difficult to understand. Afib appears rate controlled. No s/s of volume overload.  Objective   Vitals:   10/09/21 2118 10/10/21 0300 10/10/21 0659 10/10/21 0755  BP: 125/79   101/75  Pulse: 87  93 90  Resp: 20  14 (!) 26  Temp: 97.6 F (36.4 C) 97.7 F (36.5 C)  97.6 F (36.4 C)  TempSrc: Axillary Oral  Axillary  SpO2: 94%  100% 99%  Weight:   58.8 kg   Height:        Intake/Output Summary (Last 24 hours) at 10/10/2021 0914 Last data filed at 10/10/2021 0325 Gross per 24 hour  Intake 496.41 ml  Output 0 ml  Net 496.41 ml   Filed Weights   10/08/21 2129 10/09/21 0607 10/10/21 0659  Weight: 53.7 kg 54.7 kg 58.8 kg    Physical Exam   General appearance: alert and confused Lungs: clear to auscultation bilaterally Heart: irregularly irregular rhythm Abdomen: soft, non-tender; bowel sounds normal; no masses,  no organomegaly Extremities: extremities normal, atraumatic, no cyanosis or edema Neurologic: Mental status: confused, difficult to understand speech  Inpatient Medications    Scheduled Meds:  acetaminophen  1,000 mg Oral BID   acidophilus  1 capsule Oral Daily   atorvastatin  10 mg Oral QHS   feeding supplement  237 mL Oral BID BM   metoprolol tartrate  100 mg Oral BID   polyethylene glycol  17 g Oral Daily   QUEtiapine  25 mg Oral QHS   senna-docusate  1 tablet Oral BID   sodium chloride flush  3 mL Intravenous Q12H    Continuous Infusions:  sodium chloride     sodium chloride 75 mL/hr at 10/10/21 0849   diltiazem (CARDIZEM) infusion 10 mg/hr (10/10/21 0325)   heparin 750 Units/hr (10/10/21 0325)    PRN Meds: sodium chloride, acetaminophen, enalaprilat, haloperidol lactate,  HYDROmorphone (DILAUDID) injection, metoprolol tartrate, ondansetron (ZOFRAN) IV, sodium chloride flush   Labs   Results for orders placed or performed during the hospital encounter of 10/08/21 (from the past 48 hour(s))  Potassium     Status: Abnormal   Collection Time: 10/08/21  8:47 PM  Result Value Ref Range   Potassium 3.0 (L) 3.5 - 5.1 mmol/L    Comment: Performed at [facility], [address] [place], [place] [address]  Magnesium     Status: Abnormal   Collection Time: 10/08/21  8:47 PM  Result Value Ref Range   Magnesium 1.3 (L) 1.7 - 2.4 mg/dL    Comment: Performed at [facility] [address], [place], [place] [address]  Phosphorus     Status: None   Collection Time: 10/08/21  8:47 PM  Result Value Ref Range   Phosphorus 2.7 2.5 - 4.6 mg/dL    Comment: Performed at [facility], [place] [address], [place],  [address]  APTT     Status: Abnormal   Collection Time: 10/08/21  8:47 PM  Result Value Ref Range   aPTT 72 (H) 24 - 36 seconds    Comment:        IF BASELINE aPTT IS ELEVATED, SUGGEST PATIENT RISK ASSESSMENT BE USED TO DETERMINE APPROPRIATE  ANTICOAGULANT THERAPY. Performed at [facility], [place] [address], [place], [place] [address]   Heparin level (unfractionated)     Status: Abnormal   Collection Time: 10/08/21  8:47 PM  Result Value Ref Range   Heparin Unfractionated >1.10 (H) 0.30 - 0.70 IU/mL    Comment: (NOTE) The clinical reportable range upper limit is being lowered to >1.10 to align with the FDA approved guidance for the current laboratory assay.  If heparin results are below expected values, and patient dosage has  been confirmed, suggest follow up testing of antithrombin III levels. Performed at [facility], [place] [address] [place], [place] [address]   Protime-INR     Status: Abnormal   Collection Time: 10/08/21  8:47 PM  Result Value Ref Range   Prothrombin Time 21.9 (H) 11.4 - 15.2 seconds   INR 1.9  (H) 0.8 - 1.2    Comment: (NOTE) INR goal varies based on device and disease states. Performed at [facility], [place] [address], [place], [place] [address]   MRSA Next Gen by PCR, Nasal     Status: None   Collection Time: 10/08/21 10:36 PM   Specimen: Nasal Mucosa; Nasal Swab  Result Value Ref Range   MRSA by PCR Next Gen NOT DETECTED NOT DETECTED    Comment: (NOTE) The GeneXpert MRSA Assay (FDA approved for NASAL specimens only), is one component of a comprehensive MRSA colonization surveillance program. It is not intended to diagnose MRSA infection nor to guide or monitor treatment for MRSA infections. Test performance is not FDA approved in patients less than 42 years old. Performed at [facility], [place] [address] [place], [place] [address]   Heparin level (unfractionated)     Status: Abnormal   Collection Time: 10/09/21  6:30 AM  Result Value Ref Range   Heparin Unfractionated >1.10 (H) 0.30 - 0.70 IU/mL    Comment: (NOTE) The clinical reportable range upper limit is being lowered to >1.10 to align with the FDA approved guidance for the current laboratory assay.  If heparin results are below expected values, and patient dosage has  been confirmed, suggest follow up testing of antithrombin III levels. Performed at [facility], [place] [address] [place], [place] [address]   Basic metabolic panel     Status: Abnormal   Collection Time: 10/09/21  6:30 AM  Result Value Ref Range   Sodium 133 (L) 135 - 145 mmol/L   Potassium 4.3 3.5 - 5.1 mmol/L   Chloride 101 98 - 111 mmol/L   CO2 20 (L) 22 - 32 mmol/L   Glucose, Bld 110 (H) 70 - 99 mg/dL    Comment: Glucose reference range applies only to samples taken after fasting for at least 8 hours.   BUN 11 8 - 23 mg/dL   Creatinine, Ser 0.76 0.44 - 1.00 mg/dL   Calcium 8.5 (L) 8.9 - 10.3 mg/dL   GFR, Estimated >60 >60 mL/min    Comment: (NOTE) Calculated using the CKD-EPI Creatinine Equation (2021)    Anion  gap 12 5 - 15    Comment: Performed at [facility] [address] [place] [address]  CBC     Status: Abnormal   Collection Time: 10/09/21  6:30 AM  Result Value Ref Range   WBC 8.4 4.0 - 10.5 K/uL   RBC 3.49 (L) 3.87 - 5.11 MIL/uL   Hemoglobin 10.5 (L) 12.0 - 15.0 g/dL   HCT  31.9 (L) 36.0 - 46.0 %   MCV 91.4 80.0 - 100.0 fL   MCH 30.1 26.0 - 34.0 pg   MCHC 32.9 30.0 - 36.0 g/dL   RDW 17.4 (H) 11.5 - 15.5 %   Platelets 416 (H) 150 - 400 K/uL   nRBC 0.0 0.0 - 0.2 %    Comment: Performed at [facility] [address], [place], [place] [address]  APTT     Status: Abnormal   Collection Time: 10/09/21  6:30 AM  Result Value Ref Range   aPTT 125 (H) 24 - 36 seconds    Comment:        IF BASELINE aPTT IS ELEVATED, SUGGEST PATIENT RISK ASSESSMENT BE USED TO DETERMINE APPROPRIATE ANTICOAGULANT THERAPY. Performed at [facility], [place] [address] [place], [place] [address]   Glucose, capillary     Status: Abnormal   Collection Time: 10/09/21 11:45 AM  Result Value Ref Range   Glucose-Capillary 111 (H) 70 - 99 mg/dL    Comment: Glucose reference range applies only to samples taken after fasting for at least 8 hours.  APTT     Status: Abnormal   Collection Time: 10/09/21  9:49 PM  Result Value Ref Range   aPTT 65 (H) 24 - 36 seconds    Comment:        IF BASELINE aPTT IS ELEVATED, SUGGEST PATIENT RISK ASSESSMENT BE USED TO DETERMINE APPROPRIATE ANTICOAGULANT THERAPY. Performed at [facility], [place] [address] [place] [place], [place] [address]   Basic metabolic panel     Status: Abnormal   Collection Time: 10/10/21 12:38 AM  Result Value Ref Range   Sodium 134 (L) 135 - 145 mmol/L   Potassium 4.1 3.5 - 5.1 mmol/L   Chloride 103 98 - 111 mmol/L   CO2 20 (L) 22 - 32 mmol/L   Glucose, Bld 118 (H) 70 - 99 mg/dL    Comment: Glucose reference range applies only to samples taken after fasting for at least 8 hours.   BUN 15 8 - 23 mg/dL   Creatinine, Ser  0.92 0.44 - 1.00 mg/dL   Calcium 8.3 (L) 8.9 - 10.3 mg/dL   GFR, Estimated 60 (L) >60 mL/min    Comment: (NOTE) Calculated using the CKD-EPI Creatinine Equation (2021)    Anion gap 11 5 - 15    Comment: Performed at [facility] [address], [place], [place] [address]  Heparin level (unfractionated)     Status: Abnormal   Collection Time: 10/10/21 12:38 AM  Result Value Ref Range   Heparin Unfractionated >1.10 (H) 0.30 - 0.70 IU/mL    Comment: (NOTE) The clinical reportable range upper limit is being lowered to >1.10 to align with the FDA approved guidance for the current laboratory assay.  If heparin results are below expected values, and patient dosage has  been confirmed, suggest follow up testing of antithrombin III levels. Performed at [facility], [place] [address], [place], [place] [address]   APTT     Status: Abnormal   Collection Time: 10/10/21 12:38 AM  Result Value Ref Range   aPTT 70 (H) 24 - 36 seconds    Comment:        IF BASELINE aPTT IS ELEVATED, SUGGEST PATIENT RISK ASSESSMENT BE USED TO DETERMINE APPROPRIATE ANTICOAGULANT THERAPY. Performed at [facility], [place] [address] [place], [place] [address]   Iron and TIBC     Status: Abnormal  Collection Time: 10/10/21 12:38 AM  Result Value Ref Range   Iron 27 (L) 28 - 170 ug/dL   TIBC 175 (L) 250 - 450 ug/dL   Saturation Ratios 15 10.4 - 31.8 %   UIBC 148 ug/dL    Comment: Performed at [facility], [place] [address] [place], [place] [address]  CBC with Differential/Platelet     Status: Abnormal   Collection Time: 10/10/21 12:38 AM  Result Value Ref Range   WBC 8.5 4.0 - 10.5 K/uL   RBC 3.18 (L) 3.87 - 5.11 MIL/uL   Hemoglobin 9.8 (L) 12.0 - 15.0 g/dL   HCT 28.5 (L) 36.0 - 46.0 %   MCV 89.6 80.0 - 100.0 fL   MCH 30.8 26.0 - 34.0 pg   MCHC 34.4 30.0 - 36.0 g/dL   RDW 17.3 (H) 11.5 - 15.5 %   Platelets 371 150 - 400 K/uL   nRBC 0.0 0.0 - 0.2 %   Neutrophils Relative % 79 %    Neutro Abs 6.6 1.7 - 7.7 K/uL   Lymphocytes Relative 12 %   Lymphs Abs 1.0 0.7 - 4.0 K/uL   Monocytes Relative 9 %   Monocytes Absolute 0.8 0.1 - 1.0 K/uL   Eosinophils Relative 0 %   Eosinophils Absolute 0.0 0.0 - 0.5 K/uL   Basophils Relative 0 %   Basophils Absolute 0.0 0.0 - 0.1 K/uL   Immature Granulocytes 0 %   Abs Immature Granulocytes 0.03 0.00 - 0.07 K/uL    Comment: Performed at [facility] [address], [place], [place] [address]  Magnesium     Status: None   Collection Time: 10/10/21 12:38 AM  Result Value Ref Range   Magnesium 2.0 1.7 - 2.4 mg/dL    Comment: Performed at [facility] [address], [place], [place] [address]  Vitamin B12     Status: Abnormal   Collection Time: 10/10/21 12:38 AM  Result Value Ref Range   Vitamin B-12 1,063 (H) 180 - 914 pg/mL    Comment: (NOTE) This assay is not validated for testing neonatal or myeloproliferative syndrome specimens for Vitamin B12 levels. Performed at [facility], [place] [address], [place], [place] [address]     ECG   N/A  Telemetry   Afib with CVR - Personally Reviewed  Radiology    CT CHEST ABDOMEN PELVIS W CONTRAST  Result Date: 10/08/2021 CLINICAL DATA:  Blunt polytrauma EXAM: CT CHEST, ABDOMEN, AND PELVIS WITH CONTRAST TECHNIQUE: Multidetector CT imaging of the chest, abdomen and pelvis was performed following the standard protocol during bolus administration of intravenous contrast. RADIATION DOSE REDUCTION: This exam was performed according to the departmental dose-optimization program which includes automated exposure control, adjustment of the mA and/or kV according to patient size and/or use of iterative reconstruction technique. CONTRAST:  61m OMNIPAQUE IOHEXOL 300 MG/ML SOLN IV. No oral contrast. COMPARISON:  CT angio chest 09/20/2021, CT abdomen and pelvis 10/04/2006 FINDINGS: CT CHEST FINDINGS Cardiovascular: Aorta normal caliber. Scattered atherosclerotic calcifications  aorta in coronary arteries. Enlargement of cardiac chambers greatest at RIGHT atrium. No pericardial effusion. Pulmonary arteries adequately opacified. Large filling defect again identified within RIGHT pulmonary artery consistent with pulmonary embolism. Occlusive thrombus extends into RIGHT middle and RIGHT lower lobe pulmonary arteries. Smaller pulmonary emboli are seen in BILATERAL upper and LEFT lower lobe pulmonary arteries. These were all evident on the prior study as well. Mediastinum/Nodes: Esophagus unremarkable. Base of cervical region normal appearance. No  thoracic adenopathy. Lungs/Pleura: Moderate RIGHT pleural effusion. Subsegmental atelectasis RIGHT lower and RIGHT middle lobes. Subpleural interstitial changes LEFT lung again seen. No new infiltrate or pneumothorax. Musculoskeletal: Diffuse osseous demineralization. LEFT breast prosthesis. CT ABDOMEN PELVIS FINDINGS Hepatobiliary: Gallbladder and liver normal appearance Pancreas: Few scattered parenchymal calcifications. Atrophic. No definite mass. Spleen: Normal appearance Adrenals/Urinary Tract: Nonobstructing LEFT renal calculus. Adrenal glands, kidneys, ureters, and bladder otherwise normal appearance. Stomach/Bowel: Appendix not visualized. Scattered stool throughout colon. Uncomplicated sigmoid diverticula. Stomach and bowel loops otherwise unremarkable. Vascular/Lymphatic: Atherosclerotic calcifications aorta and iliac arteries without aneurysm. No adenopathy. Reproductive: Uterus surgically absent with nonvisualization of ovaries Other: No free air or free fluid.  No hernia. Musculoskeletal: Osseous demineralization with postsurgical changes of posterior L5-S1 fusion. IMPRESSION: BILATERAL pulmonary emboli as above with much larger clot burden on RIGHT, unchanged since 09/20/2021, with persistent enlargement of cardiac chambers particularly RIGHT atrium. Moderate RIGHT pleural effusion with subsegmental atelectasis at RIGHT lung base. No  acute intra-abdominal or intrapelvic abnormalities. Nonobstructing LEFT renal calculus. Uncomplicated sigmoid diverticulosis. Aortic Atherosclerosis (ICD10-I70.0). Findings discussed with Dr. Alvino Chapel on 10/08/2021 at 1249 hours. Electronically Signed   By: Lavonia Dana M.D.   On: 10/08/2021 12:49    Cardiac Studies   N/A  Assessment   Active Problems:   Hypokalemia   Pulmonary emboli (HCC)   Paroxysmal atrial fibrillation with RVR (HCC)   Hypomagnesemia   Dementia without behavioral disturbance (HCC)   FTT (failure to thrive) in adult   Plan   AFib is rate controlled on current medications. Appears she is ordered for a diet and taking po meds. Will switch to heparin to DOAC and IV diltiazem to po today.  Time Spent Directly with Patient:  I have spent a total of 25 minutes with the patient reviewing hospital notes, telemetry, EKGs, labs and examining the patient as well as establishing an assessment and plan that was discussed personally with the patient.  > 50% of time was spent in direct patient care.  Length of Stay:  LOS: 2 days   Pixie Casino, MD, [facility], [place] Director of the [facility] Lipid Disorders &  Cardiovascular Risk Reduction Clinic Diplomate of the American Board of Clinical Lipidology Attending Cardiologist  Direct Dial: [phone]  Fax: [phone]  Website:  [email or web address] 10/10/2021, 9:14 AM

## 2021-10-10 NOTE — Plan of Care (Signed)
  Problem: Education: Goal: Knowledge of General Education information will improve Description: Including pain rating scale, medication(s)/side effects and non-pharmacologic comfort measures Outcome: Progressing   Problem: Health Behavior/Discharge Planning: Goal: Ability to manage health-related needs will improve Outcome: Progressing   Problem: Clinical Measurements: Goal: Will remain free from infection Outcome: Progressing Goal: Cardiovascular complication will be avoided Outcome: Progressing   Problem: Coping: Goal: Level of anxiety will decrease Outcome: Progressing   Problem: Pain Managment: Goal: General experience of comfort will improve Outcome: Progressing   Problem: Safety: Goal: Ability to remain free from injury will improve Outcome: Progressing   Problem: Skin Integrity: Goal: Risk for impaired skin integrity will decrease Outcome: Progressing   Problem: Safety: Goal: Non-violent Restraint(s) Outcome: Progressing

## 2021-10-11 DIAGNOSIS — I2699 Other pulmonary embolism without acute cor pulmonale: Secondary | ICD-10-CM | POA: Diagnosis not present

## 2021-10-11 DIAGNOSIS — I4891 Unspecified atrial fibrillation: Secondary | ICD-10-CM

## 2021-10-11 DIAGNOSIS — F03911 Unspecified dementia, unspecified severity, with agitation: Secondary | ICD-10-CM | POA: Diagnosis not present

## 2021-10-11 DIAGNOSIS — Z7189 Other specified counseling: Secondary | ICD-10-CM

## 2021-10-11 DIAGNOSIS — R627 Adult failure to thrive: Secondary | ICD-10-CM | POA: Diagnosis not present

## 2021-10-11 DIAGNOSIS — Z515 Encounter for palliative care: Secondary | ICD-10-CM

## 2021-10-11 DIAGNOSIS — W19XXXA Unspecified fall, initial encounter: Secondary | ICD-10-CM

## 2021-10-11 LAB — BASIC METABOLIC PANEL
Anion gap: 3 — ABNORMAL LOW (ref 5–15)
BUN: 18 mg/dL (ref 8–23)
CO2: 24 mmol/L (ref 22–32)
Calcium: 7.7 mg/dL — ABNORMAL LOW (ref 8.9–10.3)
Chloride: 105 mmol/L (ref 98–111)
Creatinine, Ser: 0.96 mg/dL (ref 0.44–1.00)
GFR, Estimated: 57 mL/min — ABNORMAL LOW (ref >60)
Glucose, Bld: 153 mg/dL — ABNORMAL HIGH (ref 70–99)
Potassium: 3.4 mmol/L — ABNORMAL LOW (ref 3.5–5.1)
Sodium: 132 mmol/L — ABNORMAL LOW (ref 135–145)

## 2021-10-11 LAB — CBC
HCT: 23.5 % — ABNORMAL LOW (ref 36.0–46.0)
HCT: 32.4 % — ABNORMAL LOW (ref 36.0–46.0)
Hemoglobin: 7.2 g/dL — ABNORMAL LOW (ref 12.0–15.0)
Hemoglobin: 10.6 g/dL — ABNORMAL LOW (ref 12.0–15.0)
MCH: 30.8 pg (ref 26.0–34.0)
MCH: 30.8 pg (ref 26.0–34.0)
MCHC: 30.6 g/dL (ref 30.0–36.0)
MCHC: 32.7 g/dL (ref 30.0–36.0)
MCV: 100.4 fL — ABNORMAL HIGH (ref 80.0–100.0)
MCV: 94.2 fL (ref 80.0–100.0)
Platelets: 265 10*3/uL (ref 150–400)
Platelets: 371 10*3/uL (ref 150–400)
RBC: 2.34 MIL/uL — ABNORMAL LOW (ref 3.87–5.11)
RBC: 3.44 MIL/uL — ABNORMAL LOW (ref 3.87–5.11)
RDW: 18.6 % — ABNORMAL HIGH (ref 11.5–15.5)
RDW: 18.1 % — ABNORMAL HIGH (ref 11.5–15.5)
WBC: 9.4 10*3/uL (ref 4.0–10.5)
WBC: 13.2 10*3/uL — ABNORMAL HIGH (ref 4.0–10.5)
nRBC: 0 % (ref 0.0–0.2)
nRBC: 0 % (ref 0.0–0.2)

## 2021-10-11 LAB — URINALYSIS, ROUTINE W REFLEX MICROSCOPIC
Glucose, UA: NEGATIVE mg/dL
Ketones, ur: NEGATIVE mg/dL
Nitrite: NEGATIVE
Protein, ur: NEGATIVE mg/dL
Specific Gravity, Urine: >1.03 — ABNORMAL HIGH (ref 1.005–1.030)
pH: 5 (ref 5.0–8.0)

## 2021-10-11 LAB — URINALYSIS, MICROSCOPIC (REFLEX): RBC / HPF: >50 RBC/hpf (ref 0–5)

## 2021-10-11 LAB — GLUCOSE, CAPILLARY: Glucose-Capillary: 144 mg/dL — ABNORMAL HIGH (ref 70–99)

## 2021-10-11 MED ORDER — CHLORHEXIDINE GLUCONATE CLOTH 2 % EX PADS
6.0000 | MEDICATED_PAD | Freq: Every day | CUTANEOUS | Status: DC
  Administered 2021-10-12: 6 via TOPICAL

## 2021-10-11 MED ORDER — POTASSIUM CHLORIDE 10 MEQ/100ML IV SOLN
10.0000 meq | INTRAVENOUS | Status: AC
  Administered 2021-10-11 (×2): 10 meq via INTRAVENOUS
  Filled 2021-10-11 (×3): qty 100

## 2021-10-11 MED ORDER — POTASSIUM CHLORIDE 20 MEQ PO PACK: 40.0000 meq | PACK | Freq: Once | ORAL | Status: DC

## 2021-10-11 NOTE — Progress Notes (Signed)
Pt declined HV TOC clinic appt upon DC from hospitalization. Pt educated on benefits of HV TOC. Pt education complete regarding HF patient booklet.   Per bedside nurse, pt has been refusing several medications including PO cardizem.  Available for reassessment if needed.   Pricilla Holm, MSN, RN

## 2021-10-11 NOTE — Progress Notes (Signed)
Rounding Note    Patient Name: Judy [name] Date of Encounter: 10/11/2021  [place] Cardiologist: Pixie Casino, MD   Subjective   Asleep, appears comfortable. Awakens easily but cannot obtain clear history.  Inpatient Medications    Scheduled Meds:  acetaminophen  1,000 mg Oral BID   acidophilus  1 capsule Oral Daily   atorvastatin  10 mg Oral QHS   diltiazem  240 mg Oral Daily   enoxaparin (LOVENOX) injection  60 mg Subcutaneous Q12H   feeding supplement  237 mL Oral BID BM   metoprolol tartrate  100 mg Oral BID   polyethylene glycol  17 g Oral Daily   QUEtiapine  25 mg Oral QHS   senna-docusate  1 tablet Oral BID   sodium chloride flush  3 mL Intravenous Q12H   Continuous Infusions:  sodium chloride     dextrose 5% lactated ringers 75 mL/hr at 10/11/21 0644   PRN Meds: sodium chloride, acetaminophen, enalaprilat, HYDROmorphone (DILAUDID) injection, ondansetron (ZOFRAN) IV, sodium chloride flush   Vital Signs    Vitals:   10/11/21 0437 10/11/21 0700 10/11/21 1100 10/11/21 1500  BP:  112/68 110/73 121/73  Pulse:  90 86 89  Resp:  15 19 (!) 26  Temp:  97.8 F (36.6 C) 98 F (36.7 C) 98 F (36.7 C)  TempSrc:  Oral Oral Oral  SpO2:  100% 100% 91%  Weight: 57.6 kg     Height:        Intake/Output Summary (Last 24 hours) at 10/11/2021 1614 Last data filed at 10/10/2021 1847 Gross per 24 hour  Intake 867.25 ml  Output --  Net 867.25 ml      10/11/2021    4:37 AM 10/10/2021    6:59 AM 10/09/2021    6:07 AM  Last 3 Weights  Weight (lbs) 126 lb 15.8 oz 129 lb 10.1 oz 120 lb 9.5 oz  Weight (kg) 57.6 kg 58.8 kg 54.7 kg      Telemetry    Atrial fibrillation, rate controlled - Personally Reviewed  ECG    No new since 8/26 - Personally Reviewed  Physical Exam   GEN: appears comfortable NECK: No JVD CARDIAC: irregularly irregular rhythm, normal S1 and S2, no rubs or gallops. No murmur. VASCULAR: Radial and DP pulses 2+ bilaterally. No  carotid bruits RESPIRATORY:  Clear to auscultation without rales, wheezing or rhonchi  ABDOMEN: Soft, non-tender, non-distended MUSCULOSKELETAL:  moves all 4 limbs independently SKIN: Warm and dry, no edema  Labs    High Sensitivity Troponin:   Recent Labs  Lab 09/20/21 1419 09/20/21 1616 09/21/21 0603 09/21/21 0817  TROPONINIHS 23* 21* 43* 47*     Chemistry Recent Labs  Lab 10/08/21 2047 10/09/21 0630 10/10/21 0038 10/11/21 0533  NA  --  133* 134* 132*  K 3.0* 4.3 4.1 3.4*  CL  --  101 103 105  CO2  --  20* 20* 24  GLUCOSE  --  110* 118* 153*  BUN  --  '11 15 18  '$ CREATININE  --  0.76 0.92 0.96  CALCIUM  --  8.5* 8.3* 7.7*  MG 1.3*  --  2.0  --   GFRNONAA  --  >60 60* 57*  ANIONGAP  --  12 11 3*    Lipids No results for input(s): "CHOL", "TRIG", "HDL", "LABVLDL", "LDLCALC", "CHOLHDL" in the last 168 hours.  Hematology Recent Labs  Lab 10/10/21 0038 10/11/21 0125 10/11/21 0604  WBC 8.5 9.4 13.2*  RBC 3.18* 2.34* 3.44*  HGB 9.8* 7.2* 10.6*  HCT 28.5* 23.5* 32.4*  MCV 89.6 100.4* 94.2  MCH 30.8 30.8 30.8  MCHC 34.4 30.6 32.7  RDW 17.3* 18.6* 18.1*  PLT 371 265 371   Thyroid No results for input(s): "TSH", "FREET4" in the last 168 hours.  BNPNo results for input(s): "BNP", "PROBNP" in the last 168 hours.  DDimer No results for input(s): "DDIMER" in the last 168 hours.   Radiology    No results found.  Cardiac Studies   Echo 08/03/21  1. Left ventricular ejection fraction, by estimation, is 40 to 45%. The  left ventricle has mildly decreased function. The left ventricle  demonstrates global hypokinesis. There is moderate concentric left  ventricular hypertrophy. Left ventricular  diastolic parameters are indeterminate. Regional wall thickness 0.72, E/e'  9.   2. Right ventricular systolic function is normal. The right ventricular  size is normal. There is mildly elevated pulmonary artery systolic  pressure. The estimated right ventricular systolic  pressure is 01.7 mmHg.   3. Left atrial size was moderately dilated.   4. The mitral valve is degenerative. Trivial mitral valve regurgitation.  Mitral valve area by continuity equation 1.95 cm2   5. Tricuspid valve regurgitation is moderate.   6. The aortic valve is tricuspid. There is mild calcification of the  aortic valve. There is moderate thickening of the aortic valve. Aortic  valve regurgitation is not visualized. Aortic valve sclerosis is present,  with no evidence of aortic valve  stenosis. Aortic valve mean gradient measures 3.0 mmHg.   Patient Profile     88 y.o. female with PMH chronic systolic and diastolic heart failure, admitted with atrial fibrillation with RVR.  Assessment & Plan    Atrial fibrillation -now with controlled ventricular rate -CHA2DS2/VAS Stroke Risk Points=4  -on apixaban, continue -has been intermittently refusing PO meds. Would try to encourage oral meds as IV not a long term option. Continue diltiazem and metoprolol as ordered  Anemia -Hgb downtrending, 10.5 -> 9.8 -> 7.2. Back to 10.6 today. No clear blood loss noted. Suspect error -Per primary team  Pulmonary emboli -on apixaban as above  [place] will sign off.   Medication Recommendations:  continue metoprolol, diltiazem, and apixaban as ordered Other recommendations (labs, testing, etc):  none Follow up as an outpatient:  We will arrange for outpatient follow up with cardiology.  For questions or updates, please contact [place] Please consult [email or web address] for contact info under        Signed, Buford Dresser, MD  10/11/2021, 4:14 PM

## 2021-10-11 NOTE — Progress Notes (Addendum)
PROGRESS NOTE    Judy [name]  YIF:[id number] DOB: 18-Feb-1930 DOA: 10/08/2021 PCP: Mayra Neer, MD   Brief Narrative: 88 year old with past medical history significant for A-fib, systolic heart failure chronic, bilateral PE/right lower extremity DVT diagnosed 09/2021 she was sent from ALF due to fall, hit her head on the wall while trying to help making the bed at her facility, she developed 1 inch laceration to the right forehead sutured  in the ED, found to be in A-fib RVR and admitted to the hospital.  Hospital course placated by encephalopathy with agitation, and periods of  lethargic.   Assessment & Plan:   Active Problems:   Hypokalemia   Pulmonary emboli (HCC)   Paroxysmal atrial fibrillation with RVR (HCC)   Hypomagnesemia   Dementia without behavioral disturbance (HCC)   FTT (failure to thrive) in adult   1-A-fib with RVR On IV Cardizem.  Refuses oral meds.  Appreciate cardiology assistance.  On Subq Lovenox.   2-Chronic systolic heart failure, ejection fraction 40 to 45% in 07/2021 Concern for dehydration on admission.  Holding diuretics    Urinary retention: Had In and out cath. 8/27. Per nurse report patient urinated this am.  Plan to continue to monitor urine out put.  Patient continue to have urine retention. She gets agitated, I think we need to proceed with foley catheter placement, and not doing in and out because that would just get patient more agitated.   Hypokalemia/hypomagnesemia Replete IV kcl 3 runs.  Mg replaced.   Moderate Right pleural effusion with a history of right-sided breast cancer IR consulted for thoracentesis after discussion with family plan is to hold on thoracentesis at this time.  Bilateral PE/right lower extremity DVT On lovenox.  Transition to eliquis when taking oral.   Anemia: hb down to 7. No evidence of active bleeding. Probably not accurate. Plan to recheck CBC this am.  Failure to thrive,  increased fall, poor  appetite ongoing weight loss -CT abdomen chest, pelvis, CT cervical spine, CT maxillofacial, CT of the head no acute finding -Palliative care consulted.   Forehead laceration status post suturing the ED 8/25 Needs sutures removed in 5 days   Dementia Delirium -Family would like to be contacted before any sedative medications to be given. Palliative care consulted.   Low grade fever;  Check UA   Estimated body mass index is 21.8 kg/m as calculated from the following:   Height as of this encounter: '5\' 4"'$  (1.626 m).   Weight as of this encounter: 57.6 kg.   DVT prophylaxis: Lovenox Code Status: DNR Family Communication: Disposition Plan:  Status is: Inpatient Remains inpatient appropriate because: management of A fib     Consultants:  Cardiology   Procedures:  None  Antimicrobials:    Subjective: She is sleepy, she wants to be cover. Denies pain.  She gets agitated at times.  Refuse meds this am.   Objective: Vitals:   10/11/21 0325 10/11/21 0410 10/11/21 0437 10/11/21 0700  BP: (!) 93/57 110/73  95/77  Pulse: 79 81  87  Resp: '18 18  18  '$ Temp: 97.8 F (36.6 C)   97.8 F (36.6 C)  TempSrc: Axillary   Oral  SpO2: 100% 97%  92%  Weight:   57.6 kg   Height:        Intake/Output Summary (Last 24 hours) at 10/11/2021 0738 Last data filed at 10/10/2021 1847 Gross per 24 hour  Intake 867.25 ml  Output 240 ml  Net 627.25  ml   Filed Weights   10/09/21 0607 10/10/21 0659 10/11/21 0437  Weight: 54.7 kg 58.8 kg 57.6 kg    Examination:  General exam: Appears calm and comfortable  Respiratory system: Clear to auscultation. Respiratory effort normal. Cardiovascular system: S1 & S2 heard, IRR Gastrointestinal system: Abdomen is nondistended, soft and nontender. No organomegaly or masses felt. Normal bowel sounds heard. Central nervous system: sleepy Extremities: Symmetric 5 x 5 power.     Data Reviewed: I have personally reviewed following labs and  imaging studies  CBC: Recent Labs  Lab 10/08/21 0839 10/09/21 0630 10/10/21 0038 10/11/21 0125  WBC 8.0 8.4 8.5 9.4  NEUTROABS  --   --  6.6  --   HGB 10.5* 10.5* 9.8* 7.2*  HCT 32.2* 31.9* 28.5* 23.5*  MCV 93.9 91.4 89.6 100.4*  PLT 434* 416* 371 536   Basic Metabolic Panel: Recent Labs  Lab 10/08/21 0839 10/08/21 2047 10/09/21 0630 10/10/21 0038 10/11/21 0533  NA 136  --  133* 134* 132*  K 2.8* 3.0* 4.3 4.1 3.4*  CL 99  --  101 103 105  CO2 28  --  20* 20* 24  GLUCOSE 109*  --  110* 118* 153*  BUN 11  --  '11 15 18  '$ CREATININE 0.69  --  0.76 0.92 0.96  CALCIUM 8.7*  --  8.5* 8.3* 7.7*  MG  --  1.3*  --  2.0  --   PHOS  --  2.7  --   --   --    GFR: Estimated Creatinine Clearance: 34.3 mL/min (by C-G formula based on SCr of 0.96 mg/dL). Liver Function Tests: No results for input(s): "AST", "ALT", "ALKPHOS", "BILITOT", "PROT", "ALBUMIN" in the last 168 hours. No results for input(s): "LIPASE", "AMYLASE" in the last 168 hours. No results for input(s): "AMMONIA" in the last 168 hours. Coagulation Profile: Recent Labs  Lab 10/08/21 2047  INR 1.9*   Cardiac Enzymes: No results for input(s): "CKTOTAL", "CKMB", "CKMBINDEX", "TROPONINI" in the last 168 hours. BNP (last 3 results) No results for input(s): "PROBNP" in the last 8760 hours. HbA1C: No results for input(s): "HGBA1C" in the last 72 hours. CBG: Recent Labs  Lab 10/09/21 1145 10/11/21 0222  GLUCAP 111* 144*   Lipid Profile: No results for input(s): "CHOL", "HDL", "LDLCALC", "TRIG", "CHOLHDL", "LDLDIRECT" in the last 72 hours. Thyroid Function Tests: No results for input(s): "TSH", "T4TOTAL", "FREET4", "T3FREE", "THYROIDAB" in the last 72 hours. Anemia Panel: Recent Labs    10/10/21 0038  VITAMINB12 1,063*  TIBC 175*  IRON 27*   Sepsis Labs: No results for input(s): "PROCALCITON", "LATICACIDVEN" in the last 168 hours.  Recent Results (from the past 240 hour(s))  MRSA Next Gen by PCR, Nasal      Status: None   Collection Time: 10/08/21 10:36 PM   Specimen: Nasal Mucosa; Nasal Swab  Result Value Ref Range Status   MRSA by PCR Next Gen NOT DETECTED NOT DETECTED Final    Comment: (NOTE) The GeneXpert MRSA Assay (FDA approved for NASAL specimens only), is one component of a comprehensive MRSA colonization surveillance program. It is not intended to diagnose MRSA infection nor to guide or monitor treatment for MRSA infections. Test performance is not FDA approved in patients less than 57 years old. Performed at [facility], [place] [address], [place], [place] [address]          Radiology Studies: No results found.      Scheduled Meds:  acetaminophen  1,000 mg Oral BID   acidophilus  1 capsule Oral Daily   atorvastatin  10 mg Oral QHS   diltiazem  240 mg Oral Daily   enoxaparin (LOVENOX) injection  60 mg Subcutaneous Q12H   feeding supplement  237 mL Oral BID BM   metoprolol tartrate  100 mg Oral BID   polyethylene glycol  17 g Oral Daily   QUEtiapine  25 mg Oral QHS   senna-docusate  1 tablet Oral BID   sodium chloride flush  3 mL Intravenous Q12H   Continuous Infusions:  sodium chloride     dextrose 5% lactated ringers 75 mL/hr at 10/11/21 0644   diltiazem (CARDIZEM) infusion 5 mg/hr (10/11/21 0326)     LOS: 3 days    Time spent: 35 minutes    Saori Umholtz A Dajahnae Vondra, MD [facility]   If 7PM-7AM, please contact night-coverage [email or web address]  10/11/2021, 7:38 AM

## 2021-10-11 NOTE — Consult Note (Signed)
Palliative Care Consult Note                                  Date: 10/11/2021   Patient Name: Judy [name]  DOB: [birth year]  MRN: [id number]  Age / Sex: 88 y.o., female  PCP: Judy Neer, MD Referring Physician: Elmarie Shiley, MD  Reason for Consultation: Establishing goals of care  HPI/Patient Profile: 88 y.o. female  with past medical history of dementia, atrial fibrillation, chronic systolic CHF, bilateral PE/RLE DVT (diagnosed 09/2021).  She presented to [name] from her ALF on 10/08/2021 after a fall.  She hit her head on the wall while trying to help make her bed at the facility and sustained I wanted to laceration to right forehead.  In the ED, she was found to be in A-fib with RVR.  Admitted to [name] service with cardiology consult.  Palliative Medicine was consulted for goals of care in the setting of failure to thrive.  Past Medical History:  Diagnosis Date   Blood transfusion    Breast cancer, left ([name]) 09/26/2011   Punch biopsies of the left nipple done on 09/16/2011.  ER and PR were negative.  HER2 is positive with ratio 4.68.Had mastectomy on 10/25/11. Path T1,N0.    Chronic low back pain    Dyslipidemia    GERD (gastroesophageal reflux disease)    HTN (hypertension)    Osteoarthritis    Osteoporosis     Subjective:   I have reviewed medical records including EPIC notes, labs and imaging, received updates from RN, and assessed the patient at bedside. She is confused and her speech is difficult to understand. Per her RN, she is refusing all medications.   She remains on a cardizem drip. Per cardiology note yesterday 8/27, plan is to switch to po cardizem today. Patient did take the po cardizem when family arrived to bedside. Cardizem drip was turned off.   I met with patient's nephew/Judy [name] and sister/Judy [name] (in person) to discuss diagnosis, prognosis, GOC, EOL wishes, disposition, and options. Granddaughter/Judy [name] and  daughter [name]) were on speaker phone.   I introduced Palliative Medicine as specialized medical care for people living with serious illness. It focuses on providing relief from the symptoms and stress of a serious illness.   Family shares that Judy [name] has had a difficult time this year. Her son passed away in 2022/04/01 followed by her husband in April. She moved into [name] about 2 months ago.    We discussed patient's current illness and what it means in the larger context of her ongoing co-morbidities. Reviewed that Judy [name] has multiple chronic and acute medical problems including heart failure, a-fib, bilateral PE and RLE DVT, and right pleural effusion with history of right breast cancer.    Current clinical status was reviewed. Discussed that Judy [name] has been failing to thrive with increased falls, poor appetite, and ongoing weight loss over the past several months.   We also reviewed that dementia is a progressive, non-curable disease underlying the patient's current acute medical conditions. Discussed that her hospitalization has been complicated by agitation, which is common for patients with dementia when they are out of their familiar environment.   Created space and opportunity for family to explore thoughts and feelings regarding current medical situation. Family feels very strongly that the [name] environment is not good for Judy [name] as it causes her to be agitated. Their goal is to get  her back to [name] as soon as she is medically optimized. They greatly want to avoid rehospitalization.   The difference between full scope medical intervention and comfort care was considered.  I introduced the concept of a comfort path to family, which means de-escalating full scope interventions while allowing a natural course to occur. Discussed that the goal is comfort and dignity rather than cure/prolonging life. Family indicates they agree with comfort-focused care for Judy [name].   Provided  education and counseling on the philosophy and benefits of hospice care.  Discussed the hospice team can provide personal care, support for the family, and help keep patient out of the [name]. Family agrees that hospice would support their goal to avoid rehospitalization.   Questions and concerns addressed. Family encouraged to call with questions or concerns.    Review of Systems  Unable to perform ROS: Dementia    Objective:   Primary Diagnoses: Present on Admission:  Paroxysmal atrial fibrillation with RVR ([place])  Pulmonary emboli (HCC)  Hypokalemia   Physical Exam Vitals reviewed.  Constitutional:      General: She is not in acute distress.    Comments: Frail and chronically ill-appearing  Cardiovascular:     Rate and Rhythm: Normal rate. Rhythm irregularly irregular.  Pulmonary:     Effort: Pulmonary effort is normal.  Neurological:     Mental Status: She is alert. She is confused.  Psychiatric:        Cognition and Memory: Cognition is impaired.     Vital Signs:  BP 112/68 (BP Location: Right Arm)   Pulse 90   Temp 97.8 F (36.6 C) (Oral)   Resp 15   Ht '5\' 4"'  (1.626 m)   Wt 57.6 kg   SpO2 100%   BMI 21.80 kg/m   Palliative Assessment/Data: PPS 20-30%     Assessment & Plan:   SUMMARY OF RECOMMENDATIONS   DNR/DNI as previously documented Continue current supportive care Plan for return to [name] with hospice when medically optimized Family's main goal is to avoid rehospitalization PMT will continue to follow  Primary Decision Maker: No  HCPOA on file; family makes decisions together  Prognosis:  < 6 months  Discharge Planning:  [name] with hospice    Thank you for allowing [name] to participate in the care of [name]  Signed by: Elie Confer, NP Palliative Medicine Team  Team Phone # [phone]  For individual providers, please see AMION

## 2021-10-11 NOTE — Progress Notes (Signed)
ANTICOAGULATION CONSULT NOTE   Pharmacy Consult for enoxaparin Indication: atrial fibrillation & PE/DVT  Allergies  Allergen Reactions   Amoxil [Amoxicillin] Nausea Only   Codeine Nausea Only   Aricept [Donepezil Hcl] Other (See Comments)    "Felt drunk" Headaches Drowsiness   Catapres [Clonidine] Other (See Comments)    Dry mouth   Hycodan [Hydrocodone Bit-Homatrop Mbr] Diarrhea and Nausea And Vomiting   Norco [Hydrocodone-Acetaminophen] Diarrhea and Nausea And Vomiting   Norvasc [Amlodipine] Swelling   Prolia [Denosumab] Other (See Comments)    Unknown reaction   Ultram [Tramadol] Nausea Only   Zoloft [Sertraline] Nausea Only   Clavulanic Acid Hives and Rash    Patient Measurements: Height: '5\' 4"'  (162.6 cm) Weight: 57.6 kg (126 lb 15.8 oz) IBW/kg (Calculated) : 54.7 Heparin Dosing Weight: 55.6 kg  Vital Signs: Temp: 97.8 F (36.6 C) (08/28 0700) Temp Source: Oral (08/28 0700) BP: 112/68 (08/28 0700) Pulse Rate: 90 (08/28 0700)  Labs: Recent Labs    10/08/21 2047 10/09/21 0630 10/09/21 2149 10/10/21 0038 10/11/21 0125 10/11/21 0533  HGB  --  10.5*  --  9.8* 7.2*  --   HCT  --  31.9*  --  28.5* 23.5*  --   PLT  --  416*  --  371 265  --   APTT 72* 125* 65* 70*  --   --   LABPROT 21.9*  --   --   --   --   --   INR 1.9*  --   --   --   --   --   HEPARINUNFRC >1.10* >1.10*  --  >1.10*  --   --   CREATININE  --  0.76  --  0.92  --  0.96     Estimated Creatinine Clearance: 34.3 mL/min (by C-G formula based on SCr of 0.96 mg/dL).   Medical History: Past Medical History:  Diagnosis Date   Blood transfusion    Breast cancer, left ([place]) 09/26/2011   Punch biopsies of the left nipple done on 09/16/2011.  ER and PR were negative.  HER2 is positive with ratio 4.68.Had mastectomy on 10/25/11. Path T1,N0.    Chronic low back pain    Dyslipidemia    GERD (gastroesophageal reflux disease)    HTN (hypertension)    Osteoarthritis    Osteoporosis        Scheduled:   acetaminophen  1,000 mg Oral BID   acidophilus  1 capsule Oral Daily   atorvastatin  10 mg Oral QHS   diltiazem  240 mg Oral Daily   enoxaparin (LOVENOX) injection  60 mg Subcutaneous Q12H   feeding supplement  237 mL Oral BID BM   metoprolol tartrate  100 mg Oral BID   polyethylene glycol  17 g Oral Daily   potassium chloride  40 mEq Oral Once   QUEtiapine  25 mg Oral QHS   senna-docusate  1 tablet Oral BID   sodium chloride flush  3 mL Intravenous Q12H   Infusions:   sodium chloride     dextrose 5% lactated ringers 75 mL/hr at 10/11/21 0644   diltiazem (CARDIZEM) infusion 5 mg/hr (10/11/21 0326)   PRN: sodium chloride, acetaminophen, enalaprilat, haloperidol lactate, HYDROmorphone (DILAUDID) injection, ondansetron (ZOFRAN) IV, sodium chloride flush  Assessment: 22 yoF admitted with AF. Pt also has hx VTE on apixaban prior to admit. Pt was started on IV heparin with possible need for thoracentesis, then transitioned back to apixaban. Pt unable to tolerate PO medications at  this time though due to extreme agitation. Pharmacy asked to dose enoxaparin.   CBC this morning shows hgb 7.2 (down from 9.8) and Plts stable at 265. No reports of bleeding per chart review.  Goal of Therapy:  Enoxaparin level 0.6-1 Monitor platelets by anticoagulation protocol: Yes   Plan:  Continue enoxaparin 23m Q12H (1 mg/kg Q12H) Can get Xa level starting 8/29 - will defer for now until oral medication plan is established.  LErskine Speed PharmD Please check AMION for all [facility] 10/11/2021

## 2021-10-11 NOTE — TOC Progression Note (Signed)
Transition of Care [facility]) - Progression Note    Patient Details  Name: Judy [name] MRN: [id number] Date of Birth: 01/20/1931  [facility]) CM/SW Contact  Reece Agar, [place] Phone Number: 10/11/2021, 4:22 PM  Clinical Narrative:    CSW contacted [name] at [facility] to update about possible DC tomorrow. No answer CSW will continue to follow. Pt does not need auth, pt has approval.         Expected Discharge Plan and Services                                                 Social Determinants of Health (SDOH) Interventions    Readmission Risk Interventions     No data to display

## 2021-10-11 NOTE — Plan of Care (Signed)
°  Problem: Clinical Measurements: °Goal: Respiratory complications will improve °Outcome: Progressing °Goal: Cardiovascular complication will be avoided °Outcome: Progressing °  °Problem: Coping: °Goal: Level of anxiety will decrease °Outcome: Progressing °  °Problem: Pain Managment: °Goal: General experience of comfort will improve °Outcome: Progressing °  °Problem: Safety: °Goal: Ability to remain free from injury will improve °Outcome: Progressing °  °

## 2021-10-11 NOTE — Progress Notes (Addendum)
Paged MD Regalado about bladder scan amount of 369. Per retention protocol patient to receive 3 in and outs before foley insertion. Orders to put in a foley catheter due to one previous In and Out cath. Called daughter Shaima Sardinas and she was okay with her mother getting a foley catheter.

## 2021-10-11 NOTE — Progress Notes (Addendum)
Paged NP Maylon Peppers at 0930 due to cardizem gtt still being on 73m and orders to give PO cardizem at 1000 while BP is soft in the 990'Xsystolic and heart rate is in the 80's. Orders to turn of IV cardizem and give PO cardizem as well as hold metoprolol due to soft pressures. At 1025 patient refused all PO meds, NP made aware. Orders to leave her IV cardizem on.

## 2021-10-11 NOTE — Care Management Important Message (Signed)
Important Message  Patient Details  Name: Judy [name] MRN: [id number] Date of Birth: 1930-02-16   Medicare Important Message Given:  Yes     [name] 10/11/2021, 1:37 PM

## 2021-10-12 ENCOUNTER — Other Ambulatory Visit (HOSPITAL_COMMUNITY): Payer: Self-pay

## 2021-10-12 LAB — CBC
HCT: 30.4 % — ABNORMAL LOW (ref 36.0–46.0)
Hemoglobin: 10 g/dL — ABNORMAL LOW (ref 12.0–15.0)
MCH: 30.6 pg (ref 26.0–34.0)
MCHC: 32.9 g/dL (ref 30.0–36.0)
MCV: 93 fL (ref 80.0–100.0)
Platelets: 344 10*3/uL (ref 150–400)
RBC: 3.27 MIL/uL — ABNORMAL LOW (ref 3.87–5.11)
RDW: 18 % — ABNORMAL HIGH (ref 11.5–15.5)
WBC: 10.1 10*3/uL (ref 4.0–10.5)
nRBC: 0 % (ref 0.0–0.2)

## 2021-10-12 LAB — BASIC METABOLIC PANEL
Anion gap: 7 (ref 5–15)
BUN: 19 mg/dL (ref 8–23)
CO2: 23 mmol/L (ref 22–32)
Calcium: 8.4 mg/dL — ABNORMAL LOW (ref 8.9–10.3)
Chloride: 105 mmol/L (ref 98–111)
Creatinine, Ser: 0.82 mg/dL (ref 0.44–1.00)
GFR, Estimated: >60 mL/min (ref >60)
Glucose, Bld: 146 mg/dL — ABNORMAL HIGH (ref 70–99)
Potassium: 4.4 mmol/L (ref 3.5–5.1)
Sodium: 135 mmol/L (ref 135–145)

## 2021-10-12 MED ORDER — QUETIAPINE FUMARATE 25 MG PO TABS
25.0000 mg | ORAL_TABLET | Freq: Every day | ORAL | 0 refills | Status: DC
  Filled 2021-10-12: qty 30, 30d supply, fill #0

## 2021-10-12 MED ORDER — CEPHALEXIN 500 MG PO CAPS
500.0000 mg | ORAL_CAPSULE | Freq: Three times a day (TID) | ORAL | 0 refills | Status: AC
  Filled 2021-10-12: qty 15, 5d supply, fill #0

## 2021-10-12 MED ORDER — POLYETHYLENE GLYCOL 3350 17 GM/SCOOP PO POWD
17.0000 g | Freq: Every day | ORAL | 0 refills | Status: AC
  Filled 2021-10-12: qty 238, 14d supply, fill #0

## 2021-10-12 MED ORDER — DILTIAZEM HCL ER COATED BEADS 240 MG PO CP24
240.0000 mg | ORAL_CAPSULE | Freq: Every day | ORAL | 0 refills | Status: DC
  Filled 2021-10-12: qty 30, 30d supply, fill #0

## 2021-10-12 MED ORDER — ORAL CARE MOUTH RINSE: 15.0000 mL | OROMUCOSAL | Status: DC | PRN

## 2021-10-12 MED ORDER — METOPROLOL TARTRATE 100 MG PO TABS
100.0000 mg | ORAL_TABLET | Freq: Two times a day (BID) | ORAL | 0 refills | Status: DC
  Filled 2021-10-12: qty 60, 30d supply, fill #0

## 2021-10-12 MED ORDER — SODIUM CHLORIDE 0.9 % IV SOLN
2.0000 g | Freq: Every day | INTRAVENOUS | Status: DC
  Administered 2021-10-12: 2 g via INTRAVENOUS
  Filled 2021-10-12: qty 20

## 2021-10-12 NOTE — TOC CAGE-AID Note (Signed)
Transition of Care [facility]) - CAGE-AID Screening   Patient Details  Name: Judy [name] MRN: [id number] Date of Birth: 09-07-1930  [facility] CM/SW Contact:    Army Melia, RN Phone Number:[phone] 10/12/2021, 4:53 AM   Clinical Narrative:  No drug or alcohol use, no resources indicated.  CAGE-AID Screening:    Have You Ever Felt You Ought to Cut Down on Your Drinking or Drug Use?: No Have People Annoyed You By Critizing Your Drinking Or Drug Use?: No Have You Felt Bad Or Guilty About Your Drinking Or Drug Use?: No Have You Ever Had a Drink or Used Drugs First Thing In The Morning to Steady Your Nerves or to Get Rid of a Hangover?: No CAGE-AID Score: 0  Substance Abuse Education Offered: No

## 2021-10-12 NOTE — Discharge Summary (Addendum)
Physician Discharge Summary   Patient: Judy [name] MRN: [id number] DOB: Apr 27, 1930  Admit date:     10/08/2021  Discharge date: 10/12/21  Discharge Physician: Elmarie Shiley   PCP: Mayra Neer, MD   Recommendations at discharge:    Needs voiding trial.  Follow urine culture.  Needs follow up by Hospice at facility.  Need to have suture remove 8/30. Needs voiding trial.   Discharge Diagnoses: Active Problems:   Hypokalemia   Pulmonary emboli (HCC)   Paroxysmal atrial fibrillation with RVR (HCC)   Hypomagnesemia   Dementia without behavioral disturbance (HCC)   FTT (failure to thrive) in adult  Resolved Problems:   * No resolved hospital problems. [facility] Course: 88 year old with past medical history significant for A-fib, systolic heart failure chronic, bilateral PE/right lower extremity DVT diagnosed 09/2021 she was sent from ALF due to fall, hit her head on the wall while trying to help making the bed at her facility, she developed 1 inch laceration to the right forehead sutured  in the ED, found to be in A-fib RVR and admitted to the hospital.   Hospital course placated by encephalopathy with agitation, and periods of  lethargic. She is more alert today and calm, after foley catheter place. She is willing to take medication.  She will received one dose IV ceftriaxone prior to discharge, she will be discharge on Keflex for 5 days.   Assessment and Plan: 1-A-fib with RVR Received  Subq Lovenox.  Continue with cardizem and metoprolol.  On eliquis.   2-Chronic systolic heart failure, ejection fraction 40 to 45% in 07/2021 Concern for dehydration on admission.  Holding diuretics  Patient has chronic systolic., no evidence of acute exacerbation.    Urinary retention: Had In and out cath. 8/27. Patient continue to have urine retention. She gets agitated, I think we need to proceed with foley catheter placement. Foley catheter placed.      Hypokalemia/hypomagnesemia Replete IV kcl 3 runs.  Mg replaced.    Moderate Right pleural effusion with a history of right-sided breast cancer IR consulted for thoracentesis after discussion with family plan is to hold on thoracentesis at this time.   Bilateral PE/right lower extremity DVT On lovenox.  Transition to eliquis    Anemia: hb down to 7. No evidence of active bleeding. Probably not accurate.hb stable.    Failure to thrive,  increased fall, poor appetite ongoing weight loss -CT abdomen chest, pelvis, CT cervical spine, CT maxillofacial, CT of the head no acute finding -Palliative care consulted.    Forehead laceration status post suturing the ED 8/25 Needs sutures removed in 5 days    Acute metabolic encephalopathy  Dementia Delirium -Family would like to be contacted before any sedative medications to be given. Palliative care consulted. plan to discharge to facility when medical stable with hospice care.    UTI: not related to foley catheter.  Low grade fever;   UA; 11-20 WBC She will received a dose IV ceftriaxone. Discharge on Keflex for 5 days.            Pain control - [employer] Controlled Substance Reporting System database was reviewed. and patient was instructed, not to drive, operate heavy machinery, perform activities at heights, swimming or participation in water activities or provide baby-sitting services while on Pain, Sleep and Anxiety Medications; until their outpatient Physician has advised to do so again. Also recommended to not to take more than prescribed Pain, Sleep and Anxiety Medications.  Consultants:  Palliative Procedures performed: None Disposition: Skilled nursing facility Diet recommendation:  Discharge Diet Orders (From admission, onward)     Start     Ordered   10/12/21 0000  Diet - low sodium heart healthy        10/12/21 0902           Cardiac diet DISCHARGE MEDICATION: Allergies as of 10/12/2021       Reactions    Amoxil [amoxicillin] Nausea Only   Codeine Nausea Only   Aricept [donepezil Hcl] Other (See Comments)   "Felt drunk" Headaches Drowsiness   Catapres [clonidine] Other (See Comments)   Dry mouth   Hycodan [hydrocodone Bit-homatrop Mbr] Diarrhea, Nausea And Vomiting   Norco [hydrocodone-acetaminophen] Diarrhea, Nausea And Vomiting   Norvasc [amlodipine] Swelling   Prolia [denosumab] Other (See Comments)   Unknown reaction   Ultram [tramadol] Nausea Only   Zoloft [sertraline] Nausea Only   Clavulanic Acid Hives, Rash        Medication List     STOP taking these medications    furosemide 20 MG tablet Commonly known as: LASIX   hydrochlorothiazide 25 MG tablet Commonly known as: HYDRODIURIL   ibuprofen 200 MG tablet Commonly known as: ADVIL   lisinopril 10 MG tablet Commonly known as: ZESTRIL   metoprolol succinate 50 MG 24 hr tablet Commonly known as: TOPROL-XL   potassium chloride SA 20 MEQ tablet Commonly known as: KLOR-CON M   traMADol 50 MG tablet Commonly known as: ULTRAM       TAKE these medications    acetaminophen 500 MG tablet Commonly known as: TYLENOL Take 1,000 mg by mouth every 12 (twelve) hours. Taking every 12 hours per MAR   aluminum-magnesium hydroxide-simethicone [id number] MG/5ML Susp Commonly known as: MAALOX Take 30 mLs by mouth daily as needed (for constipation).   atorvastatin 10 MG tablet Commonly known as: LIPITOR Take 1 tablet (10 mg total) by mouth daily at 6 PM. What changed: when to take this   cephALEXin 500 MG capsule Commonly known as: KEFLEX Take 1 capsule (500 mg total) by mouth 3 (three) times daily for 5 days.   diltiazem 240 MG 24 hr capsule Commonly known as: CARDIZEM CD Take 1 capsule (240 mg total) by mouth daily. What changed:  medication strength how much to take   Eliquis 5 MG Tabs tablet Generic drug: apixaban Take 5 mg by mouth 2 (two) times daily. What changed: Another medication with the same  name was removed. Continue taking this medication, and follow the directions you see here.   guaifenesin 100 MG/5ML syrup Commonly known as: ROBITUSSIN Take 200 mg by mouth every 6 (six) hours as needed for cough.   loperamide 2 MG capsule Commonly known as: IMODIUM Take 2 mg by mouth daily as needed for diarrhea or loose stools.   metoprolol tartrate 100 MG tablet Commonly known as: LOPRESSOR Take 1 tablet (100 mg total) by mouth 2 (two) times daily.   neomycin-bacitracin-polymyxin [phone] ointment Apply 1 Application topically daily as needed (For minor skin tear).   OMEGA-3 PO Take 1 capsule by mouth daily.   polyethylene glycol powder 17 GM/SCOOP powder Commonly known as: GLYCOLAX/MIRALAX Take 1 capful (17 g) with water by mouth daily.   QUEtiapine 25 MG tablet Commonly known as: SEROQUEL Take 1 tablet (25 mg total) by mouth at bedtime.   saccharomyces boulardii 250 MG capsule Commonly known as: FLORASTOR Take 250 mg by mouth daily.  Discharge Care Instructions  (From admission, onward)           Start     Ordered   10/12/21 0000  Discharge wound care:       Comments: See above   10/12/21 0902            Follow-up Information     Deberah Pelton, NP. Go on 10/25/2021.   Specialty: Cardiology Why: Appt with Coletta Memos, NP on 10/25/21 at 11 AM at the [facility] office Contact information: [address] [place] [address] [phone]                Discharge Exam: Danley Danker Weights   10/10/21 0659 10/11/21 0437 10/12/21 0600  Weight: 58.8 kg 57.6 kg 56.5 kg   General; NAD Condition at discharge: stable  The results of significant diagnostics from this hospitalization (including imaging, microbiology, ancillary and laboratory) are listed below for reference.   Imaging Studies: CT CHEST ABDOMEN PELVIS W CONTRAST  Result Date: 10/08/2021 CLINICAL DATA:  Blunt polytrauma EXAM: CT CHEST,  ABDOMEN, AND PELVIS WITH CONTRAST TECHNIQUE: Multidetector CT imaging of the chest, abdomen and pelvis was performed following the standard protocol during bolus administration of intravenous contrast. RADIATION DOSE REDUCTION: This exam was performed according to the departmental dose-optimization program which includes automated exposure control, adjustment of the mA and/or kV according to patient size and/or use of iterative reconstruction technique. CONTRAST:  29m OMNIPAQUE IOHEXOL 300 MG/ML SOLN IV. No oral contrast. COMPARISON:  CT angio chest 09/20/2021, CT abdomen and pelvis 10/04/2006 FINDINGS: CT CHEST FINDINGS Cardiovascular: Aorta normal caliber. Scattered atherosclerotic calcifications aorta in coronary arteries. Enlargement of cardiac chambers greatest at RIGHT atrium. No pericardial effusion. Pulmonary arteries adequately opacified. Large filling defect again identified within RIGHT pulmonary artery consistent with pulmonary embolism. Occlusive thrombus extends into RIGHT middle and RIGHT lower lobe pulmonary arteries. Smaller pulmonary emboli are seen in BILATERAL upper and LEFT lower lobe pulmonary arteries. These were all evident on the prior study as well. Mediastinum/Nodes: Esophagus unremarkable. Base of cervical region normal appearance. No thoracic adenopathy. Lungs/Pleura: Moderate RIGHT pleural effusion. Subsegmental atelectasis RIGHT lower and RIGHT middle lobes. Subpleural interstitial changes LEFT lung again seen. No new infiltrate or pneumothorax. Musculoskeletal: Diffuse osseous demineralization. LEFT breast prosthesis. CT ABDOMEN PELVIS FINDINGS Hepatobiliary: Gallbladder and liver normal appearance Pancreas: Few scattered parenchymal calcifications. Atrophic. No definite mass. Spleen: Normal appearance Adrenals/Urinary Tract: Nonobstructing LEFT renal calculus. Adrenal glands, kidneys, ureters, and bladder otherwise normal appearance. Stomach/Bowel: Appendix not visualized. Scattered  stool throughout colon. Uncomplicated sigmoid diverticula. Stomach and bowel loops otherwise unremarkable. Vascular/Lymphatic: Atherosclerotic calcifications aorta and iliac arteries without aneurysm. No adenopathy. Reproductive: Uterus surgically absent with nonvisualization of ovaries Other: No free air or free fluid.  No hernia. Musculoskeletal: Osseous demineralization with postsurgical changes of posterior L5-S1 fusion. IMPRESSION: BILATERAL pulmonary emboli as above with much larger clot burden on RIGHT, unchanged since 09/20/2021, with persistent enlargement of cardiac chambers particularly RIGHT atrium. Moderate RIGHT pleural effusion with subsegmental atelectasis at RIGHT lung base. No acute intra-abdominal or intrapelvic abnormalities. Nonobstructing LEFT renal calculus. Uncomplicated sigmoid diverticulosis. Aortic Atherosclerosis (ICD10-I70.0). Findings discussed with Dr. PAlvino Chapelon 10/08/2021 at 1249 hours. Electronically Signed   By: MLavonia DanaM.D.   On: 10/08/2021 12:49   CT HEAD WO CONTRAST (5MM)  Result Date: 10/08/2021 CLINICAL DATA:  Head trauma, minor (Age >= 65y); Facial trauma, blunt; Neck trauma (Age >= 65y) EXAM: CT HEAD WITHOUT CONTRAST CT MAXILLOFACIAL WITHOUT CONTRAST CT  CERVICAL SPINE WITHOUT CONTRAST TECHNIQUE: Multidetector CT imaging of the head, cervical spine, and maxillofacial structures were performed using the standard protocol without intravenous contrast. Multiplanar CT image reconstructions of the cervical spine and maxillofacial structures were also generated. RADIATION DOSE REDUCTION: This exam was performed according to the departmental dose-optimization program which includes automated exposure control, adjustment of the mA and/or kV according to patient size and/or use of iterative reconstruction technique. COMPARISON:  None Available. FINDINGS: CT HEAD FINDINGS Brain: No evidence of acute infarction, hemorrhage, hydrocephalus, extra-axial collection or mass  lesion/mass effect. Patchy white matter hypoattenuation, nonspecific but compatible with chronic microvascular ischemic disease. Cerebral atrophy. Vascular: No hyperdense vessel identified. Calcific intracranial atherosclerosis. Skull: No acute fracture. Other: No mastoid effusions. CT MAXILLOFACIAL FINDINGS Osseous: No fracture or mandibular dislocation. No destructive process. Orbits: Negative. No traumatic or inflammatory finding. Sinuses: Clear sinuses. Soft tissues: Negative. CT CERVICAL SPINE FINDINGS Alignment: Unchanged alignment with similar anterolisthesis of C4 on C5 and C7 on T1. No new sagittal subluxation. Skull base and vertebrae: Vertebral body heights are maintained. No evidence of acute fracture. Soft tissues and spinal canal: No prevertebral fluid or swelling. No visible canal hematoma. Disc levels: Similar multilevel degenerative disc disease, moderate to severe. Similar multilevel facet and uncovertebral hypertrophy with varying degrees of neural foraminal stenosis. Upper chest: Partially imaged right pleural effusion. IMPRESSION: 1. No evidence of acute intracranial abnormality or facial fracture. 2. No evidence of acute fracture or traumatic malalignment in the cervical spine. 3. Partially imaged right pleural effusion. Electronically Signed   By: Margaretha Sheffield M.D.   On: 10/08/2021 09:07   CT Maxillofacial Wo Contrast  Result Date: 10/08/2021 CLINICAL DATA:  Head trauma, minor (Age >= 65y); Facial trauma, blunt; Neck trauma (Age >= 65y) EXAM: CT HEAD WITHOUT CONTRAST CT MAXILLOFACIAL WITHOUT CONTRAST CT CERVICAL SPINE WITHOUT CONTRAST TECHNIQUE: Multidetector CT imaging of the head, cervical spine, and maxillofacial structures were performed using the standard protocol without intravenous contrast. Multiplanar CT image reconstructions of the cervical spine and maxillofacial structures were also generated. RADIATION DOSE REDUCTION: This exam was performed according to the departmental  dose-optimization program which includes automated exposure control, adjustment of the mA and/or kV according to patient size and/or use of iterative reconstruction technique. COMPARISON:  None Available. FINDINGS: CT HEAD FINDINGS Brain: No evidence of acute infarction, hemorrhage, hydrocephalus, extra-axial collection or mass lesion/mass effect. Patchy white matter hypoattenuation, nonspecific but compatible with chronic microvascular ischemic disease. Cerebral atrophy. Vascular: No hyperdense vessel identified. Calcific intracranial atherosclerosis. Skull: No acute fracture. Other: No mastoid effusions. CT MAXILLOFACIAL FINDINGS Osseous: No fracture or mandibular dislocation. No destructive process. Orbits: Negative. No traumatic or inflammatory finding. Sinuses: Clear sinuses. Soft tissues: Negative. CT CERVICAL SPINE FINDINGS Alignment: Unchanged alignment with similar anterolisthesis of C4 on C5 and C7 on T1. No new sagittal subluxation. Skull base and vertebrae: Vertebral body heights are maintained. No evidence of acute fracture. Soft tissues and spinal canal: No prevertebral fluid or swelling. No visible canal hematoma. Disc levels: Similar multilevel degenerative disc disease, moderate to severe. Similar multilevel facet and uncovertebral hypertrophy with varying degrees of neural foraminal stenosis. Upper chest: Partially imaged right pleural effusion. IMPRESSION: 1. No evidence of acute intracranial abnormality or facial fracture. 2. No evidence of acute fracture or traumatic malalignment in the cervical spine. 3. Partially imaged right pleural effusion. Electronically Signed   By: Margaretha Sheffield M.D.   On: 10/08/2021 09:07   CT Cervical Spine Wo Contrast  Result Date: 10/08/2021 CLINICAL DATA:  Head trauma, minor (Age >= 65y); Facial trauma, blunt; Neck trauma (Age >= 65y) EXAM: CT HEAD WITHOUT CONTRAST CT MAXILLOFACIAL WITHOUT CONTRAST CT CERVICAL SPINE WITHOUT CONTRAST TECHNIQUE: Multidetector  CT imaging of the head, cervical spine, and maxillofacial structures were performed using the standard protocol without intravenous contrast. Multiplanar CT image reconstructions of the cervical spine and maxillofacial structures were also generated. RADIATION DOSE REDUCTION: This exam was performed according to the departmental dose-optimization program which includes automated exposure control, adjustment of the mA and/or kV according to patient size and/or use of iterative reconstruction technique. COMPARISON:  None Available. FINDINGS: CT HEAD FINDINGS Brain: No evidence of acute infarction, hemorrhage, hydrocephalus, extra-axial collection or mass lesion/mass effect. Patchy white matter hypoattenuation, nonspecific but compatible with chronic microvascular ischemic disease. Cerebral atrophy. Vascular: No hyperdense vessel identified. Calcific intracranial atherosclerosis. Skull: No acute fracture. Other: No mastoid effusions. CT MAXILLOFACIAL FINDINGS Osseous: No fracture or mandibular dislocation. No destructive process. Orbits: Negative. No traumatic or inflammatory finding. Sinuses: Clear sinuses. Soft tissues: Negative. CT CERVICAL SPINE FINDINGS Alignment: Unchanged alignment with similar anterolisthesis of C4 on C5 and C7 on T1. No new sagittal subluxation. Skull base and vertebrae: Vertebral body heights are maintained. No evidence of acute fracture. Soft tissues and spinal canal: No prevertebral fluid or swelling. No visible canal hematoma. Disc levels: Similar multilevel degenerative disc disease, moderate to severe. Similar multilevel facet and uncovertebral hypertrophy with varying degrees of neural foraminal stenosis. Upper chest: Partially imaged right pleural effusion. IMPRESSION: 1. No evidence of acute intracranial abnormality or facial fracture. 2. No evidence of acute fracture or traumatic malalignment in the cervical spine. 3. Partially imaged right pleural effusion. Electronically Signed    By: Margaretha Sheffield M.D.   On: 10/08/2021 09:07   DG Pelvis Portable  Result Date: 10/08/2021 CLINICAL DATA:  Fall EXAM: PORTABLE PELVIS 1-2 VIEWS COMPARISON:  Portable exam 0833 hours compared to 08/13/2021 FINDINGS: Osseous demineralization. BILATERAL hip joint narrowing and spur formation. SI joints preserved. Prior L5-S1 fusion. No acute fracture, dislocation, or bone destruction. Atherosclerotic calcifications at the femoral arteries bilaterally. Significant lateral soft tissue swelling at the level of the LEFT hip and proximal LEFT femur. IMPRESSION: No acute osseous abnormalities. Degenerative changes of BILATERAL hip joints. Electronically Signed   By: Lavonia Dana M.D.   On: 10/08/2021 08:50   DG Chest Portable 1 View  Result Date: 10/08/2021 CLINICAL DATA:  Fall EXAM: PORTABLE CHEST 1 VIEW COMPARISON:  Portable exam 0832 hours compared to 09/20/2021 FINDINGS: Enlargement of cardiac silhouette. Atherosclerotic calcification aorta. Mediastinal contours and pulmonary vascularity otherwise normal. Small RIGHT pleural effusion with RIGHT basilar atelectasis, minimally increased from previous exam. No acute infiltrate or pneumothorax. Diffuse osseous demineralization with question chronic LEFT rotator cuff tear. Degenerative changes at RIGHT sternoclavicular joint. Degenerative disc disease changes and biconvex scoliosis thoracolumbar spine. No definite acute fractures. IMPRESSION: Slightly increased RIGHT pleural effusion and basilar atelectasis versus prior exam. Enlargement of cardiac silhouette. Aortic Atherosclerosis (ICD10-I70.0). Electronically Signed   By: Lavonia Dana M.D.   On: 10/08/2021 08:48   VAS [name] LOWER EXTREMITY VENOUS (DVT)  Result Date: 09/21/2021  Lower Venous DVT Study Patient Name:  ANNTONETTE MADEWELL  Date of Exam:   09/21/2021 Medical Rec #: [id number]     Accession #:    [id number] Date of Birth: Oct 11, 1930     Patient Gender: F Patient Age:   65 years Exam [facility]  [facility] Procedure:      VAS [name] LOWER EXTREMITY  VENOUS (DVT) Referring Phys: RAVI PAHWANI --------------------------------------------------------------------------------  Indications: Pulmonary embolism.  Comparison Study: No previous exam Performing Technologist: Bobetta Lime BS, RVT  Examination Guidelines: A complete evaluation includes B-mode imaging, spectral Doppler, color Doppler, and power Doppler as needed of all accessible portions of each vessel. Bilateral testing is considered an integral part of a complete examination. Limited examinations for reoccurring indications may be performed as noted. The reflux portion of the exam is performed with the patient in reverse Trendelenburg.  +---------+---------------+---------+-----------+----------+--------------+ RIGHT    CompressibilityPhasicitySpontaneityPropertiesThrombus Aging +---------+---------------+---------+-----------+----------+--------------+ CFV      Full           Yes      Yes                                 +---------+---------------+---------+-----------+----------+--------------+ SFJ      Full                                                        +---------+---------------+---------+-----------+----------+--------------+ FV Prox  Full                                                        +---------+---------------+---------+-----------+----------+--------------+ FV Mid   Full                                                        +---------+---------------+---------+-----------+----------+--------------+ FV DistalFull                                                        +---------+---------------+---------+-----------+----------+--------------+ PFV      Full                                                        +---------+---------------+---------+-----------+----------+--------------+ POP      None           No       No                   Acute           +---------+---------------+---------+-----------+----------+--------------+ PTV      None           No       No                   Acute          +---------+---------------+---------+-----------+----------+--------------+ PERO     None           No       No  Acute          +---------+---------------+---------+-----------+----------+--------------+   +---------+---------------+---------+-----------+----------+--------------+ LEFT     CompressibilityPhasicitySpontaneityPropertiesThrombus Aging +---------+---------------+---------+-----------+----------+--------------+ CFV      Full           Yes      Yes                                 +---------+---------------+---------+-----------+----------+--------------+ SFJ      Full                                                        +---------+---------------+---------+-----------+----------+--------------+ FV Prox  Full                                                        +---------+---------------+---------+-----------+----------+--------------+ FV Mid   Full                                                        +---------+---------------+---------+-----------+----------+--------------+ FV DistalFull                                                        +---------+---------------+---------+-----------+----------+--------------+ PFV      Full                                                        +---------+---------------+---------+-----------+----------+--------------+ POP      Full           Yes      Yes                                 +---------+---------------+---------+-----------+----------+--------------+ PTV      Full                                                        +---------+---------------+---------+-----------+----------+--------------+ PERO     Full                                                         +---------+---------------+---------+-----------+----------+--------------+     Summary: BILATERAL: -No evidence of popliteal cyst, bilaterally. RIGHT: - Findings consistent with acute deep vein thrombosis involving the right popliteal vein, right posterior tibial veins, and right peroneal  veins.  LEFT: - No evidence of deep vein thrombosis in the lower extremity. No indirect evidence of obstruction proximal to the inguinal ligament.  *See table(s) above for measurements and observations. Electronically signed by Deitra Mayo MD on 09/21/2021 at 7:19:32 PM.    Final    DG Tibia/Fibula Right  Result Date: 09/21/2021 CLINICAL DATA:  88 year old female with right lower extremity pain after fall. EXAM: RIGHT TIBIA AND FIBULA - 2 VIEW COMPARISON:  None Available. FINDINGS: There is no evidence of fracture or other focal bone lesions. Soft tissues are unremarkable. IMPRESSION: No acute fracture or malalignment. Electronically Signed   By: Ruthann Cancer M.D.   On: 09/21/2021 11:22   CT Angio Chest PE W and/or Wo Contrast  Result Date: 09/20/2021 CLINICAL DATA:  Progressive weakness for 2 weeks, lower extremity edema EXAM: CT ANGIOGRAPHY CHEST WITH CONTRAST TECHNIQUE: Multidetector CT imaging of the chest was performed using the standard protocol during bolus administration of intravenous contrast. Multiplanar CT image reconstructions and MIPs were obtained to evaluate the vascular anatomy. RADIATION DOSE REDUCTION: This exam was performed according to the departmental dose-optimization program which includes automated exposure control, adjustment of the mA and/or kV according to patient size and/or use of iterative reconstruction technique. CONTRAST:  128m OMNIPAQUE IOHEXOL 350 MG/ML SOLN COMPARISON:  09/20/2021 FINDINGS: Cardiovascular: This is a technically adequate evaluation of the pulmonary vasculature. There is a large pulmonary embolus within the right main pulmonary artery, with complete occlusion of  the right middle and right lower lobe pulmonary arteries as result. Central embolus is seen within the left main pulmonary artery, extending into the segmental branches. There is a large clot burden, with evidence of right ventricular strain with RV/LV ratio measuring 1.48. There is significant right atrial dilation with reflux of contrast into the hepatic veins consistent with cardiac dysfunction. No pericardial effusion. Normal caliber of the thoracic aorta. Atherosclerosis of the aorta and coronary vasculature. Mediastinum/Nodes: No enlarged mediastinal, hilar, or axillary lymph nodes. Thyroid gland, trachea, and esophagus demonstrate no significant findings. Lungs/Pleura: Dense right middle and right lower lobe consolidation likely reflect pulmonary infarct given large central pulmonary emboli. Moderate right pleural effusion measures less than 1 L. No pneumothorax. Upper Abdomen: Calcified gallstones. No other acute upper abdominal findings. Musculoskeletal: No acute or destructive bony lesions. Reconstructed images demonstrate no additional findings. Review of the MIP images confirms the above findings. IMPRESSION: 1. Large central and segmental bilateral pulmonary emboli, most pronounced within the right main pulmonary artery as above. CT evidence of right heart strain (RV/LV Ratio = 1.48) consistent with at least submassive (intermediate risk) PE. The presence of right heart strain has been associated with an increased risk of morbidity and mortality. Please refer to the "Code PE Focused" order set in EPIC. 2. Dense right middle and right lower lobe consolidation most consistent with pulmonary infarct given central pulmonary emboli. 3. Moderate right pleural effusion measures less than 1 L. 4.  Aortic Atherosclerosis (ICD10-I70.0). 5. Cholelithiasis. Critical Value/emergent results were called by telephone at the time of interpretation on 09/20/2021 at 10:17 pm to [facility] who verbally acknowledged  these results. Electronically Signed   By: MRanda NgoM.D.   On: 09/20/2021 22:19   CT HEAD WO CONTRAST (5MM)  Result Date: 09/20/2021 CLINICAL DATA:  Head trauma, minor (Age >= 65y); Neck trauma (Age >= 65y) EXAM: CT HEAD WITHOUT CONTRAST CT CERVICAL SPINE WITHOUT CONTRAST TECHNIQUE: Multidetector CT imaging of the head and cervical spine was performed following the  standard protocol without intravenous contrast. Multiplanar CT image reconstructions of the cervical spine were also generated. RADIATION DOSE REDUCTION: This exam was performed according to the departmental dose-optimization program which includes automated exposure control, adjustment of the mA and/or kV according to patient size and/or use of iterative reconstruction technique. COMPARISON:  None Available. FINDINGS: CT HEAD FINDINGS BRAIN: BRAIN Cerebral ventricle sizes are concordant with the degree of cerebral volume loss. Patchy and confluent areas of decreased attenuation are noted throughout the deep and periventricular white matter of the cerebral hemispheres bilaterally, compatible with chronic microvascular ischemic disease. No evidence of large-territorial acute infarction. No parenchymal hemorrhage. No mass lesion. No extra-axial collection. No mass effect or midline shift. No hydrocephalus. Basilar cisterns are patent. Vascular: No hyperdense vessel. Skull: No acute fracture or focal lesion. Sinuses/Orbits: Paranasal sinuses and mastoid air cells are clear. Bilateral lens replacement. Otherwise The orbits are unremarkable. Other: None. CT CERVICAL SPINE FINDINGS Alignment: Normal. Skull base and vertebrae: Multilevel severe degenerative changes of the spine. No severe osseous neural foraminal or central canal stenosis. No acute fracture. No aggressive appearing focal osseous lesion or focal pathologic process. Soft tissues and spinal canal: No prevertebral fluid or swelling. No visible canal hematoma. Upper chest: Unremarkable.  Other: Right pleural effusion. Please see separately dictated CT angiography 10/10/2021 chest for positive findings. Atherosclerotic plaque of the carotid arteries within the neck. IMPRESSION: 1. No acute intracranial abnormality. 2. No acute displaced fracture or traumatic listhesis of the cervical spine. 3. Right pleural effusion. Please see separately dictated CT angiography 10/10/2021 chest for positive findings. Electronically Signed   By: Iven Finn M.D.   On: 09/20/2021 22:13   CT Cervical Spine Wo Contrast  Result Date: 09/20/2021 CLINICAL DATA:  Head trauma, minor (Age >= 65y); Neck trauma (Age >= 65y) EXAM: CT HEAD WITHOUT CONTRAST CT CERVICAL SPINE WITHOUT CONTRAST TECHNIQUE: Multidetector CT imaging of the head and cervical spine was performed following the standard protocol without intravenous contrast. Multiplanar CT image reconstructions of the cervical spine were also generated. RADIATION DOSE REDUCTION: This exam was performed according to the departmental dose-optimization program which includes automated exposure control, adjustment of the mA and/or kV according to patient size and/or use of iterative reconstruction technique. COMPARISON:  None Available. FINDINGS: CT HEAD FINDINGS BRAIN: BRAIN Cerebral ventricle sizes are concordant with the degree of cerebral volume loss. Patchy and confluent areas of decreased attenuation are noted throughout the deep and periventricular white matter of the cerebral hemispheres bilaterally, compatible with chronic microvascular ischemic disease. No evidence of large-territorial acute infarction. No parenchymal hemorrhage. No mass lesion. No extra-axial collection. No mass effect or midline shift. No hydrocephalus. Basilar cisterns are patent. Vascular: No hyperdense vessel. Skull: No acute fracture or focal lesion. Sinuses/Orbits: Paranasal sinuses and mastoid air cells are clear. Bilateral lens replacement. Otherwise The orbits are unremarkable. Other:  None. CT CERVICAL SPINE FINDINGS Alignment: Normal. Skull base and vertebrae: Multilevel severe degenerative changes of the spine. No severe osseous neural foraminal or central canal stenosis. No acute fracture. No aggressive appearing focal osseous lesion or focal pathologic process. Soft tissues and spinal canal: No prevertebral fluid or swelling. No visible canal hematoma. Upper chest: Unremarkable. Other: Right pleural effusion. Please see separately dictated CT angiography 10/10/2021 chest for positive findings. Atherosclerotic plaque of the carotid arteries within the neck. IMPRESSION: 1. No acute intracranial abnormality. 2. No acute displaced fracture or traumatic listhesis of the cervical spine. 3. Right pleural effusion. Please see separately dictated CT angiography 10/10/2021 chest  for positive findings. Electronically Signed   By: Iven Finn M.D.   On: 09/20/2021 22:13   DG Shoulder Left  Result Date: 09/20/2021 CLINICAL DATA:  Left shoulder pain EXAM: LEFT SHOULDER - 2+ VIEW COMPARISON:  August 02, 2021 FINDINGS: Diffuse osteopenia. No acute fracture or dislocation. Moderate glenohumeral and acromioclavicular joint degenerative changes. Decreased subacromial space, suggestive of chronic rotator cuff pathology. The visualized left lung is clear. IMPRESSION: 1. No acute fracture or dislocation. 2. Left glenohumeral and acromioclavicular degenerative changes, similar to prior. Electronically Signed   By: Beryle Flock M.D.   On: 09/20/2021 14:08   DG Chest Portable 1 View  Result Date: 09/20/2021 CLINICAL DATA:  Left shoulder pain, chest pain EXAM: PORTABLE CHEST 1 VIEW COMPARISON:  08/13/2021 FINDINGS: Right lower and middle lobe opacities. Central pulmonary vascular congestion. Small right pleural effusion is not excluded. No pneumothorax. Cardiomegaly. IMPRESSION: Central pulmonary vascular congestion with right lower and middle lobe opacities. Small right pleural effusion is not excluded.  Mild cardiomegaly. May reflect pneumonia or edema. Electronically Signed   By: Macy Mis M.D.   On: 09/20/2021 14:05    Microbiology: Results for orders placed or performed during the hospital encounter of 10/08/21  MRSA Next Gen by PCR, Nasal     Status: None   Collection Time: 10/08/21 10:36 PM   Specimen: Nasal Mucosa; Nasal Swab  Result Value Ref Range Status   MRSA by PCR Next Gen NOT DETECTED NOT DETECTED Final    Comment: (NOTE) The GeneXpert MRSA Assay (FDA approved for NASAL specimens only), is one component of a comprehensive MRSA colonization surveillance program. It is not intended to diagnose MRSA infection nor to guide or monitor treatment for MRSA infections. Test performance is not FDA approved in patients less than 60 years old. Performed at [facility], [place] [address], [place], [place] [address]     Labs: CBC: Recent Labs  Lab 10/09/21 0630 10/10/21 0038 10/11/21 0125 10/11/21 0604 10/12/21 0054  WBC 8.4 8.5 9.4 13.2* 10.1  NEUTROABS  --  6.6  --   --   --   HGB 10.5* 9.8* 7.2* 10.6* 10.0*  HCT 31.9* 28.5* 23.5* 32.4* 30.4*  MCV 91.4 89.6 100.4* 94.2 93.0  PLT 416* 371 265 371 924   Basic Metabolic Panel: Recent Labs  Lab 10/08/21 0839 10/08/21 2047 10/09/21 0630 10/10/21 0038 10/11/21 0533 10/12/21 0054  NA 136  --  133* 134* 132* 135  K 2.8* 3.0* 4.3 4.1 3.4* 4.4  CL 99  --  101 103 105 105  CO2 28  --  20* 20* 24 23  GLUCOSE 109*  --  110* 118* 153* 146*  BUN 11  --  '11 15 18 19  '$ CREATININE 0.69  --  0.76 0.92 0.96 0.82  CALCIUM 8.7*  --  8.5* 8.3* 7.7* 8.4*  MG  --  1.3*  --  2.0  --   --   PHOS  --  2.7  --   --   --   --    Liver Function Tests: No results for input(s): "AST", "ALT", "ALKPHOS", "BILITOT", "PROT", "ALBUMIN" in the last 168 hours. CBG: Recent Labs  Lab 10/09/21 1145 10/11/21 0222  GLUCAP 111* 144*    Discharge time spent: greater than 30 minutes.  Signed: Elmarie Shiley, MD [facility] 10/12/2021

## 2021-10-12 NOTE — Plan of Care (Signed)

## 2021-10-12 NOTE — TOC Transition Note (Signed)
Transition of Care [facility]) - CM/SW Discharge Note   Patient Details  Name: Judy [name] MRN: [id number] Date of Birth: [birth year]  Transition of Care [facility]) CM/SW Contact:  Tresa Endo Phone Number: 10/12/2021, 11:20 AM   Clinical Narrative:    Patient will DC to: [facility] Anticipated DC date: 10/12/2021 Family notified: Pt Nephew  Transport by: Corey Harold    Per MD patient ready for DC to [facility] RN to call report prior to discharge [phone] ). RN, patient, patient's family, and facility notified of DC. Discharge Summary and FL2 sent to facility. DC packet on chart. Ambulance transport requested for patient.   CSW will sign off for now as social work intervention is no longer needed. Please consult [name] again if new needs arise.           Patient Goals and CMS Choice        Discharge Placement                       Discharge Plan and Services                                     Social Determinants of Health (SDOH) Interventions     Readmission Risk Interventions     No data to display

## 2021-10-14 DIAGNOSIS — F03918 Unspecified dementia, unspecified severity, with other behavioral disturbance: Secondary | ICD-10-CM | POA: Diagnosis not present

## 2021-10-14 DIAGNOSIS — I10 Essential (primary) hypertension: Secondary | ICD-10-CM | POA: Diagnosis not present

## 2021-10-14 DIAGNOSIS — N390 Urinary tract infection, site not specified: Secondary | ICD-10-CM | POA: Diagnosis not present

## 2021-10-14 DIAGNOSIS — E785 Hyperlipidemia, unspecified: Secondary | ICD-10-CM | POA: Diagnosis not present

## 2021-10-14 DIAGNOSIS — M542 Cervicalgia: Secondary | ICD-10-CM | POA: Diagnosis not present

## 2021-10-14 DIAGNOSIS — Z9181 History of falling: Secondary | ICD-10-CM | POA: Diagnosis not present

## 2021-10-14 DIAGNOSIS — I4891 Unspecified atrial fibrillation: Secondary | ICD-10-CM | POA: Diagnosis not present

## 2021-10-14 DIAGNOSIS — E860 Dehydration: Secondary | ICD-10-CM | POA: Diagnosis not present

## 2021-10-14 DIAGNOSIS — M4800 Spinal stenosis, site unspecified: Secondary | ICD-10-CM | POA: Diagnosis not present

## 2021-10-14 DIAGNOSIS — M25512 Pain in left shoulder: Secondary | ICD-10-CM | POA: Diagnosis not present

## 2021-10-14 DIAGNOSIS — Z853 Personal history of malignant neoplasm of breast: Secondary | ICD-10-CM | POA: Diagnosis not present

## 2021-10-19 DIAGNOSIS — M6281 Muscle weakness (generalized): Secondary | ICD-10-CM | POA: Diagnosis not present

## 2021-10-19 DIAGNOSIS — Z66 Do not resuscitate: Secondary | ICD-10-CM | POA: Diagnosis not present

## 2021-10-19 DIAGNOSIS — I2692 Saddle embolus of pulmonary artery without acute cor pulmonale: Secondary | ICD-10-CM | POA: Diagnosis not present

## 2021-10-19 DIAGNOSIS — R627 Adult failure to thrive: Secondary | ICD-10-CM | POA: Diagnosis not present

## 2021-10-19 DIAGNOSIS — R54 Age-related physical debility: Secondary | ICD-10-CM | POA: Diagnosis not present

## 2021-10-19 DIAGNOSIS — I10 Essential (primary) hypertension: Secondary | ICD-10-CM | POA: Diagnosis not present

## 2021-10-19 DIAGNOSIS — R296 Repeated falls: Secondary | ICD-10-CM | POA: Diagnosis not present

## 2021-10-19 DIAGNOSIS — I482 Chronic atrial fibrillation, unspecified: Secondary | ICD-10-CM | POA: Diagnosis not present

## 2021-10-19 DIAGNOSIS — F01B4 Vascular dementia, moderate, with anxiety: Secondary | ICD-10-CM | POA: Diagnosis not present

## 2021-10-20 NOTE — Progress Notes (Deleted)
Error

## 2021-10-24 NOTE — Progress Notes (Deleted)
Cardiology Office Note:    Date:  10/24/2021   ID:  JAE BRUCK, DOB 04-24-1930, MRN [id number]  PCP:  No primary care provider on file.   [facility] Providers Cardiologist:  Pixie Casino, MD { Click to update primary MD,subspecialty MD or APP then REFRESH:1}    Referring MD: Mayra Neer, MD   No chief complaint on file. ***  History of Present Illness:    Judy [name] is a 88 y.o. female with a hx of ***  Dyslipidemia Paroxysmal A-fib (not on blood thinner previously due to high fall risk) HTN Pulmonary emboli Demand ischemia Dementia GERD Chronic back pain Hx of breast cancer Chronic HFrEF Hx of frequent falls   She was first evaluated by cardiology services in 2014 and was seen by Dr. Irish Lack for hypertension. Most recently, she presented to the ED on September 20, 2021 from her facility with increased weakness, A-fib with RVR, and elevated BP. Was a poor historian d/t hx of dementia. CT chest revealed a moderate to large burden PE with right heart strain, and was started on heparin drip and was eventually transitioned to Eliquis.  Lower extremity Dopplers positive for RLE DVT. Pt refused echocardiogram to be done.  Toprol-XL was increased to 150 mg daily for better control for A-fib.  PT/OT recommended HH, this was arranged for her. Transitioned to the nursing home after discharge.  On October 08, 2021 she presented back to the ED from SNF due to mechanical fall, hit right forehead, found to be in rapid A-fib in ED.  BP elevated, CT head negative for anything acute.  Hypokalemic, K = 2.8.  Hospital course complicated by encephalopathy with agitation, lethargic periods, Foley catheter placed for urinary retention, received IV and p.o. treatment for UTI.  Moderate right pleural effusion noted due to patient's history of right-sided breast cancer, thoracentesis was discussed with family, plan to hold at that time.  Today presents for follow-up.  Took Eliquis 10 mg  twice daily until September 28, 2021, thereafter 5 mg twice daily for at least the next 6 months to 1 year.  Today she presents for follow-up.   Atrial fibrillaiton HTN PE/DVT Dyslipidemia Pleural effusion   Past Medical History:  Diagnosis Date   Blood transfusion    Breast cancer, left ([place]) 09/26/2011   Punch biopsies of the left nipple done on 09/16/2011.  ER and PR were negative.  HER2 is positive with ratio 4.68.Had mastectomy on 10/25/11. Path T1,N0.    Chronic low back pain    Dyslipidemia    GERD (gastroesophageal reflux disease)    HTN (hypertension)    Osteoarthritis    Osteoporosis     Past Surgical History:  Procedure Laterality Date   BACK SURGERY  2009   lumb lam   BREAST SURGERY  2009   lt lumpectomy-plus 2 re-excisions for margins   left breast mastectomy  2013   MASTECTOMY Left    ROTATOR CUFF REPAIR     Bilateral, 2010   TOTAL ABDOMINAL HYSTERECTOMY      Current Medications: No outpatient medications have been marked as taking for the 10/25/21 encounter (Appointment) with Deberah Pelton, NP.     Allergies:   Amoxil [amoxicillin], Codeine, Aricept [donepezil hcl], Catapres [clonidine], Hycodan [hydrocodone bit-homatrop mbr], Norco [hydrocodone-acetaminophen], Norvasc [amlodipine], Prolia [denosumab], Ultram [tramadol], Zoloft [sertraline], and Clavulanic acid   Social History   Socioeconomic History   Marital status: Married    Spouse name: Not on file  Number of children: Not on file   Years of education: Not on file   Highest education level: Not on file  Occupational History   Not on file  Tobacco Use   Smoking status: Never   Smokeless tobacco: Never  Substance and Sexual Activity   Alcohol use: No   Drug use: No   Sexual activity: Not on file  Other Topics Concern   Not on file  Social History Narrative   Not on file   Social Determinants of Health   Financial Resource Strain: Not on file  Food Insecurity: Not on file   Transportation Needs: Not on file  Physical Activity: Not on file  Stress: Not on file  Social Connections: Not on file     Family History: The patient's ***family history includes Breast cancer in her sister; Cancer in her sister and sister; Colon cancer in her brother; Diabetes in her son. There is no history of Esophageal cancer, Rectal cancer, or Stomach cancer.  ROS:   Please see the history of present illness.    *** All other systems reviewed and are negative.  EKGs/Labs/Other Studies Reviewed:    The following studies were reviewed today: ***  EKG:  EKG is *** ordered today.  The ekg ordered today demonstrates ***  Recent Labs: 08/03/2021: TSH 1.298 09/20/2021: ALT 21 10/10/2021: Magnesium 2.0 10/12/2021: BUN 19; Creatinine, Ser 0.82; Hemoglobin 10.0; Platelets 344; Potassium 4.4; Sodium 135  Recent Lipid Panel No results found for: "CHOL", "TRIG", "HDL", "CHOLHDL", "VLDL", "LDLCALC", "LDLDIRECT"   Risk Assessment/Calculations:   {Does this patient have ATRIAL FIBRILLATION?:[phone]}  No BP recorded.  {Refresh Note OR Click here to enter BP  :1}***         Physical Exam:    VS:  There were no vitals taken for this visit.    Wt Readings from Last 3 Encounters:  10/12/21 124 lb 9 oz (56.5 kg)  09/23/21 142 lb 3.2 oz (64.5 kg)  08/02/21 145 lb (65.8 kg)     GEN: *** Well nourished, well developed in no acute distress HEENT: Normal NECK: No JVD; No carotid bruits LYMPHATICS: No lymphadenopathy CARDIAC: ***RRR, no murmurs, rubs, gallops RESPIRATORY:  Clear to auscultation without rales, wheezing or rhonchi  ABDOMEN: Soft, non-tender, non-distended MUSCULOSKELETAL:  No edema; No deformity  SKIN: Warm and dry NEUROLOGIC:  Alert and oriented x 3 PSYCHIATRIC:  Normal affect   ASSESSMENT:    No diagnosis found. PLAN:    In order of problems listed above:  ***      {Are you ordering a CV Procedure (e.g. stress test, cath, DCCV, TEE, etc)?   Press F2         :[id number]}    Medication Adjustments/Labs and Tests Ordered: Current medicines are reviewed at length with the patient today.  Concerns regarding medicines are outlined above.  No orders of the defined types were placed in this encounter.  No orders of the defined types were placed in this encounter.   There are no Patient Instructions on file for this visit.   Signed, Finis Bud, NP  10/24/2021 6:43 PM    [place]

## 2021-10-25 ENCOUNTER — Ambulatory Visit: Payer: Medicare Other | Admitting: General Practice

## 2021-10-26 ENCOUNTER — Other Ambulatory Visit (HOSPITAL_COMMUNITY): Payer: Self-pay

## 2021-10-26 DIAGNOSIS — Z66 Do not resuscitate: Secondary | ICD-10-CM | POA: Diagnosis not present

## 2021-10-26 DIAGNOSIS — I2692 Saddle embolus of pulmonary artery without acute cor pulmonale: Secondary | ICD-10-CM | POA: Diagnosis not present

## 2021-10-26 DIAGNOSIS — R54 Age-related physical debility: Secondary | ICD-10-CM | POA: Diagnosis not present

## 2021-10-26 DIAGNOSIS — F01B4 Vascular dementia, moderate, with anxiety: Secondary | ICD-10-CM | POA: Diagnosis not present

## 2021-10-30 ENCOUNTER — Emergency Department (HOSPITAL_COMMUNITY)

## 2021-10-30 ENCOUNTER — Other Ambulatory Visit: Payer: Self-pay

## 2021-10-30 ENCOUNTER — Encounter (HOSPITAL_COMMUNITY): Payer: Self-pay | Admitting: Emergency Medicine

## 2021-10-30 ENCOUNTER — Emergency Department (HOSPITAL_COMMUNITY)
Admission: EM | Admit: 2021-10-30 | Discharge: 2021-10-31 | DRG: 308 | Disposition: A | Discharge status: Skilled Nursing Facility | Attending: Internal Medicine | Admitting: Internal Medicine

## 2021-10-30 DIAGNOSIS — Z800 Family history of malignant neoplasm of digestive organs: Secondary | ICD-10-CM | POA: Diagnosis not present

## 2021-10-30 DIAGNOSIS — S3993XA Unspecified injury of pelvis, initial encounter: Secondary | ICD-10-CM | POA: Diagnosis not present

## 2021-10-30 DIAGNOSIS — Z20822 Contact with and (suspected) exposure to covid-19: Secondary | ICD-10-CM | POA: Diagnosis not present

## 2021-10-30 DIAGNOSIS — M11262 Other chondrocalcinosis, left knee: Secondary | ICD-10-CM | POA: Diagnosis not present

## 2021-10-30 DIAGNOSIS — Z86711 Personal history of pulmonary embolism: Secondary | ICD-10-CM | POA: Diagnosis not present

## 2021-10-30 DIAGNOSIS — Z9012 Acquired absence of left breast and nipple: Secondary | ICD-10-CM | POA: Diagnosis not present

## 2021-10-30 DIAGNOSIS — I2699 Other pulmonary embolism without acute cor pulmonale: Secondary | ICD-10-CM | POA: Diagnosis present

## 2021-10-30 DIAGNOSIS — I5022 Chronic systolic (congestive) heart failure: Secondary | ICD-10-CM | POA: Diagnosis present

## 2021-10-30 DIAGNOSIS — J189 Pneumonia, unspecified organism: Secondary | ICD-10-CM | POA: Diagnosis present

## 2021-10-30 DIAGNOSIS — S0003XA Contusion of scalp, initial encounter: Secondary | ICD-10-CM | POA: Diagnosis not present

## 2021-10-30 DIAGNOSIS — Z880 Allergy status to penicillin: Secondary | ICD-10-CM

## 2021-10-30 DIAGNOSIS — S7002XA Contusion of left hip, initial encounter: Secondary | ICD-10-CM | POA: Diagnosis not present

## 2021-10-30 DIAGNOSIS — Z885 Allergy status to narcotic agent status: Secondary | ICD-10-CM

## 2021-10-30 DIAGNOSIS — F0390 Unspecified dementia without behavioral disturbance: Secondary | ICD-10-CM | POA: Diagnosis not present

## 2021-10-30 DIAGNOSIS — N200 Calculus of kidney: Secondary | ICD-10-CM | POA: Diagnosis not present

## 2021-10-30 DIAGNOSIS — I499 Cardiac arrhythmia, unspecified: Secondary | ICD-10-CM | POA: Diagnosis not present

## 2021-10-30 DIAGNOSIS — G9341 Metabolic encephalopathy: Secondary | ICD-10-CM | POA: Diagnosis present

## 2021-10-30 DIAGNOSIS — R296 Repeated falls: Secondary | ICD-10-CM | POA: Diagnosis present

## 2021-10-30 DIAGNOSIS — Z515 Encounter for palliative care: Secondary | ICD-10-CM

## 2021-10-30 DIAGNOSIS — M545 Low back pain, unspecified: Secondary | ICD-10-CM | POA: Diagnosis present

## 2021-10-30 DIAGNOSIS — I110 Hypertensive heart disease with heart failure: Secondary | ICD-10-CM | POA: Diagnosis not present

## 2021-10-30 DIAGNOSIS — E785 Hyperlipidemia, unspecified: Secondary | ICD-10-CM | POA: Diagnosis present

## 2021-10-30 DIAGNOSIS — Z803 Family history of malignant neoplasm of breast: Secondary | ICD-10-CM

## 2021-10-30 DIAGNOSIS — R918 Other nonspecific abnormal finding of lung field: Secondary | ICD-10-CM | POA: Diagnosis not present

## 2021-10-30 DIAGNOSIS — Z66 Do not resuscitate: Secondary | ICD-10-CM | POA: Diagnosis present

## 2021-10-30 DIAGNOSIS — R404 Transient alteration of awareness: Secondary | ICD-10-CM | POA: Diagnosis not present

## 2021-10-30 DIAGNOSIS — I4892 Unspecified atrial flutter: Secondary | ICD-10-CM | POA: Diagnosis not present

## 2021-10-30 DIAGNOSIS — I480 Paroxysmal atrial fibrillation: Secondary | ICD-10-CM | POA: Diagnosis not present

## 2021-10-30 DIAGNOSIS — D649 Anemia, unspecified: Secondary | ICD-10-CM | POA: Diagnosis present

## 2021-10-30 DIAGNOSIS — Z833 Family history of diabetes mellitus: Secondary | ICD-10-CM

## 2021-10-30 DIAGNOSIS — R6889 Other general symptoms and signs: Secondary | ICD-10-CM | POA: Diagnosis not present

## 2021-10-30 DIAGNOSIS — Z7901 Long term (current) use of anticoagulants: Secondary | ICD-10-CM | POA: Diagnosis not present

## 2021-10-30 DIAGNOSIS — K219 Gastro-esophageal reflux disease without esophagitis: Secondary | ICD-10-CM | POA: Diagnosis present

## 2021-10-30 DIAGNOSIS — Z79899 Other long term (current) drug therapy: Secondary | ICD-10-CM

## 2021-10-30 DIAGNOSIS — W19XXXA Unspecified fall, initial encounter: Secondary | ICD-10-CM | POA: Diagnosis present

## 2021-10-30 DIAGNOSIS — T1490XA Injury, unspecified, initial encounter: Secondary | ICD-10-CM | POA: Diagnosis not present

## 2021-10-30 DIAGNOSIS — J90 Pleural effusion, not elsewhere classified: Secondary | ICD-10-CM | POA: Diagnosis not present

## 2021-10-30 DIAGNOSIS — Z888 Allergy status to other drugs, medicaments and biological substances status: Secondary | ICD-10-CM

## 2021-10-30 DIAGNOSIS — Z743 Need for continuous supervision: Secondary | ICD-10-CM | POA: Diagnosis not present

## 2021-10-30 DIAGNOSIS — R911 Solitary pulmonary nodule: Secondary | ICD-10-CM | POA: Diagnosis present

## 2021-10-30 DIAGNOSIS — I509 Heart failure, unspecified: Secondary | ICD-10-CM

## 2021-10-30 DIAGNOSIS — K5730 Diverticulosis of large intestine without perforation or abscess without bleeding: Secondary | ICD-10-CM | POA: Diagnosis not present

## 2021-10-30 DIAGNOSIS — T148XXA Other injury of unspecified body region, initial encounter: Secondary | ICD-10-CM

## 2021-10-30 DIAGNOSIS — G8929 Other chronic pain: Secondary | ICD-10-CM | POA: Diagnosis present

## 2021-10-30 DIAGNOSIS — J9601 Acute respiratory failure with hypoxia: Secondary | ICD-10-CM | POA: Diagnosis present

## 2021-10-30 DIAGNOSIS — S8992XA Unspecified injury of left lower leg, initial encounter: Secondary | ICD-10-CM | POA: Diagnosis not present

## 2021-10-30 DIAGNOSIS — Z853 Personal history of malignant neoplasm of breast: Secondary | ICD-10-CM | POA: Diagnosis not present

## 2021-10-30 DIAGNOSIS — K8020 Calculus of gallbladder without cholecystitis without obstruction: Secondary | ICD-10-CM | POA: Diagnosis not present

## 2021-10-30 DIAGNOSIS — M1712 Unilateral primary osteoarthritis, left knee: Secondary | ICD-10-CM | POA: Diagnosis not present

## 2021-10-30 DIAGNOSIS — Z86718 Personal history of other venous thrombosis and embolism: Secondary | ICD-10-CM | POA: Diagnosis not present

## 2021-10-30 LAB — COMPREHENSIVE METABOLIC PANEL
ALT: 20 U/L (ref 0–44)
AST: 33 U/L (ref 15–41)
Albumin: 2.4 g/dL — ABNORMAL LOW (ref 3.5–5.0)
Alkaline Phosphatase: 111 U/L (ref 38–126)
Anion gap: 10 (ref 5–15)
BUN: 27 mg/dL — ABNORMAL HIGH (ref 8–23)
CO2: 24 mmol/L (ref 22–32)
Calcium: 8.7 mg/dL — ABNORMAL LOW (ref 8.9–10.3)
Chloride: 107 mmol/L (ref 98–111)
Creatinine, Ser: 0.92 mg/dL (ref 0.44–1.00)
GFR, Estimated: 60 mL/min — ABNORMAL LOW (ref >60)
Glucose, Bld: 91 mg/dL (ref 70–99)
Potassium: 3.6 mmol/L (ref 3.5–5.1)
Sodium: 141 mmol/L (ref 135–145)
Total Bilirubin: 1.3 mg/dL — ABNORMAL HIGH (ref 0.3–1.2)
Total Protein: 5.7 g/dL — ABNORMAL LOW (ref 6.5–8.1)

## 2021-10-30 LAB — I-STAT CHEM 8, ED
BUN: 34 mg/dL — ABNORMAL HIGH (ref 8–23)
Calcium, Ion: 1.12 mmol/L — ABNORMAL LOW (ref 1.15–1.40)
Chloride: 106 mmol/L (ref 98–111)
Creatinine, Ser: 0.7 mg/dL (ref 0.44–1.00)
Glucose, Bld: 85 mg/dL (ref 70–99)
HCT: 33 % — ABNORMAL LOW (ref 36.0–46.0)
Hemoglobin: 11.2 g/dL — ABNORMAL LOW (ref 12.0–15.0)
Potassium: 3.8 mmol/L (ref 3.5–5.1)
Sodium: 141 mmol/L (ref 135–145)
TCO2: 25 mmol/L (ref 22–32)

## 2021-10-30 LAB — RESP PANEL BY RT-PCR (FLU A&B, COVID) ARPGX2
Influenza A by PCR: NEGATIVE
Influenza B by PCR: NEGATIVE
SARS Coronavirus 2 by RT PCR: NEGATIVE

## 2021-10-30 LAB — CBC
HCT: 30.5 % — ABNORMAL LOW (ref 36.0–46.0)
Hemoglobin: 9.8 g/dL — ABNORMAL LOW (ref 12.0–15.0)
MCH: 30.4 pg (ref 26.0–34.0)
MCHC: 32.1 g/dL (ref 30.0–36.0)
MCV: 94.7 fL (ref 80.0–100.0)
Platelets: 440 10*3/uL — ABNORMAL HIGH (ref 150–400)
RBC: 3.22 MIL/uL — ABNORMAL LOW (ref 3.87–5.11)
RDW: 18.8 % — ABNORMAL HIGH (ref 11.5–15.5)
WBC: 11.3 10*3/uL — ABNORMAL HIGH (ref 4.0–10.5)
nRBC: 0.2 % (ref 0.0–0.2)

## 2021-10-30 LAB — ETHANOL: Alcohol, Ethyl (B): <10 mg/dL (ref <10)

## 2021-10-30 LAB — PROTIME-INR
INR: 1.4 — ABNORMAL HIGH (ref 0.8–1.2)
Prothrombin Time: 16.7 seconds — ABNORMAL HIGH (ref 11.4–15.2)

## 2021-10-30 LAB — CK TOTAL AND CKMB (NOT AT ARMC)
CK, MB: 6.1 ng/mL — ABNORMAL HIGH (ref 0.5–5.0)
Relative Index: INVALID (ref 0.0–2.5)
Total CK: 81 U/L (ref 38–234)

## 2021-10-30 LAB — SAMPLE TO BLOOD BANK

## 2021-10-30 MED ORDER — IOHEXOL 350 MG/ML SOLN
75.0000 mL | Freq: Once | INTRAVENOUS | Status: AC | PRN
  Administered 2021-10-30: 75 mL via INTRAVENOUS

## 2021-10-30 MED ORDER — HALOPERIDOL LACTATE 5 MG/ML IJ SOLN
2.0000 mg | Freq: Once | INTRAMUSCULAR | Status: AC
  Administered 2021-10-31: 2 mg via INTRAVENOUS
  Filled 2021-10-30: qty 1

## 2021-10-30 MED ORDER — LORAZEPAM 2 MG/ML IJ SOLN
INTRAMUSCULAR | Status: AC
  Administered 2021-10-30: 1 mg via INTRAVENOUS
  Filled 2021-10-30: qty 1

## 2021-10-30 MED ORDER — LORAZEPAM 2 MG/ML IJ SOLN: 1.0000 mg | Freq: Once | INTRAMUSCULAR | Status: AC

## 2021-10-30 NOTE — ED Provider Notes (Cosign Needed Addendum)
[facility] EMERGENCY DEPARTMENT Provider Note   CSN: [id number] Arrival date & time: 10/30/21  2234     History  Chief Complaint  Patient presents with   Judy [name] is a 88 y.o. female. 88 year old female presenting from facility via EMS.  Patient unable to provide history due to dementia and altered mental status.  Per EMS obtained history from the facility, patient at baseline is able to carry a conversation.  She does have dementia.  She sustained an unwitnessed fall approximately 5 hours prior to arrival.  She is on anticoagulation due to atrial fibrillation. HPI     Home Medications Prior to Admission medications   Medication Sig Start Date End Date Taking? Authorizing Provider  acetaminophen (TYLENOL) 500 MG tablet Take 1,000 mg by mouth every 12 (twelve) hours. Taking every 12 hours per [facility]    [provider]  aluminum-magnesium hydroxide-simethicone (MAALOX) [phone] MG/5ML SUSP Take 30 mLs by mouth daily as needed (for constipation).    [provider]  apixaban (ELIQUIS) 5 MG TABS tablet Take 5 mg by mouth 2 (two) times daily.    [provider]  atorvastatin (LIPITOR) 10 MG tablet Take 1 tablet (10 mg total) by mouth daily at 6 PM. Patient taking differently: Take 10 mg by mouth at bedtime. 08/03/21   Little Ishikawa, MD  diltiazem (CARDIZEM CD) 240 MG 24 hr capsule Take 1 capsule (240 mg total) by mouth daily. 10/12/21   Regalado, Belkys A, MD  guaifenesin (ROBITUSSIN) 100 MG/5ML syrup Take 200 mg by mouth every 6 (six) hours as needed for cough.    [provider]  loperamide (IMODIUM) 2 MG capsule Take 2 mg by mouth daily as needed for diarrhea or loose stools.    [provider]  metoprolol tartrate (LOPRESSOR) 100 MG tablet Take 1 tablet (100 mg total) by mouth 2 (two) times daily. 10/12/21   Regalado, Belkys A, MD  neomycin-bacitracin-polymyxin (NEOSPORIN) [phone] ointment Apply 1  Application topically daily as needed (For minor skin tear).    [provider]  Omega-3 Fatty Acids (OMEGA-3 PO) Take 1 capsule by mouth daily.    [provider]  polyethylene glycol powder (GLYCOLAX/MIRALAX) 17 GM/SCOOP powder Take 1 capful (17 g) with water by mouth daily. 10/12/21   Regalado, Belkys A, MD  QUEtiapine (SEROQUEL) 25 MG tablet Take 1 tablet (25 mg total) by mouth at bedtime. 10/12/21   Regalado, Belkys A, MD  saccharomyces boulardii (FLORASTOR) 250 MG capsule Take 250 mg by mouth daily.    [provider]      Allergies    Amoxil [amoxicillin], Codeine, Aricept [donepezil hcl], Catapres [clonidine], Hycodan [hydrocodone bit-homatrop mbr], Norco [hydrocodone-acetaminophen], Norvasc [amlodipine], Prolia [denosumab], Ultram [tramadol], Zoloft [sertraline], and Clavulanic acid    Review of Systems   Review of Systems  Unable to perform ROS: Dementia    Physical Exam Updated Vital Signs BP (!) 143/96   Pulse (!) 115   Temp (!) 96.2 F (35.7 C) (Axillary)   Resp 16   Ht '5\' 4"'$  (1.626 m)   Wt 56.5 kg   SpO2 98%   BMI 21.38 kg/m  Physical Exam Vitals and nursing note reviewed.  Constitutional:      General: She is not in acute distress.    Appearance: She is well-developed.  HENT:     Head: Normocephalic and atraumatic.  Eyes:     Conjunctiva/sclera: Conjunctivae normal.  Cardiovascular:  Rate and Rhythm: Normal rate and regular rhythm.     Heart sounds: No murmur heard. Pulmonary:     Effort: Pulmonary effort is normal. No respiratory distress.     Breath sounds: Normal breath sounds.  Abdominal:     Palpations: Abdomen is soft.     Tenderness: There is no abdominal tenderness.  Musculoskeletal:        General: Swelling (left lateral hip hematoma) present.     Cervical back: Neck supple.  Skin:    General: Skin is warm and dry.     Capillary Refill: Capillary refill takes less than 2 seconds.  Neurological:     Mental Status:  She is alert.  Psychiatric:        Mood and Affect: Mood normal.     ED Results / Procedures / Treatments   Labs (all labs ordered are listed, but only abnormal results are displayed) Labs Reviewed  CBC - Abnormal; Notable for the following components:      Result Value   WBC 11.3 (*)    RBC 3.22 (*)    Hemoglobin 9.8 (*)    HCT 30.5 (*)    RDW 18.8 (*)    Platelets 440 (*)    All other components within normal limits  PROTIME-INR - Abnormal; Notable for the following components:   Prothrombin Time 16.7 (*)    INR 1.4 (*)    All other components within normal limits  I-STAT CHEM 8, ED - Abnormal; Notable for the following components:   BUN 34 (*)    Calcium, Ion 1.12 (*)    Hemoglobin 11.2 (*)    HCT 33.0 (*)    All other components within normal limits  RESP PANEL BY RT-PCR (FLU A&B, COVID) ARPGX2  ETHANOL  COMPREHENSIVE METABOLIC PANEL  URINALYSIS, ROUTINE W REFLEX MICROSCOPIC  LACTIC ACID, PLASMA  CK TOTAL AND CKMB (NOT AT [facility])  SAMPLE TO BLOOD BANK    EKG None  Radiology CT HEAD WO CONTRAST  Result Date: 10/30/2021 CLINICAL DATA:  Head trauma, moderate-severe; Polytrauma, blunt EXAM: CT HEAD WITHOUT CONTRAST CT CERVICAL SPINE WITHOUT CONTRAST TECHNIQUE: Multidetector CT imaging of the head and cervical spine was performed following the standard protocol without intravenous contrast. Multiplanar CT image reconstructions of the cervical spine were also generated. RADIATION DOSE REDUCTION: This exam was performed according to the departmental dose-optimization program which includes automated exposure control, adjustment of the mA and/or kV according to patient size and/or use of iterative reconstruction technique. COMPARISON:  None Available. FINDINGS: CT HEAD FINDINGS BRAIN: BRAIN Cerebral ventricle sizes are concordant with the degree of cerebral volume loss. Patchy and confluent areas of decreased attenuation are noted throughout the deep and periventricular white  matter of the cerebral hemispheres bilaterally, compatible with chronic microvascular ischemic disease. No evidence of large-territorial acute infarction. No parenchymal hemorrhage. No mass lesion. No extra-axial collection. No mass effect or midline shift. No hydrocephalus. Basilar cisterns are patent. Vascular: No hyperdense vessel. Atherosclerotic calcifications are present within the cavernous internal carotid and vertebral arteries. Skull: No acute fracture or focal lesion. Sinuses/Orbits: Paranasal sinuses and mastoid air cells are clear. Bilateral lens replacement. Otherwise the orbits are unremarkable. Other: Left parieto-occipital 6 mm scalp hematoma formation. CT CERVICAL SPINE FINDINGS Alignment: Straightening of the normal cervical lordosis likely due to positioning and degenerative changes. Grade 1 anterolisthesis of C3 on C4, C4 on C5, C7 on T1. Skull base and vertebrae: Multilevel severe degenerative changes of the spine. No severe osseous neural  foraminal or central canal stenosis. Severe multilevel intervertebral disc space narrowing. No acute fracture. No aggressive appearing focal osseous lesion or focal pathologic process. Soft tissues and spinal canal: No prevertebral fluid or swelling. No visible canal hematoma. Upper chest: Right upper lobe airspace opacity. Please see CT chest, abdomen, pelvis 10/30/2021. Other: None. IMPRESSION: 1. No acute intracranial abnormality. 2. No acute displaced fracture or traumatic listhesis of the cervical spine. Electronically Signed   By: Iven Finn M.D.   On: 10/30/2021 23:32   CT CERVICAL SPINE WO CONTRAST  Result Date: 10/30/2021 CLINICAL DATA:  Head trauma, moderate-severe; Polytrauma, blunt EXAM: CT HEAD WITHOUT CONTRAST CT CERVICAL SPINE WITHOUT CONTRAST TECHNIQUE: Multidetector CT imaging of the head and cervical spine was performed following the standard protocol without intravenous contrast. Multiplanar CT image reconstructions of the cervical  spine were also generated. RADIATION DOSE REDUCTION: This exam was performed according to the departmental dose-optimization program which includes automated exposure control, adjustment of the mA and/or kV according to patient size and/or use of iterative reconstruction technique. COMPARISON:  None Available. FINDINGS: CT HEAD FINDINGS BRAIN: BRAIN Cerebral ventricle sizes are concordant with the degree of cerebral volume loss. Patchy and confluent areas of decreased attenuation are noted throughout the deep and periventricular white matter of the cerebral hemispheres bilaterally, compatible with chronic microvascular ischemic disease. No evidence of large-territorial acute infarction. No parenchymal hemorrhage. No mass lesion. No extra-axial collection. No mass effect or midline shift. No hydrocephalus. Basilar cisterns are patent. Vascular: No hyperdense vessel. Atherosclerotic calcifications are present within the cavernous internal carotid and vertebral arteries. Skull: No acute fracture or focal lesion. Sinuses/Orbits: Paranasal sinuses and mastoid air cells are clear. Bilateral lens replacement. Otherwise the orbits are unremarkable. Other: Left parieto-occipital 6 mm scalp hematoma formation. CT CERVICAL SPINE FINDINGS Alignment: Straightening of the normal cervical lordosis likely due to positioning and degenerative changes. Grade 1 anterolisthesis of C3 on C4, C4 on C5, C7 on T1. Skull base and vertebrae: Multilevel severe degenerative changes of the spine. No severe osseous neural foraminal or central canal stenosis. Severe multilevel intervertebral disc space narrowing. No acute fracture. No aggressive appearing focal osseous lesion or focal pathologic process. Soft tissues and spinal canal: No prevertebral fluid or swelling. No visible canal hematoma. Upper chest: Right upper lobe airspace opacity. Please see CT chest, abdomen, pelvis 10/30/2021. Other: None. IMPRESSION: 1. No acute intracranial  abnormality. 2. No acute displaced fracture or traumatic listhesis of the cervical spine. Electronically Signed   By: Iven Finn M.D.   On: 10/30/2021 23:32   DG Knee Left Port  Result Date: 10/30/2021 CLINICAL DATA:  Blunt trauma, fall. EXAM: PORTABLE LEFT KNEE - 1-2 VIEW COMPARISON:  None Available. FINDINGS: No evidence of fracture, dislocation, or joint effusion. Mild-to-moderate tricompartmental degenerative changes. Chondrocalcinosis is noted. Vascular calcifications are present in the soft tissues. IMPRESSION: 1. No acute fracture or dislocation. 2. Mild-to-moderate tricompartmental degenerative changes. 3. Chondrocalcinosis. Electronically Signed   By: Brett Fairy M.D.   On: 10/30/2021 23:10   DG Chest Port 1 View  Result Date: 10/30/2021 CLINICAL DATA:  Trauma. EXAM: PORTABLE CHEST 1 VIEW COMPARISON:  Chest x-ray 10/08/2021.  CT of the chest 10/08/2021. FINDINGS: Aorta is ectatic, unchanged. The heart is enlarged similar to the prior study. Small right pleural effusion is partially loculated. There are minimal strandy opacities at the left lung base favored is atelectasis. No evidence for pneumothorax. No acute fractures are seen. IMPRESSION: Small right pleural effusion, partially loculated. Stable cardiomegaly. Electronically  Signed   By: Ronney Asters M.D.   On: 10/30/2021 23:07   DG Pelvis Portable  Result Date: 10/30/2021 CLINICAL DATA:  Trauma. EXAM: PORTABLE PELVIS 1-2 VIEWS COMPARISON:  Pelvis x-ray 10/08/2021 FINDINGS: Patient's hand overlies the right pelvis. The bones are osteopenic. There is no definite acute fracture or dislocation. Joint spaces are maintained. L5-S1 fusion hardware is visualized. Peripheral vascular calcifications are present. IMPRESSION: 1. Limited evaluation of the right iliac bone secondary to overlying artifact. No definite acute fracture or dislocation. Electronically Signed   By: Ronney Asters M.D.   On: 10/30/2021 23:06    Procedures Procedures     Medications Ordered in ED Medications  LORazepam (ATIVAN) injection 1 mg (1 mg Intravenous Given 10/30/21 2256)  iohexol (OMNIPAQUE) 350 MG/ML injection 75 mL (75 mLs Intravenous Contrast Given 10/30/21 2326)    ED Course/ Medical Decision Making/ A&P                           Medical Decision Making Amount and/or Complexity of Data Reviewed Labs: ordered. Radiology: ordered.  Risk Prescription drug management.   88 year old female with past medical history of dementia, A-fib, heart failure, bilateral PE/right lower extremity DVT diagnosed 09/2021 on Eliquis presenting from facility due to altered mental status and unwitnessed fall approximately 5 hours prior to arrival. EMS reports that at baseline patient is able to carry on a conversation, however on arrival she is screaming, moving all extremities, not following any commands. Vital signs stable on arrival.  Notable hematoma to left posterior scalp and remote appearing hematoma left lateral hip.  Differential diagnosis includes intracranial hemorrhage, fracture, hematoma, contusion, electrolyte abnormalities, AKI, arrhythmia.   EKG did show A-fib with RVR with rate 130s.  However patient is intermittently with rate in the 80s.  Blood pressure is stable.  She does have a history of A-fib.  Full trauma imaging ordered, along with labs.  Results pending at signout.  Patient signed out to oncoming provider at 2300.  Please see their note for remainder of patient's care and final disposition.        Final Clinical Impression(s) / ED Diagnoses Final diagnoses:  None    Rx / DC Orders ED Discharge Orders     None         Rosine Abe, MD 10/30/21 Darci Needle    Rosine Abe, MD 10/30/21 2350    Ezequiel Essex, MD 10/31/21 1004

## 2021-10-30 NOTE — ED Notes (Signed)
Patient to CT.

## 2021-10-30 NOTE — ED Notes (Signed)
Patient has stopped screaming at this time, will only scream when touched.

## 2021-10-30 NOTE — ED Notes (Signed)
1 mg ativan given per verbal order by MD Rancour due to agitation.

## 2021-10-30 NOTE — ED Triage Notes (Signed)
Patient BIB [facility] from [facility].  Staff at facility reports patient had unwitnessed fall at approx 1700 this evening, but reportedly hasn't been "normal" since Wednesday.  Patient yelling and agitated upon arrival, unable to follow commands. Patient has hematoma to back of head.  Patient is on eliquis.    152/82 HR 120-160 afib RVR 22L FA RR 22 250 NaCl

## 2021-10-31 ENCOUNTER — Other Ambulatory Visit (HOSPITAL_COMMUNITY): Payer: Medicare Other

## 2021-10-31 DIAGNOSIS — Z515 Encounter for palliative care: Secondary | ICD-10-CM | POA: Diagnosis not present

## 2021-10-31 DIAGNOSIS — G9341 Metabolic encephalopathy: Secondary | ICD-10-CM

## 2021-10-31 DIAGNOSIS — Z853 Personal history of malignant neoplasm of breast: Secondary | ICD-10-CM | POA: Diagnosis not present

## 2021-10-31 DIAGNOSIS — Z20822 Contact with and (suspected) exposure to covid-19: Secondary | ICD-10-CM | POA: Diagnosis present

## 2021-10-31 DIAGNOSIS — I110 Hypertensive heart disease with heart failure: Secondary | ICD-10-CM | POA: Diagnosis present

## 2021-10-31 DIAGNOSIS — I4892 Unspecified atrial flutter: Secondary | ICD-10-CM | POA: Diagnosis present

## 2021-10-31 DIAGNOSIS — R911 Solitary pulmonary nodule: Secondary | ICD-10-CM | POA: Diagnosis present

## 2021-10-31 DIAGNOSIS — I509 Heart failure, unspecified: Secondary | ICD-10-CM

## 2021-10-31 DIAGNOSIS — F0390 Unspecified dementia without behavioral disturbance: Secondary | ICD-10-CM | POA: Diagnosis present

## 2021-10-31 DIAGNOSIS — W19XXXA Unspecified fall, initial encounter: Secondary | ICD-10-CM | POA: Diagnosis present

## 2021-10-31 DIAGNOSIS — I2699 Other pulmonary embolism without acute cor pulmonale: Secondary | ICD-10-CM

## 2021-10-31 DIAGNOSIS — E785 Hyperlipidemia, unspecified: Secondary | ICD-10-CM | POA: Diagnosis present

## 2021-10-31 DIAGNOSIS — I480 Paroxysmal atrial fibrillation: Secondary | ICD-10-CM | POA: Diagnosis present

## 2021-10-31 DIAGNOSIS — R296 Repeated falls: Secondary | ICD-10-CM | POA: Diagnosis present

## 2021-10-31 DIAGNOSIS — Z86711 Personal history of pulmonary embolism: Secondary | ICD-10-CM | POA: Diagnosis not present

## 2021-10-31 DIAGNOSIS — Z66 Do not resuscitate: Secondary | ICD-10-CM | POA: Diagnosis present

## 2021-10-31 DIAGNOSIS — J9601 Acute respiratory failure with hypoxia: Secondary | ICD-10-CM | POA: Diagnosis present

## 2021-10-31 DIAGNOSIS — Z803 Family history of malignant neoplasm of breast: Secondary | ICD-10-CM | POA: Diagnosis not present

## 2021-10-31 DIAGNOSIS — Z7901 Long term (current) use of anticoagulants: Secondary | ICD-10-CM | POA: Diagnosis not present

## 2021-10-31 DIAGNOSIS — S7002XA Contusion of left hip, initial encounter: Secondary | ICD-10-CM | POA: Diagnosis present

## 2021-10-31 DIAGNOSIS — Z800 Family history of malignant neoplasm of digestive organs: Secondary | ICD-10-CM | POA: Diagnosis not present

## 2021-10-31 DIAGNOSIS — S0003XA Contusion of scalp, initial encounter: Secondary | ICD-10-CM | POA: Diagnosis present

## 2021-10-31 DIAGNOSIS — I5022 Chronic systolic (congestive) heart failure: Secondary | ICD-10-CM | POA: Diagnosis present

## 2021-10-31 DIAGNOSIS — J189 Pneumonia, unspecified organism: Secondary | ICD-10-CM | POA: Diagnosis present

## 2021-10-31 DIAGNOSIS — Z86718 Personal history of other venous thrombosis and embolism: Secondary | ICD-10-CM | POA: Diagnosis not present

## 2021-10-31 DIAGNOSIS — D649 Anemia, unspecified: Secondary | ICD-10-CM | POA: Diagnosis present

## 2021-10-31 DIAGNOSIS — Z9012 Acquired absence of left breast and nipple: Secondary | ICD-10-CM | POA: Diagnosis not present

## 2021-10-31 DIAGNOSIS — T148XXA Other injury of unspecified body region, initial encounter: Secondary | ICD-10-CM

## 2021-10-31 LAB — RESPIRATORY PANEL BY PCR

## 2021-10-31 LAB — I-STAT ARTERIAL BLOOD GAS, ED
Acid-Base Excess: 0 mmol/L (ref 0.0–2.0)
Bicarbonate: 23.5 mmol/L (ref 20.0–28.0)
Calcium, Ion: 1.22 mmol/L (ref 1.15–1.40)
HCT: 28 % — ABNORMAL LOW (ref 36.0–46.0)
Hemoglobin: 9.5 g/dL — ABNORMAL LOW (ref 12.0–15.0)
O2 Saturation: 98 %
Patient temperature: 97
Potassium: 3.5 mmol/L (ref 3.5–5.1)
Sodium: 140 mmol/L (ref 135–145)
TCO2: 25 mmol/L (ref 22–32)
pCO2 arterial: 32.4 mmHg (ref 32–48)
pH, Arterial: 7.464 — ABNORMAL HIGH (ref 7.35–7.45)
pO2, Arterial: 95 mmHg (ref 83–108)

## 2021-10-31 LAB — CBC
HCT: 32.6 % — ABNORMAL LOW (ref 36.0–46.0)
Hemoglobin: 9.7 g/dL — ABNORMAL LOW (ref 12.0–15.0)
MCH: 30.1 pg (ref 26.0–34.0)
MCHC: 29.8 g/dL — ABNORMAL LOW (ref 30.0–36.0)
MCV: 101.2 fL — ABNORMAL HIGH (ref 80.0–100.0)
Platelets: 394 10*3/uL (ref 150–400)
RBC: 3.22 MIL/uL — ABNORMAL LOW (ref 3.87–5.11)
RDW: 18.9 % — ABNORMAL HIGH (ref 11.5–15.5)
WBC: 10.9 10*3/uL — ABNORMAL HIGH (ref 4.0–10.5)
nRBC: 0.2 % (ref 0.0–0.2)

## 2021-10-31 LAB — URINALYSIS, ROUTINE W REFLEX MICROSCOPIC
Bilirubin Urine: NEGATIVE
Glucose, UA: NEGATIVE mg/dL
Hgb urine dipstick: NEGATIVE
Ketones, ur: NEGATIVE mg/dL
Leukocytes,Ua: NEGATIVE
Nitrite: NEGATIVE
Protein, ur: NEGATIVE mg/dL
Specific Gravity, Urine: 1.032 — ABNORMAL HIGH (ref 1.005–1.030)
pH: 5 (ref 5.0–8.0)

## 2021-10-31 LAB — VITAMIN B12: Vitamin B-12: 1515 pg/mL — ABNORMAL HIGH (ref 180–914)

## 2021-10-31 LAB — BASIC METABOLIC PANEL
Anion gap: 15 (ref 5–15)
BUN: 26 mg/dL — ABNORMAL HIGH (ref 8–23)
CO2: 22 mmol/L (ref 22–32)
Calcium: 8.9 mg/dL (ref 8.9–10.3)
Chloride: 105 mmol/L (ref 98–111)
Creatinine, Ser: 0.78 mg/dL (ref 0.44–1.00)
GFR, Estimated: >60 mL/min (ref >60)
Glucose, Bld: 93 mg/dL (ref 70–99)
Potassium: 3.7 mmol/L (ref 3.5–5.1)
Sodium: 142 mmol/L (ref 135–145)

## 2021-10-31 LAB — STREP PNEUMONIAE URINARY ANTIGEN: Strep Pneumo Urinary Antigen: NEGATIVE

## 2021-10-31 LAB — AMMONIA: Ammonia: 12 umol/L (ref 9–35)

## 2021-10-31 LAB — LACTIC ACID, PLASMA: Lactic Acid, Venous: 2.3 mmol/L (ref 0.5–1.9)

## 2021-10-31 LAB — BRAIN NATRIURETIC PEPTIDE: B Natriuretic Peptide: 1157.8 pg/mL — ABNORMAL HIGH (ref 0.0–100.0)

## 2021-10-31 LAB — HEPARIN LEVEL (UNFRACTIONATED): Heparin Unfractionated: >1.1 IU/mL — ABNORMAL HIGH (ref 0.30–0.70)

## 2021-10-31 LAB — APTT: aPTT: 108 seconds — ABNORMAL HIGH (ref 24–36)

## 2021-10-31 LAB — PROCALCITONIN: Procalcitonin: 0.19 ng/mL

## 2021-10-31 LAB — TSH: TSH: 8.147 u[IU]/mL — ABNORMAL HIGH (ref 0.350–4.500)

## 2021-10-31 LAB — MAGNESIUM: Magnesium: 1.9 mg/dL (ref 1.7–2.4)

## 2021-10-31 LAB — TROPONIN I (HIGH SENSITIVITY): Troponin I (High Sensitivity): 89 ng/L — ABNORMAL HIGH (ref <18)

## 2021-10-31 MED ORDER — SODIUM CHLORIDE 0.9 % IV SOLN
1.0000 g | Freq: Once | INTRAVENOUS | Status: AC
  Administered 2021-10-31: 1 g via INTRAVENOUS
  Filled 2021-10-31: qty 10

## 2021-10-31 MED ORDER — SODIUM CHLORIDE 0.9 % IV SOLN
500.0000 mg | Freq: Once | INTRAVENOUS | Status: AC
  Administered 2021-10-31: 500 mg via INTRAVENOUS
  Filled 2021-10-31: qty 5

## 2021-10-31 MED ORDER — DILTIAZEM LOAD VIA INFUSION
10.0000 mg | Freq: Once | INTRAVENOUS | Status: AC
  Administered 2021-10-31: 10 mg via INTRAVENOUS
  Filled 2021-10-31: qty 10

## 2021-10-31 MED ORDER — SODIUM CHLORIDE 0.9 % IV SOLN: 500.0000 mg | INTRAVENOUS | Status: DC

## 2021-10-31 MED ORDER — POTASSIUM CHLORIDE 10 MEQ/100ML IV SOLN
10.0000 meq | INTRAVENOUS | Status: AC
  Administered 2021-10-31 (×4): 10 meq via INTRAVENOUS
  Filled 2021-10-31 (×4): qty 100

## 2021-10-31 MED ORDER — DILTIAZEM HCL-DEXTROSE 125-5 MG/125ML-% IV SOLN (PREMIX)
5.0000 mg/h | INTRAVENOUS | Status: DC
  Administered 2021-10-31: 10 mg/h via INTRAVENOUS
  Administered 2021-10-31: 5 mg/h via INTRAVENOUS
  Filled 2021-10-31 (×2): qty 125

## 2021-10-31 MED ORDER — HEPARIN (PORCINE) 25000 UT/250ML-% IV SOLN
850.0000 [IU]/h | INTRAVENOUS | Status: DC
  Administered 2021-10-31: 900 [IU]/h via INTRAVENOUS
  Filled 2021-10-31: qty 250

## 2021-10-31 MED ORDER — SODIUM CHLORIDE 0.9 % IV SOLN: 2.0000 g | INTRAVENOUS | Status: DC

## 2021-10-31 MED ORDER — MORPHINE SULFATE (PF) 2 MG/ML IV SOLN
1.0000 mg | Freq: Once | INTRAVENOUS | Status: AC
  Administered 2021-10-31: 1 mg via INTRAVENOUS
  Filled 2021-10-31: qty 1

## 2021-10-31 NOTE — ED Notes (Signed)
ED TO INPATIENT HANDOFF REPORT  ED Nurse Name and Phone #: Jeannie Done [phone]  S Name/Age/Gender Judy [name] 88 y.o. female Room/Bed: 018C/018C  Code Status   Code Status: DNR  Home/SNF/Other Skilled nursing facility--  Patient oriented to: self  Is this baseline? Yes   Triage Complete: Triage complete  Chief Complaint Atrial flutter with rapid ventricular response [facility]) [I48.92]  Triage Note Patient BIB [facility] from [facility].  Staff at facility reports patient had unwitnessed fall at approx 1700 this evening, but reportedly hasn't been "normal" since Wednesday.  Patient yelling and agitated upon arrival, unable to follow commands. Patient has hematoma to back of head.  Patient is on eliquis.    152/82 HR 120-160 afib RVR 22L FA RR 22 250 NaCl   Allergies Allergies  Allergen Reactions   Amoxil [Amoxicillin] Nausea Only    NOT ON MAR   Codeine Nausea Only    NOT ON MAR   Aricept [Donepezil Hcl] Other (See Comments)    "Felt drunk" Headaches Drowsiness  NOT ON MAR   Catapres [Clonidine] Other (See Comments)    Dry mouth  NOT ON MAR   Hycodan [Hydrocodone Bit-Homatrop Mbr] Diarrhea and Nausea And Vomiting    NOT ON MAR   Norco [Hydrocodone-Acetaminophen] Diarrhea and Nausea And Vomiting   Norvasc [Amlodipine] Swelling    NOT ON MAR   Prolia [Denosumab] Other (See Comments)    Unknown reaction NOT ON MAR   Ultram [Tramadol] Nausea Only    NOT ON MAR   Zoloft [Sertraline] Nausea Only    NOT ON MAR   Clavulanic Acid Hives and Rash    NOT ON MAR    Level of Care/Admitting Diagnosis ED Disposition     ED Disposition  Admit   Condition  --   Comment  Hospital Area: [place] [[id number]]  Level of Care: Progressive [102]  Admit to Progressive based on following criteria: CARDIOVASCULAR & THORACIC of moderate stability with acute coronary syndrome symptoms/low risk myocardial infarction/hypertensive urgency/arrhythmias/heart  failure potentially compromising stability and stable post cardiovascular intervention patients.  Admit to Progressive based on following criteria: RESPIRATORY PROBLEMS hypoxemic/hypercapnic respiratory failure that is responsive to NIPPV (BiPAP) or High Flow Nasal Cannula (6-80 lpm). Frequent assessment/intervention, no > Q2 hrs < Q4 hrs, to maintain oxygenation and pulmonary hygiene.  May [facility] patient to [facility] or [facility] if equivalent level of care is available:: Yes  Covid Evaluation: Asymptomatic - no recent exposure (last 10 days) testing not required  Diagnosis: Atrial flutter with rapid ventricular response [facility]) [[id number]]  Admitting Physician: Shela Leff [[id number]]  Attending Physician: Shela Leff [[id number]]  Certification:: I certify this patient will need inpatient services for at least 2 midnights  Estimated Length of Stay: 2          B Medical/Surgery History Past Medical History:  Diagnosis Date   Blood transfusion    Breast cancer, left ([place]) 09/26/2011   Punch biopsies of the left nipple done on 09/16/2011.  ER and PR were negative.  HER2 is positive with ratio 4.68.Had mastectomy on 10/25/11. Path T1,N0.    Chronic low back pain    Dyslipidemia    GERD (gastroesophageal reflux disease)    HTN (hypertension)    Osteoarthritis    Osteoporosis    Past Surgical History:  Procedure Laterality Date   BACK SURGERY  2009   lumb lam   BREAST SURGERY  2009   lt lumpectomy-plus 2 re-excisions for margins  left breast mastectomy  2013   MASTECTOMY Left    ROTATOR CUFF REPAIR     Bilateral, 2010   TOTAL ABDOMINAL HYSTERECTOMY       A IV Location/Drains/Wounds Patient Lines/Drains/Airways Status     Active Line/Drains/Airways     Name Placement date Placement time Site Days   Peripheral IV 10/30/21 22 G 1" Distal;Left;Posterior Forearm 10/30/21  --  Forearm  1   Peripheral IV 10/30/21 20 G 1" Right Antecubital 10/30/21  --  Antecubital  1    Closed System Drain 1 Left Breast Bulb (JP) 19 Fr. 10/25/11  1140  Breast  3659   Urethral Catheter [facility] RN Latex 14 Fr. 10/11/21  1730  Latex  20   Incision 10/25/11 Breast Left 10/25/11  1206  -- 3659            Intake/Output Last 24 hours No intake or output data in the 24 hours ending 10/31/21 1508  Labs/Imaging Results for orders placed or performed during the hospital encounter of 10/30/21 (from the past 48 hour(s))  Comprehensive metabolic panel     Status: Abnormal   Collection Time: 10/30/21 10:46 PM  Result Value Ref Range   Sodium 141 135 - 145 mmol/L   Potassium 3.6 3.5 - 5.1 mmol/L   Chloride 107 98 - 111 mmol/L   CO2 24 22 - 32 mmol/L   Glucose, Bld 91 70 - 99 mg/dL    Comment: Glucose reference range applies only to samples taken after fasting for at least 8 hours.   BUN 27 (H) 8 - 23 mg/dL   Creatinine, Ser 0.92 0.44 - 1.00 mg/dL   Calcium 8.7 (L) 8.9 - 10.3 mg/dL   Total Protein 5.7 (L) 6.5 - 8.1 g/dL   Albumin 2.4 (L) 3.5 - 5.0 g/dL   AST 33 15 - 41 U/L   ALT 20 0 - 44 U/L   Alkaline Phosphatase 111 38 - 126 U/L   Total Bilirubin 1.3 (H) 0.3 - 1.2 mg/dL   GFR, Estimated 60 (L) >60 mL/min    Comment: (NOTE) Calculated using the CKD-EPI Creatinine Equation (2021)    Anion gap 10 5 - 15    Comment: Performed at [facility], [address] [place], [place] [address]  CBC     Status: Abnormal   Collection Time: 10/30/21 10:46 PM  Result Value Ref Range   WBC 11.3 (H) 4.0 - 10.5 K/uL   RBC 3.22 (L) 3.87 - 5.11 MIL/uL   Hemoglobin 9.8 (L) 12.0 - 15.0 g/dL   HCT 30.5 (L) 36.0 - 46.0 %   MCV 94.7 80.0 - 100.0 fL   MCH 30.4 26.0 - 34.0 pg   MCHC 32.1 30.0 - 36.0 g/dL   RDW 18.8 (H) 11.5 - 15.5 %   Platelets 440 (H) 150 - 400 K/uL   nRBC 0.2 0.0 - 0.2 %    Comment: Performed at [facility] [address] [place], [place] [address]  Ethanol     Status: None   Collection Time: 10/30/21 10:46 PM  Result Value Ref Range   Alcohol,  Ethyl (B) <10 <10 mg/dL    Comment: (NOTE) Lowest detectable limit for serum alcohol is 10 mg/dL.  For medical purposes only. Performed at [facility], [place] [address], [place], [place] [address]   Protime-INR     Status: Abnormal   Collection Time: 10/30/21 10:46 PM  Result Value Ref Range  Prothrombin Time 16.7 (H) 11.4 - 15.2 seconds   INR 1.4 (H) 0.8 - 1.2    Comment: (NOTE) INR goal varies based on device and disease states. Performed at [facility], [place] [address] [place], [place] [address]   CK total and CKMB     Status: Abnormal   Collection Time: 10/30/21 10:46 PM  Result Value Ref Range   Total CK 81 38 - 234 U/L   CK, MB 6.1 (H) 0.5 - 5.0 ng/mL   Relative Index RELATIVE INDEX IS INVALID 0.0 - 2.5    Comment: WHEN CK < 100 U/L        Performed at [facility] [address] [place], [place] [address]   Sample to Blood Bank     Status: None   Collection Time: 10/30/21 10:52 PM  Result Value Ref Range   Blood Bank Specimen SAMPLE AVAILABLE FOR TESTING    Sample Expiration      10/31/2021,2359 Performed at [facility], [place] [address] [place], [place] [address]   Resp Panel by RT-PCR (Flu A&B, Covid) Anterior Nasal Swab     Status: None   Collection Time: 10/30/21 10:58 PM   Specimen: Anterior Nasal Swab  Result Value Ref Range   SARS Coronavirus 2 by RT PCR NEGATIVE NEGATIVE    Comment: (NOTE) SARS-CoV-2 target nucleic acids are NOT DETECTED.  The SARS-CoV-2 RNA is generally detectable in upper respiratory specimens during the acute phase of infection. The lowest concentration of SARS-CoV-2 viral copies this assay can detect is 138 copies/mL. A negative result does not preclude SARS-Cov-2 infection and should not be used as the sole basis for treatment or other patient management decisions. A negative result may occur with  improper specimen collection/handling, submission of specimen other than nasopharyngeal swab, presence of  viral mutation(s) within the areas targeted by this assay, and inadequate number of viral copies(<138 copies/mL). A negative result must be combined with clinical observations, patient history, and epidemiological information. The expected result is Negative.  Fact Sheet for Patients:  EntrepreneurPulse.com.au  Fact Sheet for Healthcare Providers:  IncredibleEmployment.be  This test is no t yet approved or cleared by the [place] FDA and  has been authorized for detection and/or diagnosis of SARS-CoV-2 by FDA under an Emergency Use Authorization (EUA). This EUA will remain  in effect (meaning this test can be used) for the duration of the COVID-19 declaration under Section 564(b)(1) of the Act, 21 U.S.C.section 360bbb-3(b)(1), unless the authorization is terminated  or revoked sooner.       Influenza A by PCR NEGATIVE NEGATIVE   Influenza B by PCR NEGATIVE NEGATIVE    Comment: (NOTE) The Xpert Xpress SARS-CoV-2/FLU/RSV plus assay is intended as an aid in the diagnosis of influenza from Nasopharyngeal swab specimens and should not be used as a sole basis for treatment. Nasal washings and aspirates are unacceptable for Xpert Xpress SARS-CoV-2/FLU/RSV testing.  Fact Sheet for Patients: EntrepreneurPulse.com.au  Fact Sheet for Healthcare Providers: IncredibleEmployment.be  This test is not yet approved or cleared by the [place] FDA and has been authorized for detection and/or diagnosis of SARS-CoV-2 by FDA under an Emergency Use Authorization (EUA). This EUA will remain in effect (meaning this test can be used) for the duration of the COVID-19 declaration under Section 564(b)(1) of the Act, 21 U.S.C. section 360bbb-3(b)(1), unless the authorization is terminated or revoked.  Performed at [facility], [place] [address], [place], [place] [address]  I-Stat Chem 8, ED     Status: Abnormal    Collection Time: 10/30/21 11:08 PM  Result Value Ref Range   Sodium 141 135 - 145 mmol/L   Potassium 3.8 3.5 - 5.1 mmol/L   Chloride 106 98 - 111 mmol/L   BUN 34 (H) 8 - 23 mg/dL   Creatinine, Ser 0.70 0.44 - 1.00 mg/dL   Glucose, Bld 85 70 - 99 mg/dL    Comment: Glucose reference range applies only to samples taken after fasting for at least 8 hours.   Calcium, Ion 1.12 (L) 1.15 - 1.40 mmol/L   TCO2 25 22 - 32 mmol/L   Hemoglobin 11.2 (L) 12.0 - 15.0 g/dL   HCT 33.0 (L) 36.0 - 46.0 %  Urinalysis, Routine w reflex microscopic Urine, In & Out Cath     Status: Abnormal   Collection Time: 10/31/21  1:32 AM  Result Value Ref Range   Color, Urine AMBER (A) YELLOW    Comment: BIOCHEMICALS MAY BE AFFECTED BY COLOR   APPearance CLEAR CLEAR   Specific Gravity, Urine 1.032 (H) 1.005 - 1.030   pH 5.0 5.0 - 8.0   Glucose, UA NEGATIVE NEGATIVE mg/dL   Hgb urine dipstick NEGATIVE NEGATIVE   Bilirubin Urine NEGATIVE NEGATIVE   Ketones, ur NEGATIVE NEGATIVE mg/dL   Protein, ur NEGATIVE NEGATIVE mg/dL   Nitrite NEGATIVE NEGATIVE   Leukocytes,Ua NEGATIVE NEGATIVE    Comment: Performed at [facility], [address], [place], [place] [address]  Strep pneumoniae urinary antigen     Status: None   Collection Time: 10/31/21  1:32 AM  Result Value Ref Range   Strep Pneumo Urinary Antigen NEGATIVE NEGATIVE    Comment:        Infection due to S. pneumoniae cannot be absolutely ruled out since the antigen present may be below the detection limit of the test. Performed at [facility], [place] [address], [place], [place] [address]   Lactic acid, plasma     Status: Abnormal   Collection Time: 10/31/21  4:35 AM  Result Value Ref Range   Lactic Acid, Venous 2.3 (HH) 0.5 - 1.9 mmol/L    Comment: CRITICAL RESULT CALLED TO, READ BACK BY AND VERIFIED WITH B.OLDLAND, RN [phone] 09.17.23 MRIVET Performed at [facility], [place] [address] [place], [place] [address]   CBC     Status: Abnormal    Collection Time: 10/31/21  4:35 AM  Result Value Ref Range   WBC 10.9 (H) 4.0 - 10.5 K/uL   RBC 3.22 (L) 3.87 - 5.11 MIL/uL   Hemoglobin 9.7 (L) 12.0 - 15.0 g/dL   HCT 32.6 (L) 36.0 - 46.0 %   MCV 101.2 (H) 80.0 - 100.0 fL    Comment: REPEATED TO VERIFY   MCH 30.1 26.0 - 34.0 pg   MCHC 29.8 (L) 30.0 - 36.0 g/dL   RDW 18.9 (H) 11.5 - 15.5 %   Platelets 394 150 - 400 K/uL   nRBC 0.2 0.0 - 0.2 %    Comment: Performed at [facility], [place] [address] [place], [place] [address]  Basic metabolic panel     Status: Abnormal   Collection Time: 10/31/21  4:35 AM  Result Value Ref Range   Sodium 142 135 - 145 mmol/L   Potassium 3.7 3.5 - 5.1 mmol/L   Chloride 105 98 - 111 mmol/L   CO2 22 22 - 32 mmol/L   Glucose, Bld 93 70 -  99 mg/dL    Comment: Glucose reference range applies only to samples taken after fasting for at least 8 hours.   BUN 26 (H) 8 - 23 mg/dL   Creatinine, Ser 0.78 0.44 - 1.00 mg/dL   Calcium 8.9 8.9 - 10.3 mg/dL   GFR, Estimated >60 >60 mL/min    Comment: (NOTE) Calculated using the CKD-EPI Creatinine Equation (2021)    Anion gap 15 5 - 15    Comment: Performed at [facility] [address], [place], [place] [address]  Magnesium     Status: None   Collection Time: 10/31/21  4:35 AM  Result Value Ref Range   Magnesium 1.9 1.7 - 2.4 mg/dL    Comment: Performed at [facility] [address] [place], [place] [address]  Vitamin B12     Status: Abnormal   Collection Time: 10/31/21  4:35 AM  Result Value Ref Range   Vitamin B-12 1,515 (H) 180 - 914 pg/mL    Comment: (NOTE) This assay is not validated for testing neonatal or myeloproliferative syndrome specimens for Vitamin B12 levels. Performed at [facility], [place] [address], [place], [place] [address]   Ammonia     Status: None   Collection Time: 10/31/21  4:35 AM  Result Value Ref Range   Ammonia 12 9 - 35 umol/L    Comment: Performed at [facility], [place] [address]  [place], [place] [address]  Procalcitonin - Baseline     Status: None   Collection Time: 10/31/21  4:35 AM  Result Value Ref Range   Procalcitonin 0.19 ng/mL    Comment:        Interpretation: PCT (Procalcitonin) <= 0.5 ng/mL: Systemic infection (sepsis) is not likely. Local bacterial infection is possible. (NOTE)       Sepsis PCT Algorithm           Lower Respiratory Tract                                      Infection PCT Algorithm    ----------------------------     ----------------------------         PCT < 0.25 ng/mL                PCT < 0.10 ng/mL          Strongly encourage             Strongly discourage   discontinuation of antibiotics    initiation of antibiotics    ----------------------------     -----------------------------       PCT 0.25 - 0.50 ng/mL            PCT 0.10 - 0.25 ng/mL               OR       >80% decrease in PCT            Discourage initiation of                                            antibiotics      Encourage discontinuation           of antibiotics    ----------------------------     -----------------------------  PCT >= 0.50 ng/mL              PCT 0.26 - 0.50 ng/mL               AND        <80% decrease in PCT             Encourage initiation of                                             antibiotics       Encourage continuation           of antibiotics    ----------------------------     -----------------------------        PCT >= 0.50 ng/mL                  PCT > 0.50 ng/mL               AND         increase in PCT                  Strongly encourage                                      initiation of antibiotics    Strongly encourage escalation           of antibiotics                                     -----------------------------                                           PCT <= 0.25 ng/mL                                                 OR                                        > 80% decrease in PCT                                       Discontinue / Do not initiate                                             antibiotics  Performed at [facility], [address], [place], [place] [address]   Brain natriuretic peptide     Status: Abnormal   Collection Time: 10/31/21  4:35 AM  Result Value Ref Range   B Natriuretic Peptide 1,157.8 (H) 0.0 - 100.0 pg/mL    Comment: Performed at [place],  [place] [address]  Troponin I (High Sensitivity)     Status: Abnormal   Collection Time: 10/31/21  4:35 AM  Result Value Ref Range   Troponin I (High Sensitivity) 89 (H) <18 ng/L    Comment: (NOTE) Elevated high sensitivity troponin I (hsTnI) values and significant  changes across serial measurements may suggest ACS but many other  chronic and acute conditions are known to elevate hsTnI results.  Refer to the "Links" section for chest pain algorithms and additional  guidance. Performed at [facility], [place] [address], [place], [place] [address]   TSH     Status: Abnormal   Collection Time: 10/31/21  4:35 AM  Result Value Ref Range   TSH 8.147 (H) 0.350 - 4.500 uIU/mL    Comment: Performed by a 3rd Generation assay with a functional sensitivity of <=0.01 uIU/mL. Performed at [facility], [place] [address], [place], [place] [address]   I-Stat arterial blood gas, ED     Status: Abnormal   Collection Time: 10/31/21  4:52 AM  Result Value Ref Range   pH, Arterial 7.464 (H) 7.35 - 7.45   pCO2 arterial 32.4 32 - 48 mmHg   pO2, Arterial 95 83 - 108 mmHg   Bicarbonate 23.5 20.0 - 28.0 mmol/L   TCO2 25 22 - 32 mmol/L   O2 Saturation 98 %   Acid-Base Excess 0.0 0.0 - 2.0 mmol/L   Sodium 140 135 - 145 mmol/L   Potassium 3.5 3.5 - 5.1 mmol/L   Calcium, Ion 1.22 1.15 - 1.40 mmol/L   HCT 28.0 (L) 36.0 - 46.0 %   Hemoglobin 9.5 (L) 12.0 - 15.0 g/dL   Patient temperature 97.0 F    Sample type ARTERIAL   Respiratory (~20 pathogens) panel by PCR     Status: None   Collection Time:  10/31/21 11:20 AM   Specimen: Nasopharyngeal Swab; Respiratory  Result Value Ref Range   Adenovirus NOT DETECTED NOT DETECTED   Coronavirus 229E NOT DETECTED NOT DETECTED    Comment: (NOTE) The Coronavirus on the Respiratory Panel, DOES NOT test for the novel  Coronavirus (2019 nCoV)    Coronavirus HKU1 NOT DETECTED NOT DETECTED   Coronavirus NL63 NOT DETECTED NOT DETECTED   Coronavirus OC43 NOT DETECTED NOT DETECTED   Metapneumovirus NOT DETECTED NOT DETECTED   Rhinovirus / Enterovirus NOT DETECTED NOT DETECTED   Influenza A NOT DETECTED NOT DETECTED   Influenza B NOT DETECTED NOT DETECTED   Parainfluenza Virus 1 NOT DETECTED NOT DETECTED   Parainfluenza Virus 2 NOT DETECTED NOT DETECTED   Parainfluenza Virus 3 NOT DETECTED NOT DETECTED   Parainfluenza Virus 4 NOT DETECTED NOT DETECTED   Respiratory Syncytial Virus NOT DETECTED NOT DETECTED   Bordetella pertussis NOT DETECTED NOT DETECTED   Bordetella Parapertussis NOT DETECTED NOT DETECTED   Chlamydophila pneumoniae NOT DETECTED NOT DETECTED   Mycoplasma pneumoniae NOT DETECTED NOT DETECTED    Comment: Performed at [facility], [address] [place], [place] [address]  APTT     Status: Abnormal   Collection Time: 10/31/21 12:24 PM  Result Value Ref Range   aPTT 108 (H) 24 - 36 seconds    Comment:        IF BASELINE aPTT IS ELEVATED, SUGGEST PATIENT RISK ASSESSMENT BE USED TO DETERMINE APPROPRIATE ANTICOAGULANT THERAPY. Performed at [facility], [place] [address] [place], [place] [address]   Heparin level (unfractionated)     Status: Abnormal  Collection Time: 10/31/21 12:24 PM  Result Value Ref Range   Heparin Unfractionated >1.10 (H) 0.30 - 0.70 IU/mL    Comment: (NOTE) The clinical reportable range upper limit is being lowered to >1.10 to align with the FDA approved guidance for the current laboratory assay.  If heparin results are below expected values, and patient dosage has  been confirmed, suggest  follow up testing of antithrombin III levels. Performed at [facility], [place] [address], [place], [place] [address]    CT CHEST ABDOMEN PELVIS W CONTRAST  Addendum Date: 10/31/2021   ADDENDUM REPORT: 10/31/2021 00:06 ADDENDUM: These results were called by telephone at the time of interpretation on 10/31/2021 at 12:06 am to provider Dr. Matilde Sprang, who verbally acknowledged these results. Electronically Signed   By: Iven Finn M.D.   On: 10/31/2021 00:06   Result Date: 10/31/2021 CLINICAL DATA:  Polytrauma, blunt [id number] EXAM: CT CHEST, ABDOMEN, AND PELVIS WITH CONTRAST TECHNIQUE: Multidetector CT imaging of the chest, abdomen and pelvis was performed following the standard protocol during bolus administration of intravenous contrast. RADIATION DOSE REDUCTION: This exam was performed according to the departmental dose-optimization program which includes automated exposure control, adjustment of the mA and/or kV according to patient size and/or use of iterative reconstruction technique. CONTRAST:  74m OMNIPAQUE IOHEXOL 350 MG/ML SOLN COMPARISON:  CT chest, abdomen, pelvis 10/08/2021 FINDINGS: CHEST: Cardiovascular: No aortic injury. The thoracic aorta is normal in caliber. The heart is normal in size. No significant pericardial effusion. The main pulmonary artery is borderline enlarged in caliber measuring up 3.1 cm. Similar-appearing occlusive right main pulmonary embolus extending to the right middle and right lower lobes. Possible right upper lobe subsegmental pulmonary embolus. Persistent, slightly decreased in size, left upper lobe and left lower lobe segmental and subsegmental pulmonary emboli. Limited evaluation due to timing of contrast. Mediastinum/Nodes: No pneumomediastinum. No mediastinal hematoma. The esophagus is unremarkable. The thyroid is unremarkable. The central airways are patent. No mediastinal, hilar, or axillary lymphadenopathy. Lungs/Pleura: Interval development of a masslike  slightly bubbly right apical airspace opacity. Interval development of a subjacent right upper lobe 7 mm pulmonary nodule (5:29). Interval development of right middle lobe interlobular and ground-glass airspace opacity (5:62). Similar-appearing left upper lobe anterior peripheral reticulations. No pulmonary contusion or laceration. No pneumatocele formation. Interval decrease in size of a small to moderate volume right pleural effusion. Interval increase in size of a trace volume left pleural effusion. No pneumothorax. No hemothorax. Musculoskeletal/Chest wall: Similar-appearing 7.5 x 2.8 cm left breast mass density likely representing an implant. Diffuse subcutaneus soft tissue edema. No acute rib or sternal fracture. Old right posterior ninth and tenth rib fractures. No spinal fracture.  Multilevel degenerative changes spine. Degenerative changes of bilateral shoulders. ABDOMEN / PELVIS: Hepatobiliary: Not enlarged. No focal lesion. No laceration or subcapsular hematoma. Layering hyperdensity within the gallbladder lumen likely gallstones. No gallbladder wall thickening or pericholecystic fluid. No biliary ductal dilatation. Pancreas: Atrophic. Otherwise normal pancreatic contour. No main pancreatic duct dilatation. Spleen: Not enlarged. No focal lesion. No laceration, subcapsular hematoma, or vascular injury. Adrenals/Urinary Tract: No nodularity bilaterally. Bilateral kidneys enhance symmetrically. 5 mm calcification within the left kidney. 2 mm calcification within the right kidney. No ureterolithiasis. No hydronephrosis. No contusion, laceration, or subcapsular hematoma. No injury to the vascular structures or collecting systems. No hydroureter. The urinary bladder is unremarkable. Stomach/Bowel: No small or large bowel wall thickening or dilatation. Colonic diverticulosis. The appendix is not definitely identified with no inflammatory changes in the right  lower quadrant to suggest acute appendicitis.  Vasculature/Lymphatics: Atherosclerotic plaque. No abdominal aorta or iliac aneurysm. No active contrast extravasation or pseudoaneurysm. No abdominal, pelvic, inguinal lymphadenopathy. Reproductive: Status post hysterectomy. No bilateral adnexal masses. Other: Nonspecific trace free fluid within the pelvis. No pneumoperitoneum. No hemoperitoneum. No mesenteric hematoma identified. No organized fluid collection. Musculoskeletal: Left hip 7.5 x 7.2 x at least 8.4 cm hyperdense subcutaneus soft tissue lesion likely representing a hematoma. Diffuse subcutaneus soft tissue edema. No acute pelvic fracture. No spinal fracture. L5-S1 posterolateral fusion surgical hardware. Multilevel degenerative changes of the spine. Ports and Devices: None. IMPRESSION: 1. Similar-appearing occlusive right main pulmonary embolus extending to the right middle and right lower lobes. Persistent, slightly decreased in size, left upper lobe and left lower lobe segmental and subsegmental pulmonary emboli. Possible right upper lobe subsegmental pulmonary embolus. Limited evaluation due to timing of contrast. Persistent enlarged right atrium with possible underlying right heart strain. 2. Interval development of a masslike right apical airspace opacity with a subjacent 7 mm pulmonary nodule. Finding may represent infection/inflammation. Pulmonary infarction not excluded. Recommend follow-up CT in 3 months to evaluate for resolution. 3. Partially visualized at least 7.5 x 7.2 x 8.4 cm left hip subcutaneus soft tissue hematoma. 4. No acute traumatic injury to the chest, abdomen, or pelvis. 5. No acute fracture or traumatic malalignment of the thoracic or lumbar spine. Other imaging findings of potential clinical significance: 1. Interval decrease in size of a small to moderate volume right pleural effusion. 2. Interval increase in size of a trace volume left pleural effusion. 3. Similar-appearing 7.5 x 2.8 cm left breast mass density likely  representing an implant. Recommend correlation with clinical history. 4. Nonobstructive bilateral nephrolithiasis. 5. Cholelithiasis with no acute cholecystitis. 6. Colonic diverticulosis with no acute diverticulitis. 7. Anasarca. Electronically Signed: By: Iven Finn M.D. On: 10/31/2021 00:01   CT HEAD WO CONTRAST  Result Date: 10/30/2021 CLINICAL DATA:  Head trauma, moderate-severe; Polytrauma, blunt EXAM: CT HEAD WITHOUT CONTRAST CT CERVICAL SPINE WITHOUT CONTRAST TECHNIQUE: Multidetector CT imaging of the head and cervical spine was performed following the standard protocol without intravenous contrast. Multiplanar CT image reconstructions of the cervical spine were also generated. RADIATION DOSE REDUCTION: This exam was performed according to the departmental dose-optimization program which includes automated exposure control, adjustment of the mA and/or kV according to patient size and/or use of iterative reconstruction technique. COMPARISON:  None Available. FINDINGS: CT HEAD FINDINGS BRAIN: BRAIN Cerebral ventricle sizes are concordant with the degree of cerebral volume loss. Patchy and confluent areas of decreased attenuation are noted throughout the deep and periventricular white matter of the cerebral hemispheres bilaterally, compatible with chronic microvascular ischemic disease. No evidence of large-territorial acute infarction. No parenchymal hemorrhage. No mass lesion. No extra-axial collection. No mass effect or midline shift. No hydrocephalus. Basilar cisterns are patent. Vascular: No hyperdense vessel. Atherosclerotic calcifications are present within the cavernous internal carotid and vertebral arteries. Skull: No acute fracture or focal lesion. Sinuses/Orbits: Paranasal sinuses and mastoid air cells are clear. Bilateral lens replacement. Otherwise the orbits are unremarkable. Other: Left parieto-occipital 6 mm scalp hematoma formation. CT CERVICAL SPINE FINDINGS Alignment: Straightening  of the normal cervical lordosis likely due to positioning and degenerative changes. Grade 1 anterolisthesis of C3 on C4, C4 on C5, C7 on T1. Skull base and vertebrae: Multilevel severe degenerative changes of the spine. No severe osseous neural foraminal or central canal stenosis. Severe multilevel intervertebral disc space narrowing. No acute fracture. No aggressive appearing focal osseous  lesion or focal pathologic process. Soft tissues and spinal canal: No prevertebral fluid or swelling. No visible canal hematoma. Upper chest: Right upper lobe airspace opacity. Please see CT chest, abdomen, pelvis 10/30/2021. Other: None. IMPRESSION: 1. No acute intracranial abnormality. 2. No acute displaced fracture or traumatic listhesis of the cervical spine. Electronically Signed   By: Iven Finn M.D.   On: 10/30/2021 23:32   CT CERVICAL SPINE WO CONTRAST  Result Date: 10/30/2021 CLINICAL DATA:  Head trauma, moderate-severe; Polytrauma, blunt EXAM: CT HEAD WITHOUT CONTRAST CT CERVICAL SPINE WITHOUT CONTRAST TECHNIQUE: Multidetector CT imaging of the head and cervical spine was performed following the standard protocol without intravenous contrast. Multiplanar CT image reconstructions of the cervical spine were also generated. RADIATION DOSE REDUCTION: This exam was performed according to the departmental dose-optimization program which includes automated exposure control, adjustment of the mA and/or kV according to patient size and/or use of iterative reconstruction technique. COMPARISON:  None Available. FINDINGS: CT HEAD FINDINGS BRAIN: BRAIN Cerebral ventricle sizes are concordant with the degree of cerebral volume loss. Patchy and confluent areas of decreased attenuation are noted throughout the deep and periventricular white matter of the cerebral hemispheres bilaterally, compatible with chronic microvascular ischemic disease. No evidence of large-territorial acute infarction. No parenchymal hemorrhage. No mass  lesion. No extra-axial collection. No mass effect or midline shift. No hydrocephalus. Basilar cisterns are patent. Vascular: No hyperdense vessel. Atherosclerotic calcifications are present within the cavernous internal carotid and vertebral arteries. Skull: No acute fracture or focal lesion. Sinuses/Orbits: Paranasal sinuses and mastoid air cells are clear. Bilateral lens replacement. Otherwise the orbits are unremarkable. Other: Left parieto-occipital 6 mm scalp hematoma formation. CT CERVICAL SPINE FINDINGS Alignment: Straightening of the normal cervical lordosis likely due to positioning and degenerative changes. Grade 1 anterolisthesis of C3 on C4, C4 on C5, C7 on T1. Skull base and vertebrae: Multilevel severe degenerative changes of the spine. No severe osseous neural foraminal or central canal stenosis. Severe multilevel intervertebral disc space narrowing. No acute fracture. No aggressive appearing focal osseous lesion or focal pathologic process. Soft tissues and spinal canal: No prevertebral fluid or swelling. No visible canal hematoma. Upper chest: Right upper lobe airspace opacity. Please see CT chest, abdomen, pelvis 10/30/2021. Other: None. IMPRESSION: 1. No acute intracranial abnormality. 2. No acute displaced fracture or traumatic listhesis of the cervical spine. Electronically Signed   By: Iven Finn M.D.   On: 10/30/2021 23:32   DG Knee Left Port  Result Date: 10/30/2021 CLINICAL DATA:  Blunt trauma, fall. EXAM: PORTABLE LEFT KNEE - 1-2 VIEW COMPARISON:  None Available. FINDINGS: No evidence of fracture, dislocation, or joint effusion. Mild-to-moderate tricompartmental degenerative changes. Chondrocalcinosis is noted. Vascular calcifications are present in the soft tissues. IMPRESSION: 1. No acute fracture or dislocation. 2. Mild-to-moderate tricompartmental degenerative changes. 3. Chondrocalcinosis. Electronically Signed   By: Brett Fairy M.D.   On: 10/30/2021 23:10   DG Chest Port  1 View  Result Date: 10/30/2021 CLINICAL DATA:  Trauma. EXAM: PORTABLE CHEST 1 VIEW COMPARISON:  Chest x-ray 10/08/2021.  CT of the chest 10/08/2021. FINDINGS: Aorta is ectatic, unchanged. The heart is enlarged similar to the prior study. Small right pleural effusion is partially loculated. There are minimal strandy opacities at the left lung base favored is atelectasis. No evidence for pneumothorax. No acute fractures are seen. IMPRESSION: Small right pleural effusion, partially loculated. Stable cardiomegaly. Electronically Signed   By: Ronney Asters M.D.   On: 10/30/2021 23:07   DG Pelvis Portable  Result Date: 10/30/2021 CLINICAL DATA:  Trauma. EXAM: PORTABLE PELVIS 1-2 VIEWS COMPARISON:  Pelvis x-ray 10/08/2021 FINDINGS: Patient's hand overlies the right pelvis. The bones are osteopenic. There is no definite acute fracture or dislocation. Joint spaces are maintained. L5-S1 fusion hardware is visualized. Peripheral vascular calcifications are present. IMPRESSION: 1. Limited evaluation of the right iliac bone secondary to overlying artifact. No definite acute fracture or dislocation. Electronically Signed   By: Ronney Asters M.D.   On: 10/30/2021 23:06    Pending Labs Unresulted Labs (From admission, onward)     Start     Ordered   11/01/21 0500  APTT  Daily at 5am,   R      10/31/21 0242   11/01/21 0500  Heparin level (unfractionated)  Daily at 5am,   R      10/31/21 0242   10/31/21 1120  Legionella Pneumophila Serogp 1 Ur Ag  Once,   R        10/31/21 1119   10/31/21 0714  MRSA Next Gen by PCR, Nasal  (MRSA Screening)  Once,   R        10/31/21 0713   10/31/21 0500  CBC  Daily at 5am,   R      10/31/21 0242   10/31/21 0415  Blood gas, arterial  ONCE - STAT,   STAT        10/31/21 0414            Vitals/Pain Today's Vitals   10/31/21 1245 10/31/21 1345 10/31/21 1414 10/31/21 1430  BP: (!) 139/102 (!) 151/90  (!) 131/104  Pulse: (!) 106 (!) 106  (!) 102  Resp: 18 (!) 25  18   Temp:   (!) 97.2 F (36.2 C)   TempSrc:   Axillary   SpO2: 96% 96%  97%  Weight:      Height:        Isolation Precautions No active isolations  Medications Medications  diltiazem (CARDIZEM) 1 mg/mL load via infusion 10 mg (10 mg Intravenous Bolus from Bag 10/31/21 0241)    And  diltiazem (CARDIZEM) 125 mg in dextrose 5% 125 mL (1 mg/mL) infusion (10 mg/hr Intravenous Rate/Dose Verify 10/31/21 1414)  heparin ADULT infusion 100 units/mL (25000 units/221m) (850 Units/hr Intravenous Rate/Dose Change 10/31/21 1415)  azithromycin (ZITHROMAX) 500 mg in sodium chloride 0.9 % 250 mL IVPB (has no administration in time range)  cefTRIAXone (ROCEPHIN) 2 g in sodium chloride 0.9 % 100 mL IVPB (has no administration in time range)  LORazepam (ATIVAN) injection 1 mg (1 mg Intravenous Given 10/30/21 2256)  iohexol (OMNIPAQUE) 350 MG/ML injection 75 mL (75 mLs Intravenous Contrast Given 10/30/21 2326)  haloperidol lactate (HALDOL) injection 2 mg (2 mg Intravenous Given 10/31/21 0156)  cefTRIAXone (ROCEPHIN) 1 g in sodium chloride 0.9 % 100 mL IVPB (0 g Intravenous Stopped 10/31/21 0246)  azithromycin (ZITHROMAX) 500 mg in sodium chloride 0.9 % 250 mL IVPB (0 mg Intravenous Stopped 10/31/21 0358)  potassium chloride 10 mEq in 100 mL IVPB (0 mEq Intravenous Stopped 10/31/21 0907)  morphine (PF) 2 MG/ML injection 1 mg (1 mg Intravenous Given 10/31/21 1024)    Mobility non-ambulatory High fall risk     R Recommendations: See Admitting Provider Note  Report given to:   Additional Notes: Hospice pt from SNF, family at bedside. Negative vocalizations of pain.

## 2021-10-31 NOTE — ED Notes (Signed)
Facility updated by this RN.

## 2021-10-31 NOTE — Discharge Summary (Signed)
Physician Discharge Summary   Patient: Judy [name] MRN: [id number] DOB: 07-20-1930  Admit date:     10/30/2021  Discharge date: 10/31/21  Discharge Physician: Kaydie Friendly   PCP: Pcp, No   Recommendations at discharge:   [facility]  Discharge Diagnoses: Principal Problem:   Atrial flutter with rapid ventricular response (Judy [name]) Active Problems:   Pulmonary emboli (HCC)   Acute metabolic encephalopathy   Hematoma   CHF (congestive heart failure) Judy [name])    Hospital Course: Judy [name] is a 88 y.o. female with medical history significant of dementia, paroxysmal A-fib, chronic systolic CHF (EF 40 to 01% per echo done 07/2021), breast cancer status post left breast mastectomy, chronic low back pain, hyperlipidemia, GERD, hypertension, osteoporosis, osteoarthritis.  She was admitted to the hospital last month 8/7-8/10 for large central and subsegmental bilateral PE and was not a candidate for thrombolysis.  Work-up also revealed right lower extremity DVT.  Patient had declined echocardiogram.  She was discharged on Eliquis. Readmitted 8/25-8/29 after a fall at her facility and found to be in A-fib with RVR.  Hospital course complicated by encephalopathy with agitation and periods of lethargy.  Foley catheter was placed for urinary retention.  She was also treated for a UTI.  She had a moderate right pleural effusion in setting of history of breast cancer, family decided not to pursue thoracentesis.  Palliative care was consulted and she was discharged to her facility with hospice care. Pt returns to the ED after a fall at her facility. On EMS arrival she was screaming, moving all extremities, and not following any commands.  She had a scalp hematoma.  Patient noted to be in A-fib with RVR with rate in the 120s to 160s. COVID and influenza PCR negative, UA without signs of infection.  Pelvic x-ray, left knee x-ray, and CT head/C-spine negative for acute traumatic injuries.  Patient admitted  for further management.  PCCM consulted.    Today, unable to ROS with patient due to significant lethargy, encephalopathy, appears to be in pain, constantly moaning when touched.  Patient not following commands.  Given the very poor prognosis, recurrent hospitalization, multiple comorbidities including a large PE on anticoagulation with multiple recurrent falls, discussed extensively with family and the hospice team, family ultimately agreed to discharge present to [facility] place for comfort care  Assessment and Plan:  Paroxysmal A-fib with RVR Rate uncontrolled   PE with large clot burden Acute hypoxemic respiratory failure CTA done showed similar appearing occlusive right main pulmonary embolus extending to the right middle and right lower lobes PCCM consulted Discontinue heparin drip   Possible CAP CT showing interval development of a masslike right apical airspace opacity at the subjacent 7 mm pulmonary nodule. ?Infection Procalcitonin 0.19 No blood cultures drawn in the ED Discontinue ceftriaxone and azithromycin   Left hip hematoma Presented after a fall at her facility CT showing a partially visualized at least 7.5 x 7.2 x 8.4 cm left hip subcutaneus soft tissue hematoma Hemoglobin stable since recent hospital discharge   Acute metabolic encephalopathy Likely multifactorial, by problems listed above, no obvious focal neurologic deficits noted UA without signs of infection CT head negative for acute finding B12, ammonia both WNL TSH elevated at 8.147, free T4 pending   Chronic systolic CHF (EF 40 to 60% per echo done 07/2021) BNP 1,157 Has peripheral edema CT showing interval increase in size of trace volume left pleural effusion Family declined thoracentesis, high risk with patient on heparin with PE  only noted to be trace amount   Normocytic anemia Hemoglobin currently stable   QT prolongation   Goals of care discussion Discussed with hospice, patient's  daughter-in-law, about multiple comorbidities and very poor prognosis of patient      Consultants: PCCM Procedures performed: None Disposition: Hospice care Diet recommendation:  Regular diet as tolerated   DISCHARGE MEDICATION: Allergies as of 10/31/2021       Reactions   Amoxil [amoxicillin] Nausea Only   NOT ON MAR   Codeine Nausea Only   NOT ON MAR   Aricept [donepezil Hcl] Other (See Comments)   "Felt drunk" Headaches Drowsiness NOT ON MAR   Catapres [clonidine] Other (See Comments)   Dry mouth NOT ON MAR   Hycodan [hydrocodone Bit-homatrop Mbr] Diarrhea, Nausea And Vomiting   NOT ON MAR   Norco [hydrocodone-acetaminophen] Diarrhea, Nausea And Vomiting   Norvasc [amlodipine] Swelling   NOT ON MAR   Prolia [denosumab] Other (See Comments)   Unknown reaction NOT ON MAR   Ultram [tramadol] Nausea Only   NOT ON MAR   Zoloft [sertraline] Nausea Only   NOT ON MAR   Clavulanic Acid Hives, Rash   NOT ON MAR        Medication List     STOP taking these medications    atorvastatin 10 MG tablet Commonly known as: LIPITOR   Cartia XT 240 MG 24 hr capsule Generic drug: diltiazem   Eliquis 5 MG Tabs tablet Generic drug: apixaban   guaifenesin 100 MG/5ML syrup Commonly known as: ROBITUSSIN   metoprolol tartrate 100 MG tablet Commonly known as: LOPRESSOR   mirtazapine 7.5 MG tablet Commonly known as: REMERON   neomycin-bacitracin-polymyxin [phone] ointment   OMEGA-3 PO   QUEtiapine 25 MG tablet Commonly known as: SEROQUEL   saccharomyces boulardii 250 MG capsule Commonly known as: FLORASTOR       TAKE these medications    acetaminophen 500 MG tablet Commonly known as: TYLENOL Take 1,000 mg by mouth every 12 (twelve) hours. May also give 1 tablet every 6 hours as needed for fever of 99.5 to 101 F, minor headache or discomfort   aluminum-magnesium hydroxide-simethicone 200-200-20 MG/5ML Susp Commonly known as: MAALOX Take 30 mLs by mouth  every 6 (six) hours as needed (for constipation).   loperamide 2 MG capsule Commonly known as: IMODIUM Take 2 mg by mouth daily as needed for diarrhea or loose stools.   LORazepam 0.5 MG tablet Commonly known as: ATIVAN Take 0.5 mg by mouth 2 (two) times daily. May also give 1 tablet every 6 hours as needed for anxiety or agitation   magnesium hydroxide 400 MG/5ML suspension Commonly known as: MILK OF MAGNESIA Take 30 mLs by mouth at bedtime as needed for mild constipation.   polyethylene glycol powder 17 GM/SCOOP powder Commonly known as: GLYCOLAX/MIRALAX Take 1 capful (17 g) with water by mouth daily.   traMADol 50 MG tablet Commonly known as: ULTRAM Take 50 mg by mouth 3 (three) times daily.        Discharge Exam: Filed Weights   10/30/21 2255  Weight: 56.5 kg    General: Appears uncomfortable, moaning, unable to communicate, lethargic, encephalopathic Cardiovascular: S1, S2 present Respiratory: CTAB Abdomen: Soft, nontender, nondistended, bowel sounds present Musculoskeletal: 2+ bilateral pedal edema noted Skin: Noted some erythema on bilateral lower extremity Psychiatry: Unable to assess   Condition at discharge: poor  The results of significant diagnostics from this hospitalization (including imaging, microbiology, ancillary and laboratory) are listed below for  reference.   Imaging Studies: CT CHEST ABDOMEN PELVIS W CONTRAST  Addendum Date: 10/31/2021   ADDENDUM REPORT: 10/31/2021 00:06 ADDENDUM: These results were called by telephone at the time of interpretation on 10/31/2021 at 12:06 am to provider Dr. Matilde Sprang, who verbally acknowledged these results. Electronically Signed   By: Iven Finn M.D.   On: 10/31/2021 00:06   Result Date: 10/31/2021 CLINICAL DATA:  Polytrauma, blunt [id number] EXAM: CT CHEST, ABDOMEN, AND PELVIS WITH CONTRAST TECHNIQUE: Multidetector CT imaging of the chest, abdomen and pelvis was performed following the standard protocol during  bolus administration of intravenous contrast. RADIATION DOSE REDUCTION: This exam was performed according to the departmental dose-optimization program which includes automated exposure control, adjustment of the mA and/or kV according to patient size and/or use of iterative reconstruction technique. CONTRAST:  33m OMNIPAQUE IOHEXOL 350 MG/ML SOLN COMPARISON:  CT chest, abdomen, pelvis 10/08/2021 FINDINGS: CHEST: Cardiovascular: No aortic injury. The thoracic aorta is normal in caliber. The heart is normal in size. No significant pericardial effusion. The main pulmonary artery is borderline enlarged in caliber measuring up 3.1 cm. Similar-appearing occlusive right main pulmonary embolus extending to the right middle and right lower lobes. Possible right upper lobe subsegmental pulmonary embolus. Persistent, slightly decreased in size, left upper lobe and left lower lobe segmental and subsegmental pulmonary emboli. Limited evaluation due to timing of contrast. Mediastinum/Nodes: No pneumomediastinum. No mediastinal hematoma. The esophagus is unremarkable. The thyroid is unremarkable. The central airways are patent. No mediastinal, hilar, or axillary lymphadenopathy. Lungs/Pleura: Interval development of a masslike slightly bubbly right apical airspace opacity. Interval development of a subjacent right upper lobe 7 mm pulmonary nodule (5:29). Interval development of right middle lobe interlobular and ground-glass airspace opacity (5:62). Similar-appearing left upper lobe anterior peripheral reticulations. No pulmonary contusion or laceration. No pneumatocele formation. Interval decrease in size of a small to moderate volume right pleural effusion. Interval increase in size of a trace volume left pleural effusion. No pneumothorax. No hemothorax. Musculoskeletal/Chest wall: Similar-appearing 7.5 x 2.8 cm left breast mass density likely representing an implant. Diffuse subcutaneus soft tissue edema. No acute rib or  sternal fracture. Old right posterior ninth and tenth rib fractures. No spinal fracture.  Multilevel degenerative changes spine. Degenerative changes of bilateral shoulders. ABDOMEN / PELVIS: Hepatobiliary: Not enlarged. No focal lesion. No laceration or subcapsular hematoma. Layering hyperdensity within the gallbladder lumen likely gallstones. No gallbladder wall thickening or pericholecystic fluid. No biliary ductal dilatation. Pancreas: Atrophic. Otherwise normal pancreatic contour. No main pancreatic duct dilatation. Spleen: Not enlarged. No focal lesion. No laceration, subcapsular hematoma, or vascular injury. Adrenals/Urinary Tract: No nodularity bilaterally. Bilateral kidneys enhance symmetrically. 5 mm calcification within the left kidney. 2 mm calcification within the right kidney. No ureterolithiasis. No hydronephrosis. No contusion, laceration, or subcapsular hematoma. No injury to the vascular structures or collecting systems. No hydroureter. The urinary bladder is unremarkable. Stomach/Bowel: No small or large bowel wall thickening or dilatation. Colonic diverticulosis. The appendix is not definitely identified with no inflammatory changes in the right lower quadrant to suggest acute appendicitis. Vasculature/Lymphatics: Atherosclerotic plaque. No abdominal aorta or iliac aneurysm. No active contrast extravasation or pseudoaneurysm. No abdominal, pelvic, inguinal lymphadenopathy. Reproductive: Status post hysterectomy. No bilateral adnexal masses. Other: Nonspecific trace free fluid within the pelvis. No pneumoperitoneum. No hemoperitoneum. No mesenteric hematoma identified. No organized fluid collection. Musculoskeletal: Left hip 7.5 x 7.2 x at least 8.4 cm hyperdense subcutaneus soft tissue lesion likely representing a hematoma. Diffuse subcutaneus soft tissue edema. No acute pelvic  fracture. No spinal fracture. L5-S1 posterolateral fusion surgical hardware. Multilevel degenerative changes of the  spine. Ports and Devices: None. IMPRESSION: 1. Similar-appearing occlusive right main pulmonary embolus extending to the right middle and right lower lobes. Persistent, slightly decreased in size, left upper lobe and left lower lobe segmental and subsegmental pulmonary emboli. Possible right upper lobe subsegmental pulmonary embolus. Limited evaluation due to timing of contrast. Persistent enlarged right atrium with possible underlying right heart strain. 2. Interval development of a masslike right apical airspace opacity with a subjacent 7 mm pulmonary nodule. Finding may represent infection/inflammation. Pulmonary infarction not excluded. Recommend follow-up CT in 3 months to evaluate for resolution. 3. Partially visualized at least 7.5 x 7.2 x 8.4 cm left hip subcutaneus soft tissue hematoma. 4. No acute traumatic injury to the chest, abdomen, or pelvis. 5. No acute fracture or traumatic malalignment of the thoracic or lumbar spine. Other imaging findings of potential clinical significance: 1. Interval decrease in size of a small to moderate volume right pleural effusion. 2. Interval increase in size of a trace volume left pleural effusion. 3. Similar-appearing 7.5 x 2.8 cm left breast mass density likely representing an implant. Recommend correlation with clinical history. 4. Nonobstructive bilateral nephrolithiasis. 5. Cholelithiasis with no acute cholecystitis. 6. Colonic diverticulosis with no acute diverticulitis. 7. Anasarca. Electronically Signed: By: Iven Finn M.D. On: 10/31/2021 00:01   CT HEAD WO CONTRAST  Result Date: 10/30/2021 CLINICAL DATA:  Head trauma, moderate-severe; Polytrauma, blunt EXAM: CT HEAD WITHOUT CONTRAST CT CERVICAL SPINE WITHOUT CONTRAST TECHNIQUE: Multidetector CT imaging of the head and cervical spine was performed following the standard protocol without intravenous contrast. Multiplanar CT image reconstructions of the cervical spine were also generated. RADIATION DOSE  REDUCTION: This exam was performed according to the departmental dose-optimization program which includes automated exposure control, adjustment of the mA and/or kV according to patient size and/or use of iterative reconstruction technique. COMPARISON:  None Available. FINDINGS: CT HEAD FINDINGS BRAIN: BRAIN Cerebral ventricle sizes are concordant with the degree of cerebral volume loss. Patchy and confluent areas of decreased attenuation are noted throughout the deep and periventricular white matter of the cerebral hemispheres bilaterally, compatible with chronic microvascular ischemic disease. No evidence of large-territorial acute infarction. No parenchymal hemorrhage. No mass lesion. No extra-axial collection. No mass effect or midline shift. No hydrocephalus. Basilar cisterns are patent. Vascular: No hyperdense vessel. Atherosclerotic calcifications are present within the cavernous internal carotid and vertebral arteries. Skull: No acute fracture or focal lesion. Sinuses/Orbits: Paranasal sinuses and mastoid air cells are clear. Bilateral lens replacement. Otherwise the orbits are unremarkable. Other: Left parieto-occipital 6 mm scalp hematoma formation. CT CERVICAL SPINE FINDINGS Alignment: Straightening of the normal cervical lordosis likely due to positioning and degenerative changes. Grade 1 anterolisthesis of C3 on C4, C4 on C5, C7 on T1. Skull base and vertebrae: Multilevel severe degenerative changes of the spine. No severe osseous neural foraminal or central canal stenosis. Severe multilevel intervertebral disc space narrowing. No acute fracture. No aggressive appearing focal osseous lesion or focal pathologic process. Soft tissues and spinal canal: No prevertebral fluid or swelling. No visible canal hematoma. Upper chest: Right upper lobe airspace opacity. Please see CT chest, abdomen, pelvis 10/30/2021. Other: None. IMPRESSION: 1. No acute intracranial abnormality. 2. No acute displaced fracture or  traumatic listhesis of the cervical spine. Electronically Signed   By: Iven Finn M.D.   On: 10/30/2021 23:32   CT CERVICAL SPINE WO CONTRAST  Result Date: 10/30/2021 CLINICAL DATA:  Head  trauma, moderate-severe; Polytrauma, blunt EXAM: CT HEAD WITHOUT CONTRAST CT CERVICAL SPINE WITHOUT CONTRAST TECHNIQUE: Multidetector CT imaging of the head and cervical spine was performed following the standard protocol without intravenous contrast. Multiplanar CT image reconstructions of the cervical spine were also generated. RADIATION DOSE REDUCTION: This exam was performed according to the departmental dose-optimization program which includes automated exposure control, adjustment of the mA and/or kV according to patient size and/or use of iterative reconstruction technique. COMPARISON:  None Available. FINDINGS: CT HEAD FINDINGS BRAIN: BRAIN Cerebral ventricle sizes are concordant with the degree of cerebral volume loss. Patchy and confluent areas of decreased attenuation are noted throughout the deep and periventricular white matter of the cerebral hemispheres bilaterally, compatible with chronic microvascular ischemic disease. No evidence of large-territorial acute infarction. No parenchymal hemorrhage. No mass lesion. No extra-axial collection. No mass effect or midline shift. No hydrocephalus. Basilar cisterns are patent. Vascular: No hyperdense vessel. Atherosclerotic calcifications are present within the cavernous internal carotid and vertebral arteries. Skull: No acute fracture or focal lesion. Sinuses/Orbits: Paranasal sinuses and mastoid air cells are clear. Bilateral lens replacement. Otherwise the orbits are unremarkable. Other: Left parieto-occipital 6 mm scalp hematoma formation. CT CERVICAL SPINE FINDINGS Alignment: Straightening of the normal cervical lordosis likely due to positioning and degenerative changes. Grade 1 anterolisthesis of C3 on C4, C4 on C5, C7 on T1. Skull base and vertebrae:  Multilevel severe degenerative changes of the spine. No severe osseous neural foraminal or central canal stenosis. Severe multilevel intervertebral disc space narrowing. No acute fracture. No aggressive appearing focal osseous lesion or focal pathologic process. Soft tissues and spinal canal: No prevertebral fluid or swelling. No visible canal hematoma. Upper chest: Right upper lobe airspace opacity. Please see CT chest, abdomen, pelvis 10/30/2021. Other: None. IMPRESSION: 1. No acute intracranial abnormality. 2. No acute displaced fracture or traumatic listhesis of the cervical spine. Electronically Signed   By: Iven Finn M.D.   On: 10/30/2021 23:32   DG Knee Left Port  Result Date: 10/30/2021 CLINICAL DATA:  Blunt trauma, fall. EXAM: PORTABLE LEFT KNEE - 1-2 VIEW COMPARISON:  None Available. FINDINGS: No evidence of fracture, dislocation, or joint effusion. Mild-to-moderate tricompartmental degenerative changes. Chondrocalcinosis is noted. Vascular calcifications are present in the soft tissues. IMPRESSION: 1. No acute fracture or dislocation. 2. Mild-to-moderate tricompartmental degenerative changes. 3. Chondrocalcinosis. Electronically Signed   By: Brett Fairy M.D.   On: 10/30/2021 23:10   DG Chest Port 1 View  Result Date: 10/30/2021 CLINICAL DATA:  Trauma. EXAM: PORTABLE CHEST 1 VIEW COMPARISON:  Chest x-ray 10/08/2021.  CT of the chest 10/08/2021. FINDINGS: Aorta is ectatic, unchanged. The heart is enlarged similar to the prior study. Small right pleural effusion is partially loculated. There are minimal strandy opacities at the left lung base favored is atelectasis. No evidence for pneumothorax. No acute fractures are seen. IMPRESSION: Small right pleural effusion, partially loculated. Stable cardiomegaly. Electronically Signed   By: Ronney Asters M.D.   On: 10/30/2021 23:07   DG Pelvis Portable  Result Date: 10/30/2021 CLINICAL DATA:  Trauma. EXAM: PORTABLE PELVIS 1-2 VIEWS COMPARISON:   Pelvis x-ray 10/08/2021 FINDINGS: Patient's hand overlies the right pelvis. The bones are osteopenic. There is no definite acute fracture or dislocation. Joint spaces are maintained. L5-S1 fusion hardware is visualized. Peripheral vascular calcifications are present. IMPRESSION: 1. Limited evaluation of the right iliac bone secondary to overlying artifact. No definite acute fracture or dislocation. Electronically Signed   By: Ronney Asters M.D.   On: 10/30/2021  23:06   CT CHEST ABDOMEN PELVIS W CONTRAST  Result Date: 10/08/2021 CLINICAL DATA:  Blunt polytrauma EXAM: CT CHEST, ABDOMEN, AND PELVIS WITH CONTRAST TECHNIQUE: Multidetector CT imaging of the chest, abdomen and pelvis was performed following the standard protocol during bolus administration of intravenous contrast. RADIATION DOSE REDUCTION: This exam was performed according to the departmental dose-optimization program which includes automated exposure control, adjustment of the mA and/or kV according to patient size and/or use of iterative reconstruction technique. CONTRAST:  56m OMNIPAQUE IOHEXOL 300 MG/ML SOLN IV. No oral contrast. COMPARISON:  CT angio chest 09/20/2021, CT abdomen and pelvis 10/04/2006 FINDINGS: CT CHEST FINDINGS Cardiovascular: Aorta normal caliber. Scattered atherosclerotic calcifications aorta in coronary arteries. Enlargement of cardiac chambers greatest at RIGHT atrium. No pericardial effusion. Pulmonary arteries adequately opacified. Large filling defect again identified within RIGHT pulmonary artery consistent with pulmonary embolism. Occlusive thrombus extends into RIGHT middle and RIGHT lower lobe pulmonary arteries. Smaller pulmonary emboli are seen in BILATERAL upper and LEFT lower lobe pulmonary arteries. These were all evident on the prior study as well. Mediastinum/Nodes: Esophagus unremarkable. Base of cervical region normal appearance. No thoracic adenopathy. Lungs/Pleura: Moderate RIGHT pleural effusion.  Subsegmental atelectasis RIGHT lower and RIGHT middle lobes. Subpleural interstitial changes LEFT lung again seen. No new infiltrate or pneumothorax. Musculoskeletal: Diffuse osseous demineralization. LEFT breast prosthesis. CT ABDOMEN PELVIS FINDINGS Hepatobiliary: Gallbladder and liver normal appearance Pancreas: Few scattered parenchymal calcifications. Atrophic. No definite mass. Spleen: Normal appearance Adrenals/Urinary Tract: Nonobstructing LEFT renal calculus. Adrenal glands, kidneys, ureters, and bladder otherwise normal appearance. Stomach/Bowel: Appendix not visualized. Scattered stool throughout colon. Uncomplicated sigmoid diverticula. Stomach and bowel loops otherwise unremarkable. Vascular/Lymphatic: Atherosclerotic calcifications aorta and iliac arteries without aneurysm. No adenopathy. Reproductive: Uterus surgically absent with nonvisualization of ovaries Other: No free air or free fluid.  No hernia. Musculoskeletal: Osseous demineralization with postsurgical changes of posterior L5-S1 fusion. IMPRESSION: BILATERAL pulmonary emboli as above with much larger clot burden on RIGHT, unchanged since 09/20/2021, with persistent enlargement of cardiac chambers particularly RIGHT atrium. Moderate RIGHT pleural effusion with subsegmental atelectasis at RIGHT lung base. No acute intra-abdominal or intrapelvic abnormalities. Nonobstructing LEFT renal calculus. Uncomplicated sigmoid diverticulosis. Aortic Atherosclerosis (ICD10-I70.0). Findings discussed with Dr. PAlvino Chapelon 10/08/2021 at 1249 hours. Electronically Signed   By: MLavonia DanaM.D.   On: 10/08/2021 12:49   CT HEAD WO CONTRAST (5MM)  Result Date: 10/08/2021 CLINICAL DATA:  Head trauma, minor (Age >= 65y); Facial trauma, blunt; Neck trauma (Age >= 65y) EXAM: CT HEAD WITHOUT CONTRAST CT MAXILLOFACIAL WITHOUT CONTRAST CT CERVICAL SPINE WITHOUT CONTRAST TECHNIQUE: Multidetector CT imaging of the head, cervical spine, and maxillofacial structures  were performed using the standard protocol without intravenous contrast. Multiplanar CT image reconstructions of the cervical spine and maxillofacial structures were also generated. RADIATION DOSE REDUCTION: This exam was performed according to the departmental dose-optimization program which includes automated exposure control, adjustment of the mA and/or kV according to patient size and/or use of iterative reconstruction technique. COMPARISON:  None Available. FINDINGS: CT HEAD FINDINGS Brain: No evidence of acute infarction, hemorrhage, hydrocephalus, extra-axial collection or mass lesion/mass effect. Patchy white matter hypoattenuation, nonspecific but compatible with chronic microvascular ischemic disease. Cerebral atrophy. Vascular: No hyperdense vessel identified. Calcific intracranial atherosclerosis. Skull: No acute fracture. Other: No mastoid effusions. CT MAXILLOFACIAL FINDINGS Osseous: No fracture or mandibular dislocation. No destructive process. Orbits: Negative. No traumatic or inflammatory finding. Sinuses: Clear sinuses. Soft tissues: Negative. CT CERVICAL SPINE FINDINGS Alignment: Unchanged alignment with similar anterolisthesis of C4 on  C5 and C7 on T1. No new sagittal subluxation. Skull base and vertebrae: Vertebral body heights are maintained. No evidence of acute fracture. Soft tissues and spinal canal: No prevertebral fluid or swelling. No visible canal hematoma. Disc levels: Similar multilevel degenerative disc disease, moderate to severe. Similar multilevel facet and uncovertebral hypertrophy with varying degrees of neural foraminal stenosis. Upper chest: Partially imaged right pleural effusion. IMPRESSION: 1. No evidence of acute intracranial abnormality or facial fracture. 2. No evidence of acute fracture or traumatic malalignment in the cervical spine. 3. Partially imaged right pleural effusion. Electronically Signed   By: Margaretha Sheffield M.D.   On: 10/08/2021 09:07   CT Maxillofacial  Wo Contrast  Result Date: 10/08/2021 CLINICAL DATA:  Head trauma, minor (Age >= 65y); Facial trauma, blunt; Neck trauma (Age >= 65y) EXAM: CT HEAD WITHOUT CONTRAST CT MAXILLOFACIAL WITHOUT CONTRAST CT CERVICAL SPINE WITHOUT CONTRAST TECHNIQUE: Multidetector CT imaging of the head, cervical spine, and maxillofacial structures were performed using the standard protocol without intravenous contrast. Multiplanar CT image reconstructions of the cervical spine and maxillofacial structures were also generated. RADIATION DOSE REDUCTION: This exam was performed according to the departmental dose-optimization program which includes automated exposure control, adjustment of the mA and/or kV according to patient size and/or use of iterative reconstruction technique. COMPARISON:  None Available. FINDINGS: CT HEAD FINDINGS Brain: No evidence of acute infarction, hemorrhage, hydrocephalus, extra-axial collection or mass lesion/mass effect. Patchy white matter hypoattenuation, nonspecific but compatible with chronic microvascular ischemic disease. Cerebral atrophy. Vascular: No hyperdense vessel identified. Calcific intracranial atherosclerosis. Skull: No acute fracture. Other: No mastoid effusions. CT MAXILLOFACIAL FINDINGS Osseous: No fracture or mandibular dislocation. No destructive process. Orbits: Negative. No traumatic or inflammatory finding. Sinuses: Clear sinuses. Soft tissues: Negative. CT CERVICAL SPINE FINDINGS Alignment: Unchanged alignment with similar anterolisthesis of C4 on C5 and C7 on T1. No new sagittal subluxation. Skull base and vertebrae: Vertebral body heights are maintained. No evidence of acute fracture. Soft tissues and spinal canal: No prevertebral fluid or swelling. No visible canal hematoma. Disc levels: Similar multilevel degenerative disc disease, moderate to severe. Similar multilevel facet and uncovertebral hypertrophy with varying degrees of neural foraminal stenosis. Upper chest: Partially  imaged right pleural effusion. IMPRESSION: 1. No evidence of acute intracranial abnormality or facial fracture. 2. No evidence of acute fracture or traumatic malalignment in the cervical spine. 3. Partially imaged right pleural effusion. Electronically Signed   By: Margaretha Sheffield M.D.   On: 10/08/2021 09:07   CT Cervical Spine Wo Contrast  Result Date: 10/08/2021 CLINICAL DATA:  Head trauma, minor (Age >= 65y); Facial trauma, blunt; Neck trauma (Age >= 65y) EXAM: CT HEAD WITHOUT CONTRAST CT MAXILLOFACIAL WITHOUT CONTRAST CT CERVICAL SPINE WITHOUT CONTRAST TECHNIQUE: Multidetector CT imaging of the head, cervical spine, and maxillofacial structures were performed using the standard protocol without intravenous contrast. Multiplanar CT image reconstructions of the cervical spine and maxillofacial structures were also generated. RADIATION DOSE REDUCTION: This exam was performed according to the departmental dose-optimization program which includes automated exposure control, adjustment of the mA and/or kV according to patient size and/or use of iterative reconstruction technique. COMPARISON:  None Available. FINDINGS: CT HEAD FINDINGS Brain: No evidence of acute infarction, hemorrhage, hydrocephalus, extra-axial collection or mass lesion/mass effect. Patchy white matter hypoattenuation, nonspecific but compatible with chronic microvascular ischemic disease. Cerebral atrophy. Vascular: No hyperdense vessel identified. Calcific intracranial atherosclerosis. Skull: No acute fracture. Other: No mastoid effusions. CT MAXILLOFACIAL FINDINGS Osseous: No fracture or mandibular dislocation. No destructive process. Orbits:  Negative. No traumatic or inflammatory finding. Sinuses: Clear sinuses. Soft tissues: Negative. CT CERVICAL SPINE FINDINGS Alignment: Unchanged alignment with similar anterolisthesis of C4 on C5 and C7 on T1. No new sagittal subluxation. Skull base and vertebrae: Vertebral body heights are maintained.  No evidence of acute fracture. Soft tissues and spinal canal: No prevertebral fluid or swelling. No visible canal hematoma. Disc levels: Similar multilevel degenerative disc disease, moderate to severe. Similar multilevel facet and uncovertebral hypertrophy with varying degrees of neural foraminal stenosis. Upper chest: Partially imaged right pleural effusion. IMPRESSION: 1. No evidence of acute intracranial abnormality or facial fracture. 2. No evidence of acute fracture or traumatic malalignment in the cervical spine. 3. Partially imaged right pleural effusion. Electronically Signed   By: Margaretha Sheffield M.D.   On: 10/08/2021 09:07   DG Pelvis Portable  Result Date: 10/08/2021 CLINICAL DATA:  Fall EXAM: PORTABLE PELVIS 1-2 VIEWS COMPARISON:  Portable exam 0833 hours compared to 08/13/2021 FINDINGS: Osseous demineralization. BILATERAL hip joint narrowing and spur formation. SI joints preserved. Prior L5-S1 fusion. No acute fracture, dislocation, or bone destruction. Atherosclerotic calcifications at the femoral arteries bilaterally. Significant lateral soft tissue swelling at the level of the LEFT hip and proximal LEFT femur. IMPRESSION: No acute osseous abnormalities. Degenerative changes of BILATERAL hip joints. Electronically Signed   By: Lavonia Dana M.D.   On: 10/08/2021 08:50   DG Chest Portable 1 View  Result Date: 10/08/2021 CLINICAL DATA:  Fall EXAM: PORTABLE CHEST 1 VIEW COMPARISON:  Portable exam 0832 hours compared to 09/20/2021 FINDINGS: Enlargement of cardiac silhouette. Atherosclerotic calcification aorta. Mediastinal contours and pulmonary vascularity otherwise normal. Small RIGHT pleural effusion with RIGHT basilar atelectasis, minimally increased from previous exam. No acute infiltrate or pneumothorax. Diffuse osseous demineralization with question chronic LEFT rotator cuff tear. Degenerative changes at RIGHT sternoclavicular joint. Degenerative disc disease changes and biconvex scoliosis  thoracolumbar spine. No definite acute fractures. IMPRESSION: Slightly increased RIGHT pleural effusion and basilar atelectasis versus prior exam. Enlargement of cardiac silhouette. Aortic Atherosclerosis (ICD10-I70.0). Electronically Signed   By: Lavonia Dana M.D.   On: 10/08/2021 08:48    Microbiology: Results for orders placed or performed during the hospital encounter of 10/30/21  Resp Panel by RT-PCR (Flu A&B, Covid) Anterior Nasal Swab     Status: None   Collection Time: 10/30/21 10:58 PM   Specimen: Anterior Nasal Swab  Result Value Ref Range Status   SARS Coronavirus 2 by RT PCR NEGATIVE NEGATIVE Final    Comment: (NOTE) SARS-CoV-2 target nucleic acids are NOT DETECTED.  The SARS-CoV-2 RNA is generally detectable in upper respiratory specimens during the acute phase of infection. The lowest concentration of SARS-CoV-2 viral copies this assay can detect is 138 copies/mL. A negative result does not preclude SARS-Cov-2 infection and should not be used as the sole basis for treatment or other patient management decisions. A negative result may occur with  improper specimen collection/handling, submission of specimen other than nasopharyngeal swab, presence of viral mutation(s) within the areas targeted by this assay, and inadequate number of viral copies(<138 copies/mL). A negative result must be combined with clinical observations, patient history, and epidemiological information. The expected result is Negative.  Fact Sheet for Patients:  EntrepreneurPulse.com.au  Fact Sheet for Healthcare Providers:  IncredibleEmployment.be  This test is no t yet approved or cleared by the [place] FDA and  has been authorized for detection and/or diagnosis of SARS-CoV-2 by FDA under an Emergency Use Authorization (EUA). This EUA will remain  in effect (  meaning this test can be used) for the duration of the COVID-19 declaration under Section 564(b)(1)  of the Act, 21 U.S.C.section 360bbb-3(b)(1), unless the authorization is terminated  or revoked sooner.       Influenza A by PCR NEGATIVE NEGATIVE Final   Influenza B by PCR NEGATIVE NEGATIVE Final    Comment: (NOTE) The Xpert Xpress SARS-CoV-2/FLU/RSV plus assay is intended as an aid in the diagnosis of influenza from Nasopharyngeal swab specimens and should not be used as a sole basis for treatment. Nasal washings and aspirates are unacceptable for Xpert Xpress SARS-CoV-2/FLU/RSV testing.  Fact Sheet for Patients: EntrepreneurPulse.com.au  Fact Sheet for Healthcare Providers: IncredibleEmployment.be  This test is not yet approved or cleared by the [place] FDA and has been authorized for detection and/or diagnosis of SARS-CoV-2 by FDA under an Emergency Use Authorization (EUA). This EUA will remain in effect (meaning this test can be used) for the duration of the COVID-19 declaration under Section 564(b)(1) of the Act, 21 U.S.C. section 360bbb-3(b)(1), unless the authorization is terminated or revoked.  Performed at [facility], [place] [address] [place], [place] [address]   Respiratory (~20 pathogens) panel by PCR     Status: None   Collection Time: 10/31/21 11:20 AM   Specimen: Nasopharyngeal Swab; Respiratory  Result Value Ref Range Status   Adenovirus NOT DETECTED NOT DETECTED Final   Coronavirus 229E NOT DETECTED NOT DETECTED Final    Comment: (NOTE) The Coronavirus on the Respiratory Panel, DOES NOT test for the novel  Coronavirus (2019 nCoV)    Coronavirus HKU1 NOT DETECTED NOT DETECTED Final   Coronavirus NL63 NOT DETECTED NOT DETECTED Final   Coronavirus OC43 NOT DETECTED NOT DETECTED Final   Metapneumovirus NOT DETECTED NOT DETECTED Final   Rhinovirus / Enterovirus NOT DETECTED NOT DETECTED Final   Influenza A NOT DETECTED NOT DETECTED Final   Influenza B NOT DETECTED NOT DETECTED Final   Parainfluenza Virus 1  NOT DETECTED NOT DETECTED Final   Parainfluenza Virus 2 NOT DETECTED NOT DETECTED Final   Parainfluenza Virus 3 NOT DETECTED NOT DETECTED Final   Parainfluenza Virus 4 NOT DETECTED NOT DETECTED Final   Respiratory Syncytial Virus NOT DETECTED NOT DETECTED Final   Bordetella pertussis NOT DETECTED NOT DETECTED Final   Bordetella Parapertussis NOT DETECTED NOT DETECTED Final   Chlamydophila pneumoniae NOT DETECTED NOT DETECTED Final   Mycoplasma pneumoniae NOT DETECTED NOT DETECTED Final    Comment: Performed at [facility], [place]. [address] [place],  [address]    Labs: CBC: Recent Labs  Lab 10/30/21 2246 10/30/21 2308 10/31/21 0435 10/31/21 0452  WBC 11.3*  --  10.9*  --   HGB 9.8* 11.2* 9.7* 9.5*  HCT 30.5* 33.0* 32.6* 28.0*  MCV 94.7  --  101.2*  --   PLT 440*  --  394  --    Basic Metabolic Panel: Recent Labs  Lab 10/30/21 2246 10/30/21 2308 10/31/21 0435 10/31/21 0452  NA 141 141 142 140  K 3.6 3.8 3.7 3.5  CL 107 106 105  --   CO2 24  --  22  --   GLUCOSE 91 85 93  --   BUN 27* 34* 26*  --   CREATININE 0.92 0.70 0.78  --   CALCIUM 8.7*  --  8.9  --   MG  --   --  1.9  --    Liver Function Tests: Recent Labs  Lab 10/30/21 2246  AST  33  ALT 20  ALKPHOS 111  BILITOT 1.3*  PROT 5.7*  ALBUMIN 2.4*   CBG: No results for input(s): "GLUCAP" in the last 168 hours.  Discharge time spent: greater than 30 minutes.  Signed: Aylen Friendly, MD [facility] 10/31/2021

## 2021-10-31 NOTE — H&P (Signed)
History and Physical    Judy [name] YFV:[id number] DOB: 1930/11/17 DOA: 10/30/2021  PCP: No primary care provider on file.  Patient coming from: SNF  Chief Complaint: Altered mental status  HPI: Judy [name] is a 88 y.o. female with medical history significant of dementia, paroxysmal A-fib, chronic systolic CHF (EF 40 to 16% per echo done 07/2021), breast cancer status post left breast mastectomy, chronic low back pain, hyperlipidemia, GERD, hypertension, osteoporosis, osteoarthritis.  She was admitted to the hospital last month 8/7-8/10 for large central and subsegmental bilateral PE and was not a candidate for thrombolysis.  Work-up also revealed right lower extremity DVT.  Patient had declined echocardiogram.  She was discharged on Eliquis.  Readmitted 8/25-8/29 after a fall at her facility and found to be in A-fib with RVR.  Hospital course complicated by encephalopathy with agitation and periods of lethargy.  Foley catheter was placed for urinary retention.  She was also treated for a UTI.  She had a moderate right pleural effusion in setting of history of breast cancer, family decided not to pursue thoracentesis.  Palliative care was consulted and she was discharged to her facility with hospice care.  She returns to the ED tonight from her facility with altered mental status.  She had a fall at her facility approximately 5 hours prior to arrival.  It is reported that at baseline patient is able to carry a conversation, however, on EMS arrival she was screaming, moving all extremities, and not following any commands.  She had a scalp hematoma.  Patient noted to be in A-fib with RVR with rate in the 120s to 160s.  Labs showing WBC 11.3, hemoglobin 9.8 (stable since recent discharge), platelet count 440k, BUN 27, creatinine 0.9, calcium 8.7, albumin 2.4, no significant elevation of LFTs, blood ethanol level undetectable, total CK normal, COVID and influenza PCR negative, UA without signs of infection.   Pelvic x-ray, left knee x-ray, and CT head/C-spine negative for acute traumatic injuries.  CT chest/abdomen/pelvis with contrast showing: "IMPRESSION: 1. Similar-appearing occlusive right main pulmonary embolus extending to the right middle and right lower lobes. Persistent, slightly decreased in size, left upper lobe and left lower lobe segmental and subsegmental pulmonary emboli. Possible right upper lobe subsegmental pulmonary embolus. Limited evaluation due to timing of contrast. Persistent enlarged right atrium with possible underlying right heart strain. 2. Interval development of a masslike right apical airspace opacity with a subjacent 7 mm pulmonary nodule. Finding may represent infection/inflammation. Pulmonary infarction not excluded. Recommend follow-up CT in 3 months to evaluate for resolution. 3. Partially visualized at least 7.5 x 7.2 x 8.4 cm left hip subcutaneus soft tissue hematoma. 4. No acute traumatic injury to the chest, abdomen, or pelvis.   5. No acute fracture or traumatic malalignment of the thoracic or lumbar spine.   Other imaging findings of potential clinical significance:   1. Interval decrease in size of a small to moderate volume right pleural effusion. 2. Interval increase in size of a trace volume left pleural effusion. 3. Similar-appearing 7.5 x 2.8 cm left breast mass density likely representing an implant. Recommend correlation with clinical history. 4. Nonobstructive bilateral nephrolithiasis. 5. Cholelithiasis with no acute cholecystitis. 6. Colonic diverticulosis with no acute diverticulitis. 7. Anasarca."  Patient was given Haldol, Ativan, ceftriaxone, and azithromycin.  Also started on Cardizem infusion and heparin infusion.  Critical care consulted.  Patient is very confused.  Moaning and screaming.  Not able to give any history.  Review of Systems:  Review of Systems  Reason unable to perform ROS: AMS.    Past Medical  History:  Diagnosis Date   Blood transfusion    Breast cancer, left ([place]) 09/26/2011   Punch biopsies of the left nipple done on 09/16/2011.  ER and PR were negative.  HER2 is positive with ratio 4.68.Had mastectomy on 10/25/11. Path T1,N0.    Chronic low back pain    Dyslipidemia    GERD (gastroesophageal reflux disease)    HTN (hypertension)    Osteoarthritis    Osteoporosis     Past Surgical History:  Procedure Laterality Date   BACK SURGERY  2009   lumb lam   BREAST SURGERY  2009   lt lumpectomy-plus 2 re-excisions for margins   left breast mastectomy  2013   MASTECTOMY Left    ROTATOR CUFF REPAIR     Bilateral, 2010   TOTAL ABDOMINAL HYSTERECTOMY       reports that she has never smoked. She has never used smokeless tobacco. She reports that she does not drink alcohol and does not use drugs.  Allergies  Allergen Reactions   Amoxil [Amoxicillin] Nausea Only    NOT ON MAR   Codeine Nausea Only    NOT ON MAR   Aricept [Donepezil Hcl] Other (See Comments)    "Felt drunk" Headaches Drowsiness  NOT ON MAR   Catapres [Clonidine] Other (See Comments)    Dry mouth  NOT ON MAR   Hycodan [Hydrocodone Bit-Homatrop Mbr] Diarrhea and Nausea And Vomiting    NOT ON MAR   Norco [Hydrocodone-Acetaminophen] Diarrhea and Nausea And Vomiting   Norvasc [Amlodipine] Swelling    NOT ON MAR   Prolia [Denosumab] Other (See Comments)    Unknown reaction NOT ON MAR   Ultram [Tramadol] Nausea Only    NOT ON MAR   Zoloft [Sertraline] Nausea Only    NOT ON MAR   Clavulanic Acid Hives and Rash    NOT ON MAR    Family History  Problem Relation Age of Onset   Cancer Sister        breast   Breast cancer Sister    Colon cancer Brother    Cancer Sister        lung   Diabetes Son    Esophageal cancer Neg Hx    Rectal cancer Neg Hx    Stomach cancer Neg Hx     Prior to Admission medications   Medication Sig Start Date End Date Taking? Authorizing Provider  acetaminophen  (TYLENOL) 500 MG tablet Take 1,000 mg by mouth every 12 (twelve) hours. May also give 1 tablet every 6 hours as needed for fever of 99.5 to 101 F, minor headache or discomfort   Yes [provider]  aluminum-magnesium hydroxide-simethicone (MAALOX) 200-200-20 MG/5ML SUSP Take 30 mLs by mouth every 6 (six) hours as needed (for constipation).   Yes [provider]  atorvastatin (LIPITOR) 10 MG tablet Take 1 tablet (10 mg total) by mouth daily at 6 PM. Patient taking differently: Take 10 mg by mouth at bedtime. 08/03/21  Yes Little Ishikawa, MD  diltiazem (CARDIZEM CD) 240 MG 24 hr capsule Take 1 capsule (240 mg total) by mouth daily. 10/12/21  Yes Regalado, Belkys A, MD  guaifenesin (ROBITUSSIN) 100 MG/5ML syrup Take 200 mg by mouth every 6 (six) hours as needed for cough.   Yes [provider]  loperamide (IMODIUM) 2 MG capsule Take 2 mg by mouth daily as needed for diarrhea or  loose stools.   Yes [provider]  LORazepam (ATIVAN) 0.5 MG tablet Take 0.5 mg by mouth 2 (two) times daily. May also give 1 tablet every 6 hours as needed for anxiety or agitation 10/26/21  Yes [provider]  magnesium hydroxide (MILK OF MAGNESIA) 400 MG/5ML suspension Take 30 mLs by mouth at bedtime as needed for mild constipation.   Yes [provider]  metoprolol tartrate (LOPRESSOR) 100 MG tablet Take 1 tablet (100 mg total) by mouth 2 (two) times daily. 10/12/21  Yes Regalado, Belkys A, MD  mirtazapine (REMERON) 7.5 MG tablet Take 7.5 mg by mouth every evening.   Yes [provider]  neomycin-bacitracin-polymyxin (NEOSPORIN) [phone] ointment Apply 1 Application topically daily as needed (For minor skin tear).   Yes [provider]  polyethylene glycol powder (GLYCOLAX/MIRALAX) 17 GM/SCOOP powder Take 1 capful (17 g) with water by mouth daily. 10/12/21  Yes Regalado, Belkys A, MD  QUEtiapine (SEROQUEL) 25 MG tablet Take 1 tablet (25 mg total) by  mouth at bedtime. 10/12/21  Yes Regalado, Belkys A, MD  saccharomyces boulardii (FLORASTOR) 250 MG capsule Take 250 mg by mouth daily.   Yes [provider]  traMADol (ULTRAM) 50 MG tablet Take 50 mg by mouth 3 (three) times daily.   Yes [provider]  apixaban (ELIQUIS) 5 MG TABS tablet Take 5 mg by mouth 2 (two) times daily. Patient not taking: Reported on 10/31/2021    [provider]  Omega-3 Fatty Acids (OMEGA-3 PO) Take 1 capsule by mouth daily. Patient not taking: Reported on 10/31/2021    [provider]    Physical Exam: Vitals:   10/31/21 0030 10/31/21 0045 10/31/21 0100 10/31/21 0245  BP: (!) 126/92 (!) 135/91 112/87 132/87  Pulse: (!) 34 (!) 45 (!) 38 (!) 114  Resp: 14 (!) _0 Temp:      TempSrc:      SpO2:  (!) 87% (!) 89% 98%  Weight:      Height:        Physical Exam Vitals reviewed.  HENT:     Head: Normocephalic and atraumatic.  Eyes:     Extraocular Movements: Extraocular movements intact.  Cardiovascular:     Rate and Rhythm: Tachycardia present. Rhythm irregular.     Pulses: Normal pulses.  Pulmonary:     Comments: Examination limited due to lack of patient cooperation, no wheezing appreciated Abdominal:     General: Bowel sounds are normal. There is no distension.     Palpations: Abdomen is soft.     Tenderness: There is no abdominal tenderness.  Musculoskeletal:     Cervical back: Normal range of motion.     Right lower leg: Edema present.     Left lower leg: Edema present.     Comments: +3 bilateral pedal edema  Skin:    General: Skin is warm and dry.  Neurological:     Comments: Very confused, not following any commands Moving all extremities spontaneously, no focal weakness      Labs on Admission: I have personally reviewed following labs and imaging studies  CBC: Recent Labs  Lab 10/30/21 2246 10/30/21 2308  WBC 11.3*  --   HGB 9.8* 11.2*  HCT 30.5* 33.0*  MCV 94.7  --   PLT 440*  --     Basic Metabolic Panel: Recent Labs  Lab 10/30/21 2246 10/30/21 2308  NA 141 141  K 3.6 3.8  CL 107 106  CO2  24  --   GLUCOSE 91 85  BUN 27* 34*  CREATININE 0.92 0.70  CALCIUM 8.7*  --    GFR: Estimated Creatinine Clearance: 41.2 mL/min (by C-G formula based on SCr of 0.7 mg/dL). Liver Function Tests: Recent Labs  Lab 10/30/21 2246  AST 33  ALT 20  ALKPHOS 111  BILITOT 1.3*  PROT 5.7*  ALBUMIN 2.4*   No results for input(s): "LIPASE", "AMYLASE" in the last 168 hours. No results for input(s): "AMMONIA" in the last 168 hours. Coagulation Profile: Recent Labs  Lab 10/30/21 2246  INR 1.4*   Cardiac Enzymes: Recent Labs  Lab 10/30/21 2246  CKTOTAL 81  CKMB 6.1*   BNP (last 3 results) No results for input(s): "PROBNP" in the last 8760 hours. HbA1C: No results for input(s): "HGBA1C" in the last 72 hours. CBG: No results for input(s): "GLUCAP" in the last 168 hours. Lipid Profile: No results for input(s): "CHOL", "HDL", "LDLCALC", "TRIG", "CHOLHDL", "LDLDIRECT" in the last 72 hours. Thyroid Function Tests: No results for input(s): "TSH", "T4TOTAL", "FREET4", "T3FREE", "THYROIDAB" in the last 72 hours. Anemia Panel: No results for input(s): "VITAMINB12", "FOLATE", "FERRITIN", "TIBC", "IRON", "RETICCTPCT" in the last 72 hours. Urine analysis:    Component Value Date/Time   COLORURINE AMBER (A) 10/31/2021 0132   APPEARANCEUR CLEAR 10/31/2021 0132   LABSPEC 1.032 (H) 10/31/2021 0132   PHURINE 5.0 10/31/2021 0132   GLUCOSEU NEGATIVE 10/31/2021 0132   HGBUR NEGATIVE 10/31/2021 0132   BILIRUBINUR NEGATIVE 10/31/2021 0132   KETONESUR NEGATIVE 10/31/2021 0132   PROTEINUR NEGATIVE 10/31/2021 0132   NITRITE NEGATIVE 10/31/2021 0132   LEUKOCYTESUR NEGATIVE 10/31/2021 0132    Radiological Exams on Admission: CT CHEST ABDOMEN PELVIS W CONTRAST  Addendum Date: 10/31/2021   ADDENDUM REPORT: 10/31/2021 00:06 ADDENDUM: These results were called by telephone at the  time of interpretation on 10/31/2021 at 12:06 am to provider Dr. Matilde Sprang, who verbally acknowledged these results. Electronically Signed   By: Iven Finn M.D.   On: 10/31/2021 00:06   Result Date: 10/31/2021 CLINICAL DATA:  Polytrauma, blunt [id number] EXAM: CT CHEST, ABDOMEN, AND PELVIS WITH CONTRAST TECHNIQUE: Multidetector CT imaging of the chest, abdomen and pelvis was performed following the standard protocol during bolus administration of intravenous contrast. RADIATION DOSE REDUCTION: This exam was performed according to the departmental dose-optimization program which includes automated exposure control, adjustment of the mA and/or kV according to patient size and/or use of iterative reconstruction technique. CONTRAST:  97m OMNIPAQUE IOHEXOL 350 MG/ML SOLN COMPARISON:  CT chest, abdomen, pelvis 10/08/2021 FINDINGS: CHEST: Cardiovascular: No aortic injury. The thoracic aorta is normal in caliber. The heart is normal in size. No significant pericardial effusion. The main pulmonary artery is borderline enlarged in caliber measuring up 3.1 cm. Similar-appearing occlusive right main pulmonary embolus extending to the right middle and right lower lobes. Possible right upper lobe subsegmental pulmonary embolus. Persistent, slightly decreased in size, left upper lobe and left lower lobe segmental and subsegmental pulmonary emboli. Limited evaluation due to timing of contrast. Mediastinum/Nodes: No pneumomediastinum. No mediastinal hematoma. The esophagus is unremarkable. The thyroid is unremarkable. The central airways are patent. No mediastinal, hilar, or axillary lymphadenopathy. Lungs/Pleura: Interval development of a masslike slightly bubbly right apical airspace opacity. Interval development of a subjacent right upper lobe 7 mm pulmonary nodule (5:29). Interval development of right middle lobe interlobular and ground-glass airspace opacity (5:62). Similar-appearing left upper lobe anterior peripheral  reticulations. No pulmonary contusion or laceration. No pneumatocele formation. Interval decrease in size of  a small to moderate volume right pleural effusion. Interval increase in size of a trace volume left pleural effusion. No pneumothorax. No hemothorax. Musculoskeletal/Chest wall: Similar-appearing 7.5 x 2.8 cm left breast mass density likely representing an implant. Diffuse subcutaneus soft tissue edema. No acute rib or sternal fracture. Old right posterior ninth and tenth rib fractures. No spinal fracture.  Multilevel degenerative changes spine. Degenerative changes of bilateral shoulders. ABDOMEN / PELVIS: Hepatobiliary: Not enlarged. No focal lesion. No laceration or subcapsular hematoma. Layering hyperdensity within the gallbladder lumen likely gallstones. No gallbladder wall thickening or pericholecystic fluid. No biliary ductal dilatation. Pancreas: Atrophic. Otherwise normal pancreatic contour. No main pancreatic duct dilatation. Spleen: Not enlarged. No focal lesion. No laceration, subcapsular hematoma, or vascular injury. Adrenals/Urinary Tract: No nodularity bilaterally. Bilateral kidneys enhance symmetrically. 5 mm calcification within the left kidney. 2 mm calcification within the right kidney. No ureterolithiasis. No hydronephrosis. No contusion, laceration, or subcapsular hematoma. No injury to the vascular structures or collecting systems. No hydroureter. The urinary bladder is unremarkable. Stomach/Bowel: No small or large bowel wall thickening or dilatation. Colonic diverticulosis. The appendix is not definitely identified with no inflammatory changes in the right lower quadrant to suggest acute appendicitis. Vasculature/Lymphatics: Atherosclerotic plaque. No abdominal aorta or iliac aneurysm. No active contrast extravasation or pseudoaneurysm. No abdominal, pelvic, inguinal lymphadenopathy. Reproductive: Status post hysterectomy. No bilateral adnexal masses. Other: Nonspecific trace free  fluid within the pelvis. No pneumoperitoneum. No hemoperitoneum. No mesenteric hematoma identified. No organized fluid collection. Musculoskeletal: Left hip 7.5 x 7.2 x at least 8.4 cm hyperdense subcutaneus soft tissue lesion likely representing a hematoma. Diffuse subcutaneus soft tissue edema. No acute pelvic fracture. No spinal fracture. L5-S1 posterolateral fusion surgical hardware. Multilevel degenerative changes of the spine. Ports and Devices: None. IMPRESSION: 1. Similar-appearing occlusive right main pulmonary embolus extending to the right middle and right lower lobes. Persistent, slightly decreased in size, left upper lobe and left lower lobe segmental and subsegmental pulmonary emboli. Possible right upper lobe subsegmental pulmonary embolus. Limited evaluation due to timing of contrast. Persistent enlarged right atrium with possible underlying right heart strain. 2. Interval development of a masslike right apical airspace opacity with a subjacent 7 mm pulmonary nodule. Finding may represent infection/inflammation. Pulmonary infarction not excluded. Recommend follow-up CT in 3 months to evaluate for resolution. 3. Partially visualized at least 7.5 x 7.2 x 8.4 cm left hip subcutaneus soft tissue hematoma. 4. No acute traumatic injury to the chest, abdomen, or pelvis. 5. No acute fracture or traumatic malalignment of the thoracic or lumbar spine. Other imaging findings of potential clinical significance: 1. Interval decrease in size of a small to moderate volume right pleural effusion. 2. Interval increase in size of a trace volume left pleural effusion. 3. Similar-appearing 7.5 x 2.8 cm left breast mass density likely representing an implant. Recommend correlation with clinical history. 4. Nonobstructive bilateral nephrolithiasis. 5. Cholelithiasis with no acute cholecystitis. 6. Colonic diverticulosis with no acute diverticulitis. 7. Anasarca. Electronically Signed: By: Iven Finn M.D. On:  10/31/2021 00:01   CT HEAD WO CONTRAST  Result Date: 10/30/2021 CLINICAL DATA:  Head trauma, moderate-severe; Polytrauma, blunt EXAM: CT HEAD WITHOUT CONTRAST CT CERVICAL SPINE WITHOUT CONTRAST TECHNIQUE: Multidetector CT imaging of the head and cervical spine was performed following the standard protocol without intravenous contrast. Multiplanar CT image reconstructions of the cervical spine were also generated. RADIATION DOSE REDUCTION: This exam was performed according to the departmental dose-optimization program which includes automated exposure control, adjustment of the mA and/or  kV according to patient size and/or use of iterative reconstruction technique. COMPARISON:  None Available. FINDINGS: CT HEAD FINDINGS BRAIN: BRAIN Cerebral ventricle sizes are concordant with the degree of cerebral volume loss. Patchy and confluent areas of decreased attenuation are noted throughout the deep and periventricular white matter of the cerebral hemispheres bilaterally, compatible with chronic microvascular ischemic disease. No evidence of large-territorial acute infarction. No parenchymal hemorrhage. No mass lesion. No extra-axial collection. No mass effect or midline shift. No hydrocephalus. Basilar cisterns are patent. Vascular: No hyperdense vessel. Atherosclerotic calcifications are present within the cavernous internal carotid and vertebral arteries. Skull: No acute fracture or focal lesion. Sinuses/Orbits: Paranasal sinuses and mastoid air cells are clear. Bilateral lens replacement. Otherwise the orbits are unremarkable. Other: Left parieto-occipital 6 mm scalp hematoma formation. CT CERVICAL SPINE FINDINGS Alignment: Straightening of the normal cervical lordosis likely due to positioning and degenerative changes. Grade 1 anterolisthesis of C3 on C4, C4 on C5, C7 on T1. Skull base and vertebrae: Multilevel severe degenerative changes of the spine. No severe osseous neural foraminal or central canal stenosis.  Severe multilevel intervertebral disc space narrowing. No acute fracture. No aggressive appearing focal osseous lesion or focal pathologic process. Soft tissues and spinal canal: No prevertebral fluid or swelling. No visible canal hematoma. Upper chest: Right upper lobe airspace opacity. Please see CT chest, abdomen, pelvis 10/30/2021. Other: None. IMPRESSION: 1. No acute intracranial abnormality. 2. No acute displaced fracture or traumatic listhesis of the cervical spine. Electronically Signed   By: Iven Finn M.D.   On: 10/30/2021 23:32   CT CERVICAL SPINE WO CONTRAST  Result Date: 10/30/2021 CLINICAL DATA:  Head trauma, moderate-severe; Polytrauma, blunt EXAM: CT HEAD WITHOUT CONTRAST CT CERVICAL SPINE WITHOUT CONTRAST TECHNIQUE: Multidetector CT imaging of the head and cervical spine was performed following the standard protocol without intravenous contrast. Multiplanar CT image reconstructions of the cervical spine were also generated. RADIATION DOSE REDUCTION: This exam was performed according to the departmental dose-optimization program which includes automated exposure control, adjustment of the mA and/or kV according to patient size and/or use of iterative reconstruction technique. COMPARISON:  None Available. FINDINGS: CT HEAD FINDINGS BRAIN: BRAIN Cerebral ventricle sizes are concordant with the degree of cerebral volume loss. Patchy and confluent areas of decreased attenuation are noted throughout the deep and periventricular white matter of the cerebral hemispheres bilaterally, compatible with chronic microvascular ischemic disease. No evidence of large-territorial acute infarction. No parenchymal hemorrhage. No mass lesion. No extra-axial collection. No mass effect or midline shift. No hydrocephalus. Basilar cisterns are patent. Vascular: No hyperdense vessel. Atherosclerotic calcifications are present within the cavernous internal carotid and vertebral arteries. Skull: No acute fracture or  focal lesion. Sinuses/Orbits: Paranasal sinuses and mastoid air cells are clear. Bilateral lens replacement. Otherwise the orbits are unremarkable. Other: Left parieto-occipital 6 mm scalp hematoma formation. CT CERVICAL SPINE FINDINGS Alignment: Straightening of the normal cervical lordosis likely due to positioning and degenerative changes. Grade 1 anterolisthesis of C3 on C4, C4 on C5, C7 on T1. Skull base and vertebrae: Multilevel severe degenerative changes of the spine. No severe osseous neural foraminal or central canal stenosis. Severe multilevel intervertebral disc space narrowing. No acute fracture. No aggressive appearing focal osseous lesion or focal pathologic process. Soft tissues and spinal canal: No prevertebral fluid or swelling. No visible canal hematoma. Upper chest: Right upper lobe airspace opacity. Please see CT chest, abdomen, pelvis 10/30/2021. Other: None. IMPRESSION: 1. No acute intracranial abnormality. 2. No acute displaced fracture or traumatic listhesis  of the cervical spine. Electronically Signed   By: Iven Finn M.D.   On: 10/30/2021 23:32   DG Knee Left Port  Result Date: 10/30/2021 CLINICAL DATA:  Blunt trauma, fall. EXAM: PORTABLE LEFT KNEE - 1-2 VIEW COMPARISON:  None Available. FINDINGS: No evidence of fracture, dislocation, or joint effusion. Mild-to-moderate tricompartmental degenerative changes. Chondrocalcinosis is noted. Vascular calcifications are present in the soft tissues. IMPRESSION: 1. No acute fracture or dislocation. 2. Mild-to-moderate tricompartmental degenerative changes. 3. Chondrocalcinosis. Electronically Signed   By: Brett Fairy M.D.   On: 10/30/2021 23:10   DG Chest Port 1 View  Result Date: 10/30/2021 CLINICAL DATA:  Trauma. EXAM: PORTABLE CHEST 1 VIEW COMPARISON:  Chest x-ray 10/08/2021.  CT of the chest 10/08/2021. FINDINGS: Aorta is ectatic, unchanged. The heart is enlarged similar to the prior study. Small right pleural effusion is  partially loculated. There are minimal strandy opacities at the left lung base favored is atelectasis. No evidence for pneumothorax. No acute fractures are seen. IMPRESSION: Small right pleural effusion, partially loculated. Stable cardiomegaly. Electronically Signed   By: Ronney Asters M.D.   On: 10/30/2021 23:07   DG Pelvis Portable  Result Date: 10/30/2021 CLINICAL DATA:  Trauma. EXAM: PORTABLE PELVIS 1-2 VIEWS COMPARISON:  Pelvis x-ray 10/08/2021 FINDINGS: Patient's hand overlies the right pelvis. The bones are osteopenic. There is no definite acute fracture or dislocation. Joint spaces are maintained. L5-S1 fusion hardware is visualized. Peripheral vascular calcifications are present. IMPRESSION: 1. Limited evaluation of the right iliac bone secondary to overlying artifact. No definite acute fracture or dislocation. Electronically Signed   By: Ronney Asters M.D.   On: 10/30/2021 23:06    EKG: Independently reviewed.  A-fib with RVR, LAFB, RBBB, QTc 511.  Assessment and Plan  A-fib with RVR Likely precipitated by underlying pulmonary issue/PE with large clot burden.  Rate improved after IV Cardizem. -Continue Cardizem infusion -Started on IV heparin given PE with large clot burden -Check TSH  PE with large clot burden Acute hypoxemic respiratory failure Initially diagnosed on CT done last month 8/7 and was deemed not a candidate for thrombolysis.  She was discharged on Eliquis.  Repeat CTA done today showing similar appearing occlusive right main pulmonary embolus extending to the right middle and right lower lobes.  Persistent, slightly decreased in size, left upper lobe and left lower lobe segmental and subsegmental pulmonary emboli.  Possible right upper lobe subsegmental pulmonary embolus.  Persistent enlarged right atrium with possible underlying right heart strain.  She is in A-fib with RVR but not hypotensive.  Sats in the 80 but difficult to get accurate pulse ox readings as patient is  constantly moving.  Placed on 3 L supplemental oxygen. -Stat blood gas -Continue IV heparin -Continue supplemental oxygen -Patient had previously declined echocardiogram. -Check BNP and troponin -PCCM consulted  Possible lung infection/ ?sepsis CT showing interval development of a masslike right apical airspace opacity at the subjacent 7 mm pulmonary nodule.  ?Infection.  She is tachycardic but does not meet any other SIRS criteria at this time.  WBC 11.3.  COVID and influenza PCR negative. -Continue ceftriaxone and azithromycin -No blood cultures drawn in the ED -Check procalcitonin level  Acute metabolic encephalopathy Likely precipitated by problems listed above.  UA without signs of infection.  CT head negative for acute finding.  She is moving all extremities spontaneously, no focal weakness.  No meningeal signs. -Check TSH, B12, ammonia  Left hip hematoma Presented after a fall at her facility.  CT showing a partially visualized at least 7.5 x 7.2 x 8.4 cm left hip subcutaneus soft tissue hematoma.  Hemoglobin stable since recent hospital discharge. -On IV heparin for anticoagulation given PE, monitor closely -Monitor H&H  Chronic systolic CHF (EF 40 to 91% per echo done 07/2021) Has peripheral edema.  CT showing interval increase in size of trace volume left pleural effusion. -Check BNP  Normocytic anemia -Hemoglobin currently stable, continue to monitor as she is on anticoagulation and has a hematoma.  QT prolongation -Cardiac monitoring -Keep K>4 and Mag >2 -Avoid QT prolonging drugs if possible  DVT prophylaxis: IV heparin gtt Code Status: DNR   Family Communication: No family available at this time. Consults called: PCCM Level of care: Progressive Care Unit Admission status: It is my clinical opinion that admission to INPATIENT is reasonable and necessary because of the expectation that this patient will require hospital care that crosses at least 2 midnights to  treat this condition based on the medical complexity of the problems presented.  Given the aforementioned information, the predictability of an adverse outcome is felt to be significant.   Shela Leff MD [facility]  If 7PM-7AM, please contact night-coverage [email or web address]  10/31/2021, 2:57 AM

## 2021-10-31 NOTE — ED Notes (Signed)
Patient will continue to yell when care is being performed, but patient is currently ok after being provided warm blankets.

## 2021-10-31 NOTE — ED Notes (Signed)
Brendi Mccarroll daughter in law [phone] requesting an update

## 2021-10-31 NOTE — Progress Notes (Addendum)
ANTICOAGULATION CONSULT NOTE - Initial Consult  Pharmacy Consult for Heparin Indication: pulmonary embolus  Allergies  Allergen Reactions   Amoxil [Amoxicillin] Nausea Only    NOT ON MAR   Codeine Nausea Only    NOT ON MAR   Aricept [Donepezil Hcl] Other (See Comments)    "Felt drunk" Headaches Drowsiness  NOT ON MAR   Catapres [Clonidine] Other (See Comments)    Dry mouth  NOT ON MAR   Hycodan [Hydrocodone Bit-Homatrop Mbr] Diarrhea and Nausea And Vomiting    NOT ON MAR   Norco [Hydrocodone-Acetaminophen] Diarrhea and Nausea And Vomiting   Norvasc [Amlodipine] Swelling    NOT ON MAR   Prolia [Denosumab] Other (See Comments)    Unknown reaction NOT ON MAR   Ultram [Tramadol] Nausea Only    NOT ON MAR   Zoloft [Sertraline] Nausea Only    NOT ON MAR   Clavulanic Acid Hives and Rash    NOT ON MAR    Patient Measurements: Height: '5\' 4"'  (162.6 cm) Weight: 56.5 kg (124 lb 9 oz) IBW/kg (Calculated) : 54.7   Vital Signs: Temp: 96.2 F (35.7 C) (09/16 2259) Temp Source: Axillary (09/16 2259) BP: 112/87 (09/17 0100) Pulse Rate: 38 (09/17 0100)  Labs: Recent Labs    10/30/21 2246 10/30/21 2308  HGB 9.8* 11.2*  HCT 30.5* 33.0*  PLT 440*  --   LABPROT 16.7*  --   INR 1.4*  --   CREATININE 0.92 0.70  CKTOTAL 81  --   CKMB 6.1*  --     Estimated Creatinine Clearance: 41.2 mL/min (by C-G formula based on SCr of 0.7 mg/dL).   Medical History: Past Medical History:  Diagnosis Date   Blood transfusion    Breast cancer, left ([place]) 09/26/2011   Punch biopsies of the left nipple done on 09/16/2011.  ER and PR were negative.  HER2 is positive with ratio 4.68.Had mastectomy on 10/25/11. Path T1,N0.    Chronic low back pain    Dyslipidemia    GERD (gastroesophageal reflux disease)    HTN (hypertension)    Osteoarthritis    Osteoporosis      Assessment: 88 year old female presenting from facility due to altered mental status and unwitnessed fall approximately  5 hours prior to arrival. Head CT clear of bleed. Patient with history of a fib and bilateral PE/right lower extremity DVT diagnosed 09/2021 on Eliquis.  Pharmacy asked to start Heparin for recent PE diagnosis. Last Eliquis dose not known.  Will monitor aPTT and heparin level due to recent eliquis.  Goal of Therapy:  Heparin level 0.3-0.7 units/ml Monitor platelets by anticoagulation protocol: Yes   Plan:  Start heparin infusion at 900 units/hr Check anti-Xa level and aPTT in 8 hours and daily while on heparin Continue to monitor H&H and platelets  Alanda Slim, PharmD, [facility] Clinical Pharmacist Please see AMION for all Pharmacists' Contact Phone Numbers 10/31/2021, 2:40 AM

## 2021-10-31 NOTE — Progress Notes (Signed)
PROGRESS NOTE  Judy [name]:[id number] DOB: Dec 29, 1930 DOA: 10/30/2021 PCP: Pcp, No  HPI/Recap of past 24 hours: Judy [name] is a 88 y.o. female with medical history significant of dementia, paroxysmal A-fib, chronic systolic CHF (EF 40 to 12% per echo done 07/2021), breast cancer status post left breast mastectomy, chronic low back pain, hyperlipidemia, GERD, hypertension, osteoporosis, osteoarthritis.  She was admitted to the hospital last month 8/7-8/10 for large central and subsegmental bilateral PE and was not a candidate for thrombolysis.  Work-up also revealed right lower extremity DVT.  Patient had declined echocardiogram.  She was discharged on Eliquis. Readmitted 8/25-8/29 after a fall at her facility and found to be in A-fib with RVR.  Hospital course complicated by encephalopathy with agitation and periods of lethargy.  Foley catheter was placed for urinary retention.  She was also treated for a UTI.  She had a moderate right pleural effusion in setting of history of breast cancer, family decided not to pursue thoracentesis.  Palliative care was consulted and she was discharged to her facility with hospice care. Pt returns to the ED after a fall at her facility. On EMS arrival she was screaming, moving all extremities, and not following any commands.  She had a scalp hematoma.  Patient noted to be in A-fib with RVR with rate in the 120s to 160s. COVID and influenza PCR negative, UA without signs of infection.  Pelvic x-ray, left knee x-ray, and CT head/C-spine negative for acute traumatic injuries.  Patient admitted for further management.  PCCM consulted.    Today, unable to ROS with patient due to significant lethargy, encephalopathy, appears to be in pain, constantly moaning when touched.  Patient not following commands.    Assessment/Plan: Principal Problem:   Atrial flutter with rapid ventricular response (HCC) Active Problems:   Pulmonary emboli (HCC)   Acute metabolic  encephalopathy   Hematoma   CHF (congestive heart failure) (HCC)    Paroxysmal A-fib with RVR Rate uncontrolled (but patient persistently moving so questionable accuracy) Continue Cardizem infusion Continue IV heparin   PE with large clot burden Acute hypoxemic respiratory failure CTA done showed similar appearing occlusive right main pulmonary embolus extending to the right middle and right lower lobes PCCM consulted, appreciate recs Continue IV heparin Continue supplemental oxygen Patient had previously declined echocardiogram   Possible CAP CT showing interval development of a masslike right apical airspace opacity at the subjacent 7 mm pulmonary nodule. ?Infection Procalcitonin 0.19 No blood cultures drawn in the ED Continue ceftriaxone and azithromycin  Left hip hematoma Presented after a fall at her facility CT showing a partially visualized at least 7.5 x 7.2 x 8.4 cm left hip subcutaneus soft tissue hematoma Hemoglobin stable since recent hospital discharge. On IV heparin for anticoagulation given PE, monitor closely Daily CBC  Acute metabolic encephalopathy Likely multifactorial, by problems listed above, no obvious focal neurologic deficits noted UA without signs of infection CT head negative for acute finding B12, ammonia both WNL TSH elevated at 8.147, free T4 pending   Chronic systolic CHF (EF 40 to 75% per echo done 07/2021) BNP 1,157 Has peripheral edema CT showing interval increase in size of trace volume left pleural effusion Family declined thoracentesis, high risk with patient on heparin with PE only noted to be trace amount   Normocytic anemia Hemoglobin currently stable Continue to monitor as she is on anticoagulation and has a hematoma.   QT prolongation Keep K>4 and Mag >2 Avoid QT prolonging drugs  if possible Telemetry  Goals of care discussion Discussed with hospice, patient's daughter-in-law, about multiple comorbidities and very poor  prognosis of patient, they would like to discuss and consider residential hospice      Estimated body mass index is 21.38 kg/m as calculated from the following:   Height as of this encounter: '5\' 4"'$  (1.626 m).   Weight as of this encounter: 56.5 kg.     Code Status: DNR  Family Communication: Discussed with daughter-in-law  Disposition Plan: Status is: Inpatient Remains inpatient appropriate because: Level of care      Consultants: PCCM  Procedures: None  Antimicrobials: Ceftriaxone Azithromycin  DVT prophylaxis: Heparin   Objective: Vitals:   10/31/21 1245 10/31/21 1345 10/31/21 1414 10/31/21 1430  BP: (!) 139/102 (!) 151/90  (!) 131/104  Pulse: (!) 106 (!) 106  (!) 102  Resp: 18 (!) 25  18  Temp:   (!) 97.2 F (36.2 C)   TempSrc:   Axillary   SpO2: 96% 96%  97%  Weight:      Height:       No intake or output data in the 24 hours ending 10/31/21 1456 Filed Weights   10/30/21 2255  Weight: 56.5 kg    Exam: General: Appears uncomfortable, moaning, unable to communicate, lethargic Cardiovascular: S1, S2 present Respiratory: CTAB Abdomen: Soft, nontender, nondistended, bowel sounds present Musculoskeletal: 2+ bilateral pedal edema noted Skin: Noted some erythema on bilateral lower extremity Psychiatry: Unable to assess    Data Reviewed: CBC: Recent Labs  Lab 10/30/21 2246 10/30/21 2308 10/31/21 0435 10/31/21 0452  WBC 11.3*  --  10.9*  --   HGB 9.8* 11.2* 9.7* 9.5*  HCT 30.5* 33.0* 32.6* 28.0*  MCV 94.7  --  101.2*  --   PLT 440*  --  394  --    Basic Metabolic Panel: Recent Labs  Lab 10/30/21 2246 10/30/21 2308 10/31/21 0435 10/31/21 0452  NA 141 141 142 140  K 3.6 3.8 3.7 3.5  CL 107 106 105  --   CO2 24  --  22  --   GLUCOSE 91 85 93  --   BUN 27* 34* 26*  --   CREATININE 0.92 0.70 0.78  --   CALCIUM 8.7*  --  8.9  --   MG  --   --  1.9  --    GFR: Estimated Creatinine Clearance: 41.2 mL/min (by C-G formula based on SCr  of 0.78 mg/dL). Liver Function Tests: Recent Labs  Lab 10/30/21 2246  AST 33  ALT 20  ALKPHOS 111  BILITOT 1.3*  PROT 5.7*  ALBUMIN 2.4*   No results for input(s): "LIPASE", "AMYLASE" in the last 168 hours. Recent Labs  Lab 10/31/21 0435  AMMONIA 12   Coagulation Profile: Recent Labs  Lab 10/30/21 2246  INR 1.4*   Cardiac Enzymes: Recent Labs  Lab 10/30/21 2246  CKTOTAL 81  CKMB 6.1*   BNP (last 3 results) No results for input(s): "PROBNP" in the last 8760 hours. HbA1C: No results for input(s): "HGBA1C" in the last 72 hours. CBG: No results for input(s): "GLUCAP" in the last 168 hours. Lipid Profile: No results for input(s): "CHOL", "HDL", "LDLCALC", "TRIG", "CHOLHDL", "LDLDIRECT" in the last 72 hours. Thyroid Function Tests: Recent Labs    10/31/21 0435  TSH 8.147*   Anemia Panel: Recent Labs    10/31/21 0435  VITAMINB12 1,515*   Urine analysis:    Component Value Date/Time   COLORURINE AMBER (A) 10/31/2021  [place] 10/31/2021 0132   LABSPEC 1.032 (H) 10/31/2021 0132   PHURINE 5.0 10/31/2021 0132   GLUCOSEU NEGATIVE 10/31/2021 0132   HGBUR NEGATIVE 10/31/2021 0132   BILIRUBINUR NEGATIVE 10/31/2021 0132   KETONESUR NEGATIVE 10/31/2021 0132   PROTEINUR NEGATIVE 10/31/2021 0132   NITRITE NEGATIVE 10/31/2021 0132   LEUKOCYTESUR NEGATIVE 10/31/2021 0132   Sepsis Labs: '@LABRCNTIP'$ (procalcitonin:4,lacticidven:4)  ) Recent Results (from the past 240 hour(s))  Resp Panel by RT-PCR (Flu A&B, Covid) Anterior Nasal Swab     Status: None   Collection Time: 10/30/21 10:58 PM   Specimen: Anterior Nasal Swab  Result Value Ref Range Status   SARS Coronavirus 2 by RT PCR NEGATIVE NEGATIVE Final    Comment: (NOTE) SARS-CoV-2 target nucleic acids are NOT DETECTED.  The SARS-CoV-2 RNA is generally detectable in upper respiratory specimens during the acute phase of infection. The lowest concentration of SARS-CoV-2 viral copies this assay can  detect is 138 copies/mL. A negative result does not preclude SARS-Cov-2 infection and should not be used as the sole basis for treatment or other patient management decisions. A negative result may occur with  improper specimen collection/handling, submission of specimen other than nasopharyngeal swab, presence of viral mutation(s) within the areas targeted by this assay, and inadequate number of viral copies(<138 copies/mL). A negative result must be combined with clinical observations, patient history, and epidemiological information. The expected result is Negative.  Fact Sheet for Patients:  EntrepreneurPulse.com.au  Fact Sheet for Healthcare Providers:  IncredibleEmployment.be  This test is no t yet approved or cleared by the [place] FDA and  has been authorized for detection and/or diagnosis of SARS-CoV-2 by FDA under an Emergency Use Authorization (EUA). This EUA will remain  in effect (meaning this test can be used) for the duration of the COVID-19 declaration under Section 564(b)(1) of the Act, 21 U.S.C.section 360bbb-3(b)(1), unless the authorization is terminated  or revoked sooner.       Influenza A by PCR NEGATIVE NEGATIVE Final   Influenza B by PCR NEGATIVE NEGATIVE Final    Comment: (NOTE) The Xpert Xpress SARS-CoV-2/FLU/RSV plus assay is intended as an aid in the diagnosis of influenza from Nasopharyngeal swab specimens and should not be used as a sole basis for treatment. Nasal washings and aspirates are unacceptable for Xpert Xpress SARS-CoV-2/FLU/RSV testing.  Fact Sheet for Patients: EntrepreneurPulse.com.au  Fact Sheet for Healthcare Providers: IncredibleEmployment.be  This test is not yet approved or cleared by the [place] FDA and has been authorized for detection and/or diagnosis of SARS-CoV-2 by FDA under an Emergency Use Authorization (EUA). This EUA will remain in  effect (meaning this test can be used) for the duration of the COVID-19 declaration under Section 564(b)(1) of the Act, 21 U.S.C. section 360bbb-3(b)(1), unless the authorization is terminated or revoked.  Performed at [facility], [place] [address] [place], [place] [address]   Respiratory (~20 pathogens) panel by PCR     Status: None   Collection Time: 10/31/21 11:20 AM   Specimen: Nasopharyngeal Swab; Respiratory  Result Value Ref Range Status   Adenovirus NOT DETECTED NOT DETECTED Final   Coronavirus 229E NOT DETECTED NOT DETECTED Final    Comment: (NOTE) The Coronavirus on the Respiratory Panel, DOES NOT test for the novel  Coronavirus (2019 nCoV)    Coronavirus HKU1 NOT DETECTED NOT DETECTED Final   Coronavirus NL63 NOT DETECTED NOT DETECTED Final   Coronavirus OC43 NOT DETECTED NOT DETECTED Final   Metapneumovirus NOT  DETECTED NOT DETECTED Final   Rhinovirus / Enterovirus NOT DETECTED NOT DETECTED Final   Influenza A NOT DETECTED NOT DETECTED Final   Influenza B NOT DETECTED NOT DETECTED Final   Parainfluenza Virus 1 NOT DETECTED NOT DETECTED Final   Parainfluenza Virus 2 NOT DETECTED NOT DETECTED Final   Parainfluenza Virus 3 NOT DETECTED NOT DETECTED Final   Parainfluenza Virus 4 NOT DETECTED NOT DETECTED Final   Respiratory Syncytial Virus NOT DETECTED NOT DETECTED Final   Bordetella pertussis NOT DETECTED NOT DETECTED Final   Bordetella Parapertussis NOT DETECTED NOT DETECTED Final   Chlamydophila pneumoniae NOT DETECTED NOT DETECTED Final   Mycoplasma pneumoniae NOT DETECTED NOT DETECTED Final    Comment: Performed at [facility], [place] [address], [place], [place] [address]      Studies: CT CHEST ABDOMEN PELVIS W CONTRAST  Addendum Date: 10/31/2021   ADDENDUM REPORT: 10/31/2021 00:06 ADDENDUM: These results were called by telephone at the time of interpretation on 10/31/2021 at 12:06 am to provider Dr. Matilde Sprang, who verbally acknowledged these results.  Electronically Signed   By: Iven Finn M.D.   On: 10/31/2021 00:06   Result Date: 10/31/2021 CLINICAL DATA:  Polytrauma, blunt [id number] EXAM: CT CHEST, ABDOMEN, AND PELVIS WITH CONTRAST TECHNIQUE: Multidetector CT imaging of the chest, abdomen and pelvis was performed following the standard protocol during bolus administration of intravenous contrast. RADIATION DOSE REDUCTION: This exam was performed according to the departmental dose-optimization program which includes automated exposure control, adjustment of the mA and/or kV according to patient size and/or use of iterative reconstruction technique. CONTRAST:  51m OMNIPAQUE IOHEXOL 350 MG/ML SOLN COMPARISON:  CT chest, abdomen, pelvis 10/08/2021 FINDINGS: CHEST: Cardiovascular: No aortic injury. The thoracic aorta is normal in caliber. The heart is normal in size. No significant pericardial effusion. The main pulmonary artery is borderline enlarged in caliber measuring up 3.1 cm. Similar-appearing occlusive right main pulmonary embolus extending to the right middle and right lower lobes. Possible right upper lobe subsegmental pulmonary embolus. Persistent, slightly decreased in size, left upper lobe and left lower lobe segmental and subsegmental pulmonary emboli. Limited evaluation due to timing of contrast. Mediastinum/Nodes: No pneumomediastinum. No mediastinal hematoma. The esophagus is unremarkable. The thyroid is unremarkable. The central airways are patent. No mediastinal, hilar, or axillary lymphadenopathy. Lungs/Pleura: Interval development of a masslike slightly bubbly right apical airspace opacity. Interval development of a subjacent right upper lobe 7 mm pulmonary nodule (5:29). Interval development of right middle lobe interlobular and ground-glass airspace opacity (5:62). Similar-appearing left upper lobe anterior peripheral reticulations. No pulmonary contusion or laceration. No pneumatocele formation. Interval decrease in size of a small to  moderate volume right pleural effusion. Interval increase in size of a trace volume left pleural effusion. No pneumothorax. No hemothorax. Musculoskeletal/Chest wall: Similar-appearing 7.5 x 2.8 cm left breast mass density likely representing an implant. Diffuse subcutaneus soft tissue edema. No acute rib or sternal fracture. Old right posterior ninth and tenth rib fractures. No spinal fracture.  Multilevel degenerative changes spine. Degenerative changes of bilateral shoulders. ABDOMEN / PELVIS: Hepatobiliary: Not enlarged. No focal lesion. No laceration or subcapsular hematoma. Layering hyperdensity within the gallbladder lumen likely gallstones. No gallbladder wall thickening or pericholecystic fluid. No biliary ductal dilatation. Pancreas: Atrophic. Otherwise normal pancreatic contour. No main pancreatic duct dilatation. Spleen: Not enlarged. No focal lesion. No laceration, subcapsular hematoma, or vascular injury. Adrenals/Urinary Tract: No nodularity bilaterally. Bilateral kidneys enhance symmetrically. 5 mm calcification within the left kidney. 2 mm calcification within  the right kidney. No ureterolithiasis. No hydronephrosis. No contusion, laceration, or subcapsular hematoma. No injury to the vascular structures or collecting systems. No hydroureter. The urinary bladder is unremarkable. Stomach/Bowel: No small or large bowel wall thickening or dilatation. Colonic diverticulosis. The appendix is not definitely identified with no inflammatory changes in the right lower quadrant to suggest acute appendicitis. Vasculature/Lymphatics: Atherosclerotic plaque. No abdominal aorta or iliac aneurysm. No active contrast extravasation or pseudoaneurysm. No abdominal, pelvic, inguinal lymphadenopathy. Reproductive: Status post hysterectomy. No bilateral adnexal masses. Other: Nonspecific trace free fluid within the pelvis. No pneumoperitoneum. No hemoperitoneum. No mesenteric hematoma identified. No organized fluid  collection. Musculoskeletal: Left hip 7.5 x 7.2 x at least 8.4 cm hyperdense subcutaneus soft tissue lesion likely representing a hematoma. Diffuse subcutaneus soft tissue edema. No acute pelvic fracture. No spinal fracture. L5-S1 posterolateral fusion surgical hardware. Multilevel degenerative changes of the spine. Ports and Devices: None. IMPRESSION: 1. Similar-appearing occlusive right main pulmonary embolus extending to the right middle and right lower lobes. Persistent, slightly decreased in size, left upper lobe and left lower lobe segmental and subsegmental pulmonary emboli. Possible right upper lobe subsegmental pulmonary embolus. Limited evaluation due to timing of contrast. Persistent enlarged right atrium with possible underlying right heart strain. 2. Interval development of a masslike right apical airspace opacity with a subjacent 7 mm pulmonary nodule. Finding may represent infection/inflammation. Pulmonary infarction not excluded. Recommend follow-up CT in 3 months to evaluate for resolution. 3. Partially visualized at least 7.5 x 7.2 x 8.4 cm left hip subcutaneus soft tissue hematoma. 4. No acute traumatic injury to the chest, abdomen, or pelvis. 5. No acute fracture or traumatic malalignment of the thoracic or lumbar spine. Other imaging findings of potential clinical significance: 1. Interval decrease in size of a small to moderate volume right pleural effusion. 2. Interval increase in size of a trace volume left pleural effusion. 3. Similar-appearing 7.5 x 2.8 cm left breast mass density likely representing an implant. Recommend correlation with clinical history. 4. Nonobstructive bilateral nephrolithiasis. 5. Cholelithiasis with no acute cholecystitis. 6. Colonic diverticulosis with no acute diverticulitis. 7. Anasarca. Electronically Signed: By: Iven Finn M.D. On: 10/31/2021 00:01   CT HEAD WO CONTRAST  Result Date: 10/30/2021 CLINICAL DATA:  Head trauma, moderate-severe; Polytrauma,  blunt EXAM: CT HEAD WITHOUT CONTRAST CT CERVICAL SPINE WITHOUT CONTRAST TECHNIQUE: Multidetector CT imaging of the head and cervical spine was performed following the standard protocol without intravenous contrast. Multiplanar CT image reconstructions of the cervical spine were also generated. RADIATION DOSE REDUCTION: This exam was performed according to the departmental dose-optimization program which includes automated exposure control, adjustment of the mA and/or kV according to patient size and/or use of iterative reconstruction technique. COMPARISON:  None Available. FINDINGS: CT HEAD FINDINGS BRAIN: BRAIN Cerebral ventricle sizes are concordant with the degree of cerebral volume loss. Patchy and confluent areas of decreased attenuation are noted throughout the deep and periventricular white matter of the cerebral hemispheres bilaterally, compatible with chronic microvascular ischemic disease. No evidence of large-territorial acute infarction. No parenchymal hemorrhage. No mass lesion. No extra-axial collection. No mass effect or midline shift. No hydrocephalus. Basilar cisterns are patent. Vascular: No hyperdense vessel. Atherosclerotic calcifications are present within the cavernous internal carotid and vertebral arteries. Skull: No acute fracture or focal lesion. Sinuses/Orbits: Paranasal sinuses and mastoid air cells are clear. Bilateral lens replacement. Otherwise the orbits are unremarkable. Other: Left parieto-occipital 6 mm scalp hematoma formation. CT CERVICAL SPINE FINDINGS Alignment: Straightening of the normal cervical lordosis likely  due to positioning and degenerative changes. Grade 1 anterolisthesis of C3 on C4, C4 on C5, C7 on T1. Skull base and vertebrae: Multilevel severe degenerative changes of the spine. No severe osseous neural foraminal or central canal stenosis. Severe multilevel intervertebral disc space narrowing. No acute fracture. No aggressive appearing focal osseous lesion or focal  pathologic process. Soft tissues and spinal canal: No prevertebral fluid or swelling. No visible canal hematoma. Upper chest: Right upper lobe airspace opacity. Please see CT chest, abdomen, pelvis 10/30/2021. Other: None. IMPRESSION: 1. No acute intracranial abnormality. 2. No acute displaced fracture or traumatic listhesis of the cervical spine. Electronically Signed   By: Iven Finn M.D.   On: 10/30/2021 23:32   CT CERVICAL SPINE WO CONTRAST  Result Date: 10/30/2021 CLINICAL DATA:  Head trauma, moderate-severe; Polytrauma, blunt EXAM: CT HEAD WITHOUT CONTRAST CT CERVICAL SPINE WITHOUT CONTRAST TECHNIQUE: Multidetector CT imaging of the head and cervical spine was performed following the standard protocol without intravenous contrast. Multiplanar CT image reconstructions of the cervical spine were also generated. RADIATION DOSE REDUCTION: This exam was performed according to the departmental dose-optimization program which includes automated exposure control, adjustment of the mA and/or kV according to patient size and/or use of iterative reconstruction technique. COMPARISON:  None Available. FINDINGS: CT HEAD FINDINGS BRAIN: BRAIN Cerebral ventricle sizes are concordant with the degree of cerebral volume loss. Patchy and confluent areas of decreased attenuation are noted throughout the deep and periventricular white matter of the cerebral hemispheres bilaterally, compatible with chronic microvascular ischemic disease. No evidence of large-territorial acute infarction. No parenchymal hemorrhage. No mass lesion. No extra-axial collection. No mass effect or midline shift. No hydrocephalus. Basilar cisterns are patent. Vascular: No hyperdense vessel. Atherosclerotic calcifications are present within the cavernous internal carotid and vertebral arteries. Skull: No acute fracture or focal lesion. Sinuses/Orbits: Paranasal sinuses and mastoid air cells are clear. Bilateral lens replacement. Otherwise the orbits  are unremarkable. Other: Left parieto-occipital 6 mm scalp hematoma formation. CT CERVICAL SPINE FINDINGS Alignment: Straightening of the normal cervical lordosis likely due to positioning and degenerative changes. Grade 1 anterolisthesis of C3 on C4, C4 on C5, C7 on T1. Skull base and vertebrae: Multilevel severe degenerative changes of the spine. No severe osseous neural foraminal or central canal stenosis. Severe multilevel intervertebral disc space narrowing. No acute fracture. No aggressive appearing focal osseous lesion or focal pathologic process. Soft tissues and spinal canal: No prevertebral fluid or swelling. No visible canal hematoma. Upper chest: Right upper lobe airspace opacity. Please see CT chest, abdomen, pelvis 10/30/2021. Other: None. IMPRESSION: 1. No acute intracranial abnormality. 2. No acute displaced fracture or traumatic listhesis of the cervical spine. Electronically Signed   By: Iven Finn M.D.   On: 10/30/2021 23:32   DG Knee Left Port  Result Date: 10/30/2021 CLINICAL DATA:  Blunt trauma, fall. EXAM: PORTABLE LEFT KNEE - 1-2 VIEW COMPARISON:  None Available. FINDINGS: No evidence of fracture, dislocation, or joint effusion. Mild-to-moderate tricompartmental degenerative changes. Chondrocalcinosis is noted. Vascular calcifications are present in the soft tissues. IMPRESSION: 1. No acute fracture or dislocation. 2. Mild-to-moderate tricompartmental degenerative changes. 3. Chondrocalcinosis. Electronically Signed   By: Brett Fairy M.D.   On: 10/30/2021 23:10   DG Chest Port 1 View  Result Date: 10/30/2021 CLINICAL DATA:  Trauma. EXAM: PORTABLE CHEST 1 VIEW COMPARISON:  Chest x-ray 10/08/2021.  CT of the chest 10/08/2021. FINDINGS: Aorta is ectatic, unchanged. The heart is enlarged similar to the prior study. Small right pleural effusion is  partially loculated. There are minimal strandy opacities at the left lung base favored is atelectasis. No evidence for pneumothorax. No  acute fractures are seen. IMPRESSION: Small right pleural effusion, partially loculated. Stable cardiomegaly. Electronically Signed   By: Ronney Asters M.D.   On: 10/30/2021 23:07   DG Pelvis Portable  Result Date: 10/30/2021 CLINICAL DATA:  Trauma. EXAM: PORTABLE PELVIS 1-2 VIEWS COMPARISON:  Pelvis x-ray 10/08/2021 FINDINGS: Patient's hand overlies the right pelvis. The bones are osteopenic. There is no definite acute fracture or dislocation. Joint spaces are maintained. L5-S1 fusion hardware is visualized. Peripheral vascular calcifications are present. IMPRESSION: 1. Limited evaluation of the right iliac bone secondary to overlying artifact. No definite acute fracture or dislocation. Electronically Signed   By: Ronney Asters M.D.   On: 10/30/2021 23:06    Scheduled Meds:  Continuous Infusions:  [START ON 11/01/2021] azithromycin (ZITHROMAX) 500 mg in sodium chloride 0.9 % 250 mL IVPB     [START ON 11/01/2021] cefTRIAXone (ROCEPHIN)  IV     diltiazem (CARDIZEM) infusion 10 mg/hr (10/31/21 1414)   heparin 850 Units/hr (10/31/21 1415)     LOS: 0 days     Belkys Friendly, MD [facility]  If 7PM-7AM, please contact night-coverage [email or web address] 10/31/2021, 2:56 PM

## 2021-10-31 NOTE — ED Notes (Signed)
Did not get blood clicked off wrong pt blood work.

## 2021-10-31 NOTE — Consult Note (Signed)
NAME:  Judy [name], MRN:  [id number], DOB:  [birth year], LOS: 0 ADMISSION DATE:  10/30/2021, CONSULTATION DATE:  10/31/21 REFERRING MD:  Marlowe Sax, MD CHIEF COMPLAINT:  respiratory failure   History of Present Illness:  52F with dementia, paroxysmal atrial fibrillation, HFrEF, breast cancer s/p left mastectomy and recent admission 8/7-8/10 for submassive pulmonary emboli involving the right main pulmonary artery where she was started on eliquis therapy. She was admitted 8/25-8/29 after a fall and atrial fibrillation with RVR. She was treated for UTI as well. She had a moderate right pleural effusion in setting of history of breast cancer, family decided not to pursue thoracentesis.  Palliative care was consulted and she was discharged to her facility with hospice care.  She returned to [facility] ER 9/16 with altered mental status and a fall at her facility. She was noted to be in atrial fibrillation with RVR. She had oxygen saturations in the 80s which responded to supplemental oxygen via nasal canula.   CT Chest imaging showed pulmonary emboli, slightly reduced in size compared to prior exams. New right upper lobe opacity and right middle lobe ground glass opacity. Decrease in right pleural effusion.  PCCM consulted regarding the pulmonary emboli and right pleural effusion.  Pertinent  Medical History   Past Medical History:  Diagnosis Date   Blood transfusion    Breast cancer, left ([place]) 09/26/2011   Punch biopsies of the left nipple done on 09/16/2011.  ER and PR were negative.  HER2 is positive with ratio 4.68.Had mastectomy on 10/25/11. Path T1,N0.    Chronic low back pain    Dyslipidemia    GERD (gastroesophageal reflux disease)    HTN (hypertension)    Osteoarthritis    Osteoporosis    Significant Hospital Events: Including procedures, antibiotic start and stop dates in addition to other pertinent events   9/17 admitted   Interim History / Subjective:   Patient not wearing nasal canula  at time of visit, SpO2 97%  Objective   Blood pressure (!) 136/93, pulse (!) 109, temperature (!) 97.5 F (36.4 C), temperature source Axillary, resp. rate (!) 25, height '5\' 4"'  (1.626 m), weight 56.5 kg, SpO2 97 %.       No intake or output data in the 24 hours ending 10/31/21 1058 Filed Weights   10/30/21 2255  Weight: 56.5 kg    Examination: General: elderly woman, no acute distress, laying in bed HENT: sclera anicteric, moist mucous membranes Lungs: diminished breath sounds, no wheezing Cardiovascular: tachycardic, irregularly irregular Abdomen: soft, non-tender, non-distended, BS+ Extremities: warm, 1+ edema Neuro: alert but not oriented, not following commands, moving all 4 extremities spontaneously GU: n/a  Resolved Hospital Problem list     Assessment & Plan:  Pulmonary Emboli Sepsis due to Pneumonia Atrial Fibrillation with RVR Acute Encephalopathy on chronic dementia Right Pleural Effusion  Plan - Continue heparin drip for anticoagulation for treatment of PE, transition back to eliquis to complete at least 3 months of therapy. She is high risk for complications on anticoagulation given her fall history. - Check ECHO.  - Check MRSA pcr screen, urine legionella/strep pneumoniae antigens and extended respiratory viral panel - Continue ceftriaxone + azithromycin, add vancomycin if MRSA screen positive - No plans for thoracentesis at this time as it was previously declined by family and she is pending hospice care - She was on room air during my time of exam with normal O2 saturations   PCCM will continue to follow  Best Practice (right click  and "Reselect all SmartList Selections" daily)   Per primary  Labs   CBC: Recent Labs  Lab 10/30/21 2246 10/30/21 2308 10/31/21 0435 10/31/21 0452  WBC 11.3*  --  10.9*  --   HGB 9.8* 11.2* 9.7* 9.5*  HCT 30.5* 33.0* 32.6* 28.0*  MCV 94.7  --  101.2*  --   PLT 440*  --  394  --     Basic Metabolic Panel: Recent  Labs  Lab 10/30/21 2246 10/30/21 2308 10/31/21 0435 10/31/21 0452  NA 141 141 142 140  K 3.6 3.8 3.7 3.5  CL 107 106 105  --   CO2 24  --  22  --   GLUCOSE 91 85 93  --   BUN 27* 34* 26*  --   CREATININE 0.92 0.70 0.78  --   CALCIUM 8.7*  --  8.9  --   MG  --   --  1.9  --    GFR: Estimated Creatinine Clearance: 41.2 mL/min (by C-G formula based on SCr of 0.78 mg/dL). Recent Labs  Lab 10/30/21 2246 10/31/21 0435  PROCALCITON  --  0.19  WBC 11.3* 10.9*  LATICACIDVEN  --  2.3*    Liver Function Tests: Recent Labs  Lab 10/30/21 2246  AST 33  ALT 20  ALKPHOS 111  BILITOT 1.3*  PROT 5.7*  ALBUMIN 2.4*   No results for input(s): "LIPASE", "AMYLASE" in the last 168 hours. Recent Labs  Lab 10/31/21 0435  AMMONIA 12    ABG    Component Value Date/Time   PHART 7.464 (H) 10/31/2021 0452   PCO2ART 32.4 10/31/2021 0452   PO2ART 95 10/31/2021 0452   HCO3 23.5 10/31/2021 0452   TCO2 25 10/31/2021 0452   O2SAT 98 10/31/2021 0452     Coagulation Profile: Recent Labs  Lab 10/30/21 2246  INR 1.4*    Cardiac Enzymes: Recent Labs  Lab 10/30/21 2246  CKTOTAL 81  CKMB 6.1*    HbA1C: No results found for: "HGBA1C"  CBG: No results for input(s): "GLUCAP" in the last 168 hours.  Review of Systems:   Unable to perform review of systems due to altered mentation.  Past Medical History:  She,  has a past medical history of Blood transfusion, Breast cancer, left ([place]) (09/26/2011), Chronic low back pain, Dyslipidemia, GERD (gastroesophageal reflux disease), HTN (hypertension), Osteoarthritis, and Osteoporosis.   Surgical History:   Past Surgical History:  Procedure Laterality Date   BACK SURGERY  2009   lumb lam   BREAST SURGERY  2009   lt lumpectomy-plus 2 re-excisions for margins   left breast mastectomy  2013   MASTECTOMY Left    ROTATOR CUFF REPAIR     Bilateral, 2010   TOTAL ABDOMINAL HYSTERECTOMY       Social History:   reports that she has  never smoked. She has never used smokeless tobacco. She reports that she does not drink alcohol and does not use drugs.   Family History:  Her family history includes Breast cancer in her sister; Cancer in her sister and sister; Colon cancer in her brother; Diabetes in her son. There is no history of Esophageal cancer, Rectal cancer, or Stomach cancer.   Allergies Allergies  Allergen Reactions   Amoxil [Amoxicillin] Nausea Only    NOT ON MAR   Codeine Nausea Only    NOT ON MAR   Aricept [Donepezil Hcl] Other (See Comments)    "Felt drunk" Headaches Drowsiness  NOT ON MAR  Catapres [Clonidine] Other (See Comments)    Dry mouth  NOT ON MAR   Hycodan [Hydrocodone Bit-Homatrop Mbr] Diarrhea and Nausea And Vomiting    NOT ON MAR   Norco [Hydrocodone-Acetaminophen] Diarrhea and Nausea And Vomiting   Norvasc [Amlodipine] Swelling    NOT ON MAR   Prolia [Denosumab] Other (See Comments)    Unknown reaction NOT ON MAR   Ultram [Tramadol] Nausea Only    NOT ON MAR   Zoloft [Sertraline] Nausea Only    NOT ON MAR   Clavulanic Acid Hives and Rash    NOT ON MAR     Home Medications  Prior to Admission medications   Medication Sig Start Date End Date Taking? Authorizing Provider  acetaminophen (TYLENOL) 500 MG tablet Take 1,000 mg by mouth every 12 (twelve) hours. May also give 1 tablet every 6 hours as needed for fever of 99.5 to 101 F, minor headache or discomfort   Yes [provider]  aluminum-magnesium hydroxide-simethicone (MAALOX) 200-200-20 MG/5ML SUSP Take 30 mLs by mouth every 6 (six) hours as needed (for constipation).   Yes [provider]  atorvastatin (LIPITOR) 10 MG tablet Take 1 tablet (10 mg total) by mouth daily at 6 PM. Patient taking differently: Take 10 mg by mouth at bedtime. 08/03/21  Yes Little Ishikawa, MD  diltiazem (CARDIZEM CD) 240 MG 24 hr capsule Take 1 capsule (240 mg total) by mouth daily. 10/12/21  Yes Regalado, Belkys A, MD   guaifenesin (ROBITUSSIN) 100 MG/5ML syrup Take 200 mg by mouth every 6 (six) hours as needed for cough.   Yes [provider]  loperamide (IMODIUM) 2 MG capsule Take 2 mg by mouth daily as needed for diarrhea or loose stools.   Yes [provider]  LORazepam (ATIVAN) 0.5 MG tablet Take 0.5 mg by mouth 2 (two) times daily. May also give 1 tablet every 6 hours as needed for anxiety or agitation 10/26/21  Yes [provider]  magnesium hydroxide (MILK OF MAGNESIA) 400 MG/5ML suspension Take 30 mLs by mouth at bedtime as needed for mild constipation.   Yes [provider]  metoprolol tartrate (LOPRESSOR) 100 MG tablet Take 1 tablet (100 mg total) by mouth 2 (two) times daily. 10/12/21  Yes Regalado, Belkys A, MD  mirtazapine (REMERON) 7.5 MG tablet Take 7.5 mg by mouth every evening.   Yes [provider]  neomycin-bacitracin-polymyxin (NEOSPORIN) [phone] ointment Apply 1 Application topically daily as needed (For minor skin tear).   Yes [provider]  polyethylene glycol powder (GLYCOLAX/MIRALAX) 17 GM/SCOOP powder Take 1 capful (17 g) with water by mouth daily. 10/12/21  Yes Regalado, Belkys A, MD  QUEtiapine (SEROQUEL) 25 MG tablet Take 1 tablet (25 mg total) by mouth at bedtime. 10/12/21  Yes Regalado, Belkys A, MD  saccharomyces boulardii (FLORASTOR) 250 MG capsule Take 250 mg by mouth daily.   Yes [provider]  traMADol (ULTRAM) 50 MG tablet Take 50 mg by mouth 3 (three) times daily.   Yes [provider]  apixaban (ELIQUIS) 5 MG TABS tablet Take 5 mg by mouth 2 (two) times daily. Patient not taking: Reported on 10/31/2021    [provider]  Omega-3 Fatty Acids (OMEGA-3 PO) Take 1 capsule by mouth daily. Patient not taking: Reported on 10/31/2021    [provider]     Critical care time: n/a    Freda Jackson, MD [place] Office: [phone]   See Amion for personal  pager PCCM on call pager [phone] until 7pm. Please call Elink 7p-7a. [phone]   ,

## 2021-10-31 NOTE — Progress Notes (Signed)
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 Please call with any questions or concerns. Thank you   Roselee Nova, [place] [facility] Liaison [phone]

## 2021-10-31 NOTE — ED Provider Notes (Signed)
  Physical Exam  BP 132/87   Pulse (!) 114   Temp (!) 96.2 F (35.7 C) (Axillary)   Resp 14   Ht '5\' 4"'$  (1.626 m)   Wt 56.5 kg   SpO2 98%   BMI 21.38 kg/m   Physical Exam Vitals and nursing note reviewed.  Constitutional:      General: She is not in acute distress.    Appearance: She is well-developed.  HENT:     Head: Normocephalic and atraumatic.  Eyes:     Conjunctiva/sclera: Conjunctivae normal.  Cardiovascular:     Rate and Rhythm: Tachycardia present. Rhythm irregular.     Heart sounds: No murmur heard. Pulmonary:     Effort: Pulmonary effort is normal. No respiratory distress.     Breath sounds: Rales present.  Abdominal:     Palpations: Abdomen is soft.     Tenderness: There is no abdominal tenderness.  Musculoskeletal:        General: No swelling.     Cervical back: Neck supple.  Skin:    General: Skin is warm and dry.     Capillary Refill: Capillary refill takes less than 2 seconds.  Neurological:     Mental Status: She is alert. She is disoriented.  Psychiatric:        Mood and Affect: Mood normal.     Procedures  .Critical Care  Performed by: Teressa Lower, MD Authorized by: Teressa Lower, MD   Critical care provider statement:    Critical care time (minutes):  30   Critical care was necessary to treat or prevent imminent or life-threatening deterioration of the following conditions:  Cardiac failure   Critical care was time spent personally by me on the following activities:  Development of treatment plan with patient or surrogate, discussions with consultants, evaluation of patient's response to treatment, examination of patient, ordering and review of laboratory studies, ordering and review of radiographic studies, ordering and performing treatments and interventions, pulse oximetry, re-evaluation of patient's condition and review of old charts   ED Course / MDM    Medical Decision Making Amount and/or Complexity of Data Reviewed Labs:  ordered. Radiology: ordered.  Risk Prescription drug management.   Patient received an handoff.  Altered mental status and fall and new onset A-fib with RVR.  Pending CT scans at time of signout.  CT imaging revealing a similar appearing occlusive right main pulmonary embolus that extends to the right middle and right lower lobes with additional stable clot burden as well as an interval development of a mass like right apical airspace opacity concerning for infection versus pulmonary infarction.  There is also an additional left hip soft tissue hematoma with no active extravasation.  Similar to previous hospital admission, I restarted diltiazem drip for her A-fib with RVR.  Heparin initiated for pulmonary embolus in the setting of need for anticoagulation but also with a soft tissue hematoma, this will allow for reversal or timely discontinuation if needed.  I then admitted the patient to the hospitalist service who requested that I speak with the pulmonary critical care physician on-call given the extensive size of the pulmonary embolus.  Spoke with Dr. Collier Bullock who will evaluate the patient and speak to hospitalist for appropriate disposition.       Teressa Lower, MD 10/31/21 [phone]

## 2021-10-31 NOTE — Progress Notes (Signed)
[facility] Liaison Note  Follow up visit made on current hospice patient who was sent to the ED post fall at her facility last pm.  Earlier discussion today was had with the patient's family on possible transition to [facility] for symptom management of pain and agitation.  One family member wanted Ms.s Ranker to remain on the heparin drip and wanted to proceed with planned hospital admission.    I met with the patient's family at the bedside to have clarify goals of care upon the request of [name], patient's sister in law.  I met with the patient's sister, brother in law, sister in law at the bedside and with patient's nephew, Montine Circle and niece who were on the telephone.  We discussed patient's current ED course with planned hospitalization and clarification of goals.  All are in agreement with discontinuing the heparin gtt and transitioning the patient back on Eliquis with transition to [facility] to address her agitation and pain.  The families goal is to get those symptoms managed with a transition back to [facility], however, all present verbalized understanding that Ms. Newman current state of health may likely transition to end of life.     Discussion had with Orpah Melter, Hospice Physician who approved patient IPU eligibility.  Plan is for Ms. Walle to transfer to [facility] from [facility] ED this evening.  Dimas Aguas, RN [employer]

## 2021-10-31 NOTE — ED Notes (Signed)
Marcene Brawn, RN with [facility] at bedside, care discussed with family, decision made to transition to [facility].

## 2021-10-31 NOTE — TOC CAGE-AID Note (Signed)
Transition of Care [facility]) - CAGE-AID Screening   Patient Details  Name: Judy [name] MRN: [id number] Date of Birth: [birth year]  Transition of Care [facility]) CM/SW Contact:    Army Melia, RN Phone Number: 10/31/2021, 5:13 AM    CAGE-AID Screening: Substance Abuse Screening unable to be completed due to: : Patient unable to participate (GCS 10)

## 2021-10-31 NOTE — Progress Notes (Signed)
[place] for Heparin Indication: pulmonary embolus  Allergies  Allergen Reactions   Amoxil [Amoxicillin] Nausea Only    NOT ON MAR   Codeine Nausea Only    NOT ON MAR   Aricept [Donepezil Hcl] Other (See Comments)    "Felt drunk" Headaches Drowsiness  NOT ON MAR   Catapres [Clonidine] Other (See Comments)    Dry mouth  NOT ON MAR   Hycodan [Hydrocodone Bit-Homatrop Mbr] Diarrhea and Nausea And Vomiting    NOT ON MAR   Norco [Hydrocodone-Acetaminophen] Diarrhea and Nausea And Vomiting   Norvasc [Amlodipine] Swelling    NOT ON MAR   Prolia [Denosumab] Other (See Comments)    Unknown reaction NOT ON MAR   Ultram [Tramadol] Nausea Only    NOT ON MAR   Zoloft [Sertraline] Nausea Only    NOT ON MAR   Clavulanic Acid Hives and Rash    NOT ON MAR    Patient Measurements: Height: _0  (162.6 cm) Weight: 56.5 kg (124 lb 9 oz) IBW/kg (Calculated) : 54.7   Vital Signs: Temp: 97.5 F (36.4 C) (09/17 1025) Temp Source: Axillary (09/17 1025) BP: 151/90 (09/17 1345) Pulse Rate: 106 (09/17 1345)  Labs: Recent Labs    10/30/21 2246 10/30/21 2308 10/31/21 0435 10/31/21 0452 10/31/21 1224  HGB 9.8* 11.2* 9.7* 9.5*  --   HCT 30.5* 33.0* 32.6* 28.0*  --   PLT 440*  --  394  --   --   APTT  --   --   --   --  108*  LABPROT 16.7*  --   --   --   --   INR 1.4*  --   --   --   --   HEPARINUNFRC  --   --   --   --  >1.10*  CREATININE 0.92 0.70 0.78  --   --   CKTOTAL 81  --   --   --   --   CKMB 6.1*  --   --   --   --   TROPONINIHS  --   --  89*  --   --      Estimated Creatinine Clearance: 41.2 mL/min (by C-G formula based on SCr of 0.78 mg/dL).   Medical History: Past Medical History:  Diagnosis Date   Blood transfusion    Breast cancer, left ([place]) 09/26/2011   Punch biopsies of the left nipple done on 09/16/2011.  ER and PR were negative.  HER2 is positive with ratio 4.68.Had mastectomy on 10/25/11. Path T1,N0.     Chronic low back pain    Dyslipidemia    GERD (gastroesophageal reflux disease)    HTN (hypertension)    Osteoarthritis    Osteoporosis      Assessment: 88 year old female presenting from facility due to altered mental status and unwitnessed fall approximately 5 hours prior to arrival. Head CT clear of bleed. Patient with history of a fib and bilateral PE/right lower extremity DVT diagnosed 09/2021 on Eliquis.  Pharmacy asked to start Heparin for recent PE diagnosis. Last Eliquis dose not known.  Initial aPTT slightly supratherapeutic at 108 seconds on 900 units/hr, Anti-Xa level elevated as expected with Eliquis dose  Goal of Therapy:  Heparin level 0.3-0.7 units/ml aPTT 66-102 seconds Monitor platelets by anticoagulation protocol: Yes   Plan:  Decrease heparin gtt to 850 units/hr Daily aPTT/Anti-Xa level, CBC, s/s bleeding  Bertis Ruddy, PharmD Clinical Pharmacist ED Pharmacist Phone # [phone]  10/31/2021 2:09 PM

## 2021-10-31 NOTE — ED Notes (Signed)
Pt agitated and moaning. Pt not able to form words. Pt readjusted in bed by this RN and Dorcas Mcmurray NT. Pt placed on purewick.

## 2021-10-31 NOTE — Progress Notes (Signed)
Brief Palliative Medicine Progress Note:  PMT consult received and chart reviewed.   Noted Judy [name] is a current hospice patient with [facility] ([place] I spoke with [facility] liaison, Shanita, who is aware the patient is in the ED. An [facility] representative has discussed [place] with patient's family as they have an established relationship. [facility] does not feel PMT involvement is needed at this time and state patient will be GIP admission.  [facility] will reach out to PMT directly if any needs arise.   Thank you for allowing PMT to assist in the care of this patient.  Klyde Banka M. Tamala Julian [facility] Palliative Medicine Team Team Phone: [phone] NO CHARGE

## 2021-10-31 NOTE — Progress Notes (Signed)
   10/30/21 2245  Clinical Encounter Type  Visited With Patient  Visit Type ED;Trauma;Initial  Referral From Nurse   [facility] paged for Level II trauma, fall on thinners.  Pt. being attended by medical team when [facility] arrived, crying out in apparent distress but not able to verbalize the cause of distress.  [place] checked back w/pt. later and unsuccessfully tried to engage her in conversation.  Pt. eventually calmed after RN brought her several warm blankets.

## 2021-10-31 NOTE — ED Notes (Signed)
Trauma Response Nurse Documentation   Judy [name] is a 88 y.o. female arriving to [facility] ED via EMS  On Eliquis (apixaban) daily for PE. Trauma was activated as a Level 2 by ED charge RN based on the following trauma criteria Elderly patients > 65 with head trauma on anti-coagulation (excluding ASA). Trauma team at the bedside on patient arrival.   Patient cleared for CT by Dr. Wyvonnia Dusky EDP. Pt transported to CT with trauma response nurse present to monitor. RN remained with the patient throughout their absence from the department for clinical observation.   GCS 10, baseline 15.  History   Past Medical History:  Diagnosis Date   Blood transfusion    Breast cancer, left ([place]) 09/26/2011   Punch biopsies of the left nipple done on 09/16/2011.  ER and PR were negative.  HER2 is positive with ratio 4.68.Had mastectomy on 10/25/11. Path T1,N0.    Chronic low back pain    Dyslipidemia    GERD (gastroesophageal reflux disease)    HTN (hypertension)    Osteoarthritis    Osteoporosis      Past Surgical History:  Procedure Laterality Date   BACK SURGERY  2009   lumb lam   BREAST SURGERY  2009   lt lumpectomy-plus 2 re-excisions for margins   left breast mastectomy  2013   MASTECTOMY Left    ROTATOR CUFF REPAIR     Bilateral, 2010   TOTAL ABDOMINAL HYSTERECTOMY         Initial Focused Assessment (If applicable, or please see trauma documentation): Confused female presents via EMS from SNF after a fall at 1700 (EMS was not called for 7 hours??). Known hx of PEs, on Eliquis for same. LSW on Wednesday, usually GCS 15 Airway patent/unobstructed, BS clear No obvious uncontrolled hemorrhage, large bruise/hematoma to left hip, hematoma to posterior head GCS 10(E4V2M4) PERRLA sluggish 44m  CT's Completed:   CT Head, CT C-Spine, and CT abdomen/pelvis w/ contrast   Interventions:  CTs as above Portable chest, pelvis and left knee XRAY IV start and trauma lab draw Ativan for  agitation In and out cath for urinalysis Rocephin, zithromax for CAP Diltiazem for AFIB EKG  Plan for disposition:  Admission to Progressive Care   Consults completed:  Hospitalist at [place]  Event Summary: Arrives via EMS from SNF after an unwitnessed fall. Large hematoma and bruise to left hip. Hematoma to posterior head. Yelling on arrival, unable to redirect, unable to follow commands. LSW Wednesday, fell at 1700 and EMS was not called for several hours.   MTP Summary (If applicable): NA  Bedside handoff with ED RN RApolonio Schneiders    Riverlyn Kizziah O Eann Cleland  Trauma Response RN  Please call TRN at [phone] further assistance.

## 2021-10-31 NOTE — ED Notes (Signed)
Patient placed on hospital bed for comfort.  Patient continues to get agitated when care being provided.

## 2021-11-03 LAB — LEGIONELLA PNEUMOPHILA SEROGP 1 UR AG: L. pneumophila Serogp 1 Ur Ag: NEGATIVE

## 2021-11-09 ENCOUNTER — Ambulatory Visit: Attending: General Practice | Admitting: Physician Assistant

## 2021-11-09 DIAGNOSIS — I2699 Other pulmonary embolism without acute cor pulmonale: Secondary | ICD-10-CM

## 2021-11-09 DIAGNOSIS — I10 Essential (primary) hypertension: Secondary | ICD-10-CM

## 2021-11-09 DIAGNOSIS — I4891 Unspecified atrial fibrillation: Secondary | ICD-10-CM

## 2021-11-14 NOTE — Progress Notes (Signed)
Office Visit    Patient Name: Judy [name] Date of Encounter: 11/08/2021  PCP:  Pcp, No   Prosser  Cardiologist:  Pixie Casino, MD  Advanced Practice Provider:  No care team member to display Electrophysiologist:  None    HPI    Judy [name] is a 88 y.o. female with past medical history significant for hypertension which was well controlled until 10/13 she had a mastectomy for Paget's disease presents today for hospital follow-up.  She has not been seen here in the office since 2014.  At that time her blood pressure readings have been in the 180/100 range she only took clonidine at night was having some problems with increased fatigue and dry mouth.  Dr. Emeterio Reeve stopped clonidine and restarted the Norvasc 5 mg every day which she was tolerating well.  She was then increased to amlodipine 10 mg daily.  She is here for hospital follow-up.  10/30/2021 she had altered mental status and fall and was discovered to have new onset atrial fibrillation with RVR.  CT scan revealed a similar appearing occlusive right main pulmonary embolus that extended to the right middle and right lower lobes with additional stable clot burden as well as interval development of masslike right apical airspace opacity concerning for infection versus pulmonary infarct.  She was restarted on diltiazem drip for her atrial fibrillation with RVR.  Heparin was initiated for pulmonary embolism in setting of needing anticoagulation but also with a soft tissue hematoma.  Discharged on diltiazem and Eliquis 5 mg twice daily.     Past Medical History    Past Medical History:  Diagnosis Date   Blood transfusion    Breast cancer, left ([place]) 09/26/2011   Punch biopsies of the left nipple done on 09/16/2011.  ER and PR were negative.  HER2 is positive with ratio 4.68.Had mastectomy on 10/25/11. Path T1,N0.    Chronic low back pain    Dyslipidemia    GERD (gastroesophageal reflux disease)    HTN  (hypertension)    Osteoarthritis    Osteoporosis    Past Surgical History:  Procedure Laterality Date   BACK SURGERY  2009   lumb lam   BREAST SURGERY  2009   lt lumpectomy-plus 2 re-excisions for margins   left breast mastectomy  2013   MASTECTOMY Left    ROTATOR CUFF REPAIR     Bilateral, 2010   TOTAL ABDOMINAL HYSTERECTOMY      Allergies  Allergies  Allergen Reactions   Amoxil [Amoxicillin] Nausea Only    NOT ON MAR   Codeine Nausea Only    NOT ON MAR   Aricept [Donepezil Hcl] Other (See Comments)    "Felt drunk" Headaches Drowsiness  NOT ON MAR   Catapres [Clonidine] Other (See Comments)    Dry mouth  NOT ON MAR   Hycodan [Hydrocodone Bit-Homatrop Mbr] Diarrhea and Nausea And Vomiting    NOT ON MAR   Norco [Hydrocodone-Acetaminophen] Diarrhea and Nausea And Vomiting   Norvasc [Amlodipine] Swelling    NOT ON MAR   Prolia [Denosumab] Other (See Comments)    Unknown reaction NOT ON MAR   Ultram [Tramadol] Nausea Only    NOT ON MAR   Zoloft [Sertraline] Nausea Only    NOT ON MAR   Clavulanic Acid Hives and Rash    NOT ON MAR    EKGs/Labs/Other Studies Reviewed:   The following studies were reviewed today:  Echocardiogram 08/02/21  IMPRESSIONS  1. Left ventricular ejection fraction, by estimation, is 40 to 45%. The  left ventricle has mildly decreased function. The left ventricle  demonstrates global hypokinesis. There is moderate concentric left  ventricular hypertrophy. Left ventricular  diastolic parameters are indeterminate. Regional wall thickness 0.72, E/e'  9.   2. Right ventricular systolic function is normal. The right ventricular  size is normal. There is mildly elevated pulmonary artery systolic  pressure. The estimated right ventricular systolic pressure is 50.0 mmHg.   3. Left atrial size was moderately dilated.   4. The mitral valve is degenerative. Trivial mitral valve regurgitation.  Mitral valve area by continuity equation 1.95  cm2   5. Tricuspid valve regurgitation is moderate.   6. The aortic valve is tricuspid. There is mild calcification of the  aortic valve. There is moderate thickening of the aortic valve. Aortic  valve regurgitation is not visualized. Aortic valve sclerosis is present,  with no evidence of aortic valve  stenosis. Aortic valve mean gradient measures 3.0 mmHg.   Comparison(s): No prior Echocardiogram.   Conclusion(s)/Recommendation(s): Notable hypertrophy with left atrial  dilation, left atrial enlargement. Outpatient Pyrophosphate imaging could  be considered.   CTAngio 09/20/21  IMPRESSION: 1. Large central and segmental bilateral pulmonary emboli, most pronounced within the right main pulmonary artery as above. CT evidence of right heart strain (RV/LV Ratio = 1.48) consistent with at least submassive (intermediate risk) PE. The presence of right heart strain has been associated with an increased risk of morbidity and mortality. Please refer to the "Code PE Focused" order set in EPIC. 2. Dense right middle and right lower lobe consolidation most consistent with pulmonary infarct given central pulmonary emboli. 3. Moderate right pleural effusion measures less than 1 L. 4.  Aortic Atherosclerosis (ICD10-I70.0). 5. Cholelithiasis.  EKG:  EKG is  ordered today.  The ekg ordered today demonstrates   Recent Labs: 10/30/2021: ALT 20 10/31/2021: B Natriuretic Peptide 1,157.8; BUN 26; Creatinine, Ser 0.78; Hemoglobin 9.5; Magnesium 1.9; Platelets 394; Potassium 3.5; Sodium 140; TSH 8.147  Recent Lipid Panel No results found for: "CHOL", "TRIG", "HDL", "CHOLHDL", "VLDL", "LDLCALC", "LDLDIRECT"  Risk Assessment/Calculations:   CHA2DS2-VASc Score = 5   This indicates a 7.2% annual risk of stroke. The patient's score is based upon: CHF History: 0 HTN History: 1 Diabetes History: 0 Stroke History: 0 Vascular Disease History: 1 Age Score: 2 Gender Score: 1     Home Medications    No outpatient medications have been marked as taking for the 10/21/2021 encounter (Office Visit) with Elgie Collard, PA-C.     Review of Systems      All other systems reviewed and are otherwise negative except as noted above.  Physical Exam    VS:  There were no vitals taken for this visit. , BMI There is no height or weight on file to calculate BMI.  Wt Readings from Last 3 Encounters:  10/30/21 124 lb 9 oz (56.5 kg)  10/12/21 124 lb 9 oz (56.5 kg)  09/23/21 142 lb 3.2 oz (64.5 kg)      Assessment & Plan    New onset atrial fibrillation with RVR Hypertension  PE    Disposition: Follow up  with Pixie Casino, MD or APP.  Signed, Elgie Collard, PA-C 11/08/2021, 2:48 PM [facility]

## 2021-11-14 DEATH — deceased

## 2021-11-16 ENCOUNTER — Ambulatory Visit: Payer: Medicare Other

## 2021-12-03 ENCOUNTER — Inpatient Hospital Stay: Payer: Medicare Other | Admitting: Pulmonary Disease
